# Patient Record
Sex: Male | Born: 1950 | Race: White | Hispanic: No | Marital: Married | Smoking: Former smoker
Health system: Southern US, Community
[De-identification: ages and names within clinical notes are randomized; demographics above are authoritative.]

## PROBLEM LIST (undated history)

## (undated) DIAGNOSIS — I10 Essential (primary) hypertension: Secondary | ICD-10-CM

## (undated) DIAGNOSIS — K635 Polyp of colon: Secondary | ICD-10-CM

## (undated) DIAGNOSIS — K760 Fatty (change of) liver, not elsewhere classified: Secondary | ICD-10-CM

## (undated) DIAGNOSIS — D689 Coagulation defect, unspecified: Secondary | ICD-10-CM

## (undated) DIAGNOSIS — E785 Hyperlipidemia, unspecified: Secondary | ICD-10-CM

## (undated) DIAGNOSIS — I2510 Atherosclerotic heart disease of native coronary artery without angina pectoris: Secondary | ICD-10-CM

## (undated) DIAGNOSIS — H269 Unspecified cataract: Secondary | ICD-10-CM

## (undated) DIAGNOSIS — K648 Other hemorrhoids: Secondary | ICD-10-CM

## (undated) DIAGNOSIS — R945 Abnormal results of liver function studies: Principal | ICD-10-CM

## (undated) DIAGNOSIS — R55 Syncope and collapse: Secondary | ICD-10-CM

## (undated) DIAGNOSIS — D649 Anemia, unspecified: Secondary | ICD-10-CM

## (undated) DIAGNOSIS — I214 Non-ST elevation (NSTEMI) myocardial infarction: Secondary | ICD-10-CM

## (undated) DIAGNOSIS — R7989 Other specified abnormal findings of blood chemistry: Secondary | ICD-10-CM

## (undated) DIAGNOSIS — K5790 Diverticulosis of intestine, part unspecified, without perforation or abscess without bleeding: Secondary | ICD-10-CM

## (undated) DIAGNOSIS — I4891 Unspecified atrial fibrillation: Secondary | ICD-10-CM

## (undated) DIAGNOSIS — M1990 Unspecified osteoarthritis, unspecified site: Secondary | ICD-10-CM

## (undated) DIAGNOSIS — K921 Melena: Secondary | ICD-10-CM

## (undated) HISTORY — DX: Fatty (change of) liver, not elsewhere classified: K76.0

## (undated) HISTORY — PX: COLONOSCOPY: SHX174

## (undated) HISTORY — DX: Syncope and collapse: R55

## (undated) HISTORY — DX: Melena: K92.1

## (undated) HISTORY — PX: WISDOM TOOTH EXTRACTION: SHX21

## (undated) HISTORY — PX: MOUTH SURGERY: SHX715

## (undated) HISTORY — DX: Non-ST elevation (NSTEMI) myocardial infarction: I21.4

## (undated) HISTORY — DX: Unspecified atrial fibrillation: I48.91

## (undated) HISTORY — DX: Polyp of colon: K63.5

## (undated) HISTORY — DX: Atherosclerotic heart disease of native coronary artery without angina pectoris: I25.10

## (undated) HISTORY — DX: Unspecified cataract: H26.9

## (undated) HISTORY — DX: Anemia, unspecified: D64.9

## (undated) HISTORY — DX: Essential (primary) hypertension: I10

## (undated) HISTORY — DX: Other hemorrhoids: K64.8

## (undated) HISTORY — DX: Other specified abnormal findings of blood chemistry: R79.89

## (undated) HISTORY — DX: Unspecified osteoarthritis, unspecified site: M19.90

## (undated) HISTORY — DX: Diverticulosis of intestine, part unspecified, without perforation or abscess without bleeding: K57.90

## (undated) HISTORY — PX: ESOPHAGOGASTRODUODENOSCOPY ENDOSCOPY: SHX5814

## (undated) HISTORY — DX: Abnormal results of liver function studies: R94.5

## (undated) HISTORY — DX: Coagulation defect, unspecified: D68.9

## (undated) HISTORY — DX: Hyperlipidemia, unspecified: E78.5

---

## 2011-03-17 ENCOUNTER — Inpatient Hospital Stay (HOSPITAL_COMMUNITY)
Admission: EM | Admit: 2011-03-17 | Discharge: 2011-03-19 | DRG: 121 | Disposition: A | Discharge status: Home / Self Care | Payer: BC Managed Care – PPO | Source: Ambulatory Visit | Attending: Internal Medicine | Admitting: Internal Medicine

## 2011-03-17 ENCOUNTER — Emergency Department (HOSPITAL_COMMUNITY): Payer: BC Managed Care – PPO

## 2011-03-17 ENCOUNTER — Inpatient Hospital Stay (HOSPITAL_COMMUNITY): Payer: BC Managed Care – PPO

## 2011-03-17 DIAGNOSIS — R7989 Other specified abnormal findings of blood chemistry: Secondary | ICD-10-CM | POA: Diagnosis present

## 2011-03-17 DIAGNOSIS — I10 Essential (primary) hypertension: Secondary | ICD-10-CM | POA: Diagnosis present

## 2011-03-17 DIAGNOSIS — E785 Hyperlipidemia, unspecified: Secondary | ICD-10-CM | POA: Diagnosis present

## 2011-03-17 DIAGNOSIS — R197 Diarrhea, unspecified: Secondary | ICD-10-CM | POA: Diagnosis present

## 2011-03-17 DIAGNOSIS — I4891 Unspecified atrial fibrillation: Secondary | ICD-10-CM | POA: Diagnosis present

## 2011-03-17 DIAGNOSIS — K649 Unspecified hemorrhoids: Secondary | ICD-10-CM | POA: Diagnosis present

## 2011-03-17 DIAGNOSIS — I2510 Atherosclerotic heart disease of native coronary artery without angina pectoris: Secondary | ICD-10-CM | POA: Diagnosis present

## 2011-03-17 DIAGNOSIS — R55 Syncope and collapse: Secondary | ICD-10-CM

## 2011-03-17 DIAGNOSIS — I214 Non-ST elevation (NSTEMI) myocardial infarction: Principal | ICD-10-CM | POA: Diagnosis present

## 2011-03-17 LAB — CBC
HCT: 43.3 % (ref 39.0–52.0)
Hemoglobin: 15.4 g/dL (ref 13.0–17.0)
MCH: 29.5 pg (ref 26.0–34.0)
MCH: 29.1 pg (ref 26.0–34.0)
MCHC: 34.6 g/dL (ref 30.0–36.0)
MCV: 84 fL (ref 78.0–100.0)
Platelets: 205 10*3/uL (ref 150–400)
Platelets: 214 10*3/uL (ref 150–400)
RBC: 4.7 MIL/uL (ref 4.22–5.81)
RDW: 13.3 % (ref 11.5–15.5)
RDW: 13.2 % (ref 11.5–15.5)
WBC: 12.9 10*3/uL — ABNORMAL HIGH (ref 4.0–10.5)
WBC: 15.2 10*3/uL — ABNORMAL HIGH (ref 4.0–10.5)

## 2011-03-17 LAB — POCT I-STAT TROPONIN I

## 2011-03-17 LAB — CK TOTAL AND CKMB (NOT AT ARMC)
CK, MB: 27.3 ng/mL (ref 0.3–4.0)
Relative Index: 8.4 — ABNORMAL HIGH (ref 0.0–2.5)
Total CK: 324 U/L — ABNORMAL HIGH (ref 7–232)

## 2011-03-17 LAB — HEPATIC FUNCTION PANEL
ALT: 126 U/L — ABNORMAL HIGH (ref 0–53)
AST: 141 U/L — ABNORMAL HIGH (ref 0–37)
Total Protein: 7.9 g/dL (ref 6.0–8.3)

## 2011-03-17 LAB — PROTIME-INR: INR: 1.1 (ref 0.00–1.49)

## 2011-03-17 LAB — DIFFERENTIAL
Basophils Absolute: 0 10*3/uL (ref 0.0–0.1)
Lymphocytes Relative: 16 % (ref 12–46)
Neutro Abs: 10 10*3/uL — ABNORMAL HIGH (ref 1.7–7.7)

## 2011-03-17 LAB — BASIC METABOLIC PANEL
Chloride: 101 mEq/L (ref 96–112)
GFR calc Af Amer: 72 mL/min — ABNORMAL LOW (ref >90)
Potassium: 3.7 mEq/L (ref 3.5–5.1)
Sodium: 138 mEq/L (ref 135–145)

## 2011-03-17 LAB — CARDIAC PANEL(CRET KIN+CKTOT+MB+TROPI)
CK, MB: 60.8 ng/mL (ref 0.3–4.0)
Total CK: 562 U/L — ABNORMAL HIGH (ref 7–232)
Troponin I: 16.79 ng/mL (ref <0.30)

## 2011-03-17 LAB — LIPID PANEL
HDL: 29 mg/dL — ABNORMAL LOW (ref >39)
LDL Cholesterol: 199 mg/dL — ABNORMAL HIGH (ref 0–99)

## 2011-03-17 LAB — ETHANOL
Alcohol, Ethyl (B): <11 mg/dL (ref 0–11)
Alcohol, Ethyl (B): <11 mg/dL (ref 0–11)

## 2011-03-18 DIAGNOSIS — I2510 Atherosclerotic heart disease of native coronary artery without angina pectoris: Secondary | ICD-10-CM

## 2011-03-18 HISTORY — PX: CARDIAC CATHETERIZATION: SHX172

## 2011-03-18 LAB — CBC
HCT: 39.5 % (ref 39.0–52.0)
Hemoglobin: 13.5 g/dL (ref 13.0–17.0)
MCH: 29.2 pg (ref 26.0–34.0)
MCHC: 34.2 g/dL (ref 30.0–36.0)
MCV: 85.3 fL (ref 78.0–100.0)

## 2011-03-18 NOTE — Cardiovascular Report (Signed)
NAMEMITESH, ROSENDAHL                 ACCOUNT NO.:  [id number]  MEDICAL RECORD NO.:  [id number]  LOCATION:  3704                         FACILITY:  [facility]  PHYSICIAN:  Verne Carrow, MDDATE OF BIRTH:  1950/09/24  DATE OF PROCEDURE:  03/18/2011 DATE OF DISCHARGE:                           CARDIAC CATHETERIZATION   PRIMARY CARDIOLOGIST:  Doylene Canning. Ladona Ridgel, MD  PROCEDURE PERFORMED: 1. Left heart catheterization. 2. Selective coronary angiography. 3. Left ventricular angiogram.  OPERATOR:  Verne Carrow, MD  INDICATION:  This is a 60 year old Caucasian male with no significant past medical history who presented to the hospital yesterday after a syncopal episode.  The patient was found to be in atrial fibrillation with rapid ventricular response.  The patient has also complained of diarrhea over the last several days.  The patient was converted to normal sinus rhythm with intravenous Cardizem.  He was not aware of any irregularity of his heart rhythm.  The patient has had no complaints of chest pain or shortness of breath.  He did rule in for a myocardial infarction with serial cardiac enzymes.  His troponin peak was 16. Diagnostic catheterization was arranged for today.  DETAILS OF PROCEDURE:  The patient was brought to the main cardiac catheterization laboratory after signing informed consent for the procedure.  An Freida Busman test was performed on the right wrist and was positive.  The right wrist was prepped and draped in sterile fashion. Lidocaine 1% was used for local anesthesia.  A 5-French sheath was inserted without difficulty into the right radial artery.  Verapamil 3 mg was given through the sheath after insertion.  Intravenous heparin 4000 units was given after sheath insertion.  Standard diagnostic catheters were used to perform selective coronary angiography.  Pigtail catheter was used to perform a left ventricular angiogram.  The patient tolerated the procedure  well.  The sheath was removed from the right radial artery and a Terumo hemostasis band was applied to the arteriotomy site.  The patient was taken to the recovery area in stable condition.  HEMODYNAMIC FINDINGS:  Central aortic pressure 130/82, left ventricular pressure 121/14/22.  ANGIOGRAPHIC FINDINGS: 1. The left main coronary artery had no evidence of disease. 2. The left anterior descending was a large vessel that coursed to the     apex.  There was a moderate-sized diagonal branch that had 30%     plaque.  The mid and distal LAD beyond the diagonal branch became     smaller in caliber and had no significant disease. 3. The circumflex artery had mild plaque in its proximal portion.     There was a large marginal branch that had 30% plaque.  The second     marginal branch was moderate sized and had 30% plaque.  The AV     groove circumflex was relatively small in caliber beyond the     takeoff of the second marginal branch and had no disease. 4. The right coronary artery is a large dominant vessel with 30%     proximal plaque. 5. Left ventricular angiogram was performed in the RAO projection and     showed mild left ventricular systolic dysfunction with hypokinesis  of the anterolateral wall and apex.  IMPRESSION: 1. Mild nonobstructive coronary artery disease. 2. Non-ST elevation myocardial infarction in the setting of atrial     fibrillation with rapid ventricular response.  I do not see any     focal coronary stenoses that would explain his elevated troponin.     He does have a wall motion abnormality in the anteroapical wall.     It could be secondary to myocarditis versus coronary thrombus that     has since resolved versus stress-induced cardiomyopathy.  At this     time, we will treat him as if he is in acute coronary syndrome with     aspirin, Plavix, beta-blocker, and a statin.  In regards to his     atrial fibrillation, I do not think that he will require  Coumadin     therapy at this time.  He is currently in sinus rhythm.  We will     watch him closely for 24 or more hours.  We will probably discharge     him tomorrow.  He can follow up with Dr. Ladona Ridgel in regards to his     atrial fibrillation.     Verne Carrow, MD     CM/MEDQ  D:  03/18/2011  T:  03/18/2011  Job:  [id number]  cc:   Doylene Canning. Ladona Ridgel, MD  Electronically Signed by Verne Carrow MD on 03/18/2011 11:57:24 AM

## 2011-03-19 DIAGNOSIS — I219 Acute myocardial infarction, unspecified: Secondary | ICD-10-CM

## 2011-03-19 HISTORY — PX: TRANSTHORACIC ECHOCARDIOGRAM: SHX275

## 2011-03-19 LAB — HEPATITIS PANEL, ACUTE: Hep A IgM: NEGATIVE

## 2011-03-19 LAB — BASIC METABOLIC PANEL
BUN: 13 mg/dL (ref 6–23)
Chloride: 103 mEq/L (ref 96–112)
Creatinine, Ser: 1.21 mg/dL (ref 0.50–1.35)
GFR calc Af Amer: 74 mL/min — ABNORMAL LOW (ref >90)
GFR calc non Af Amer: 64 mL/min — ABNORMAL LOW (ref >90)
Glucose, Bld: 102 mg/dL — ABNORMAL HIGH (ref 70–99)
Potassium: 3.9 mEq/L (ref 3.5–5.1)

## 2011-03-19 LAB — CBC
HCT: 44.2 % (ref 39.0–52.0)
Hemoglobin: 14.4 g/dL (ref 13.0–17.0)
MCHC: 32.6 g/dL (ref 30.0–36.0)
MCV: 87 fL (ref 78.0–100.0)
RDW: 13.2 % (ref 11.5–15.5)

## 2011-03-19 LAB — HEPATIC FUNCTION PANEL
ALT: 111 U/L — ABNORMAL HIGH (ref 0–53)
AST: 88 U/L — ABNORMAL HIGH (ref 0–37)
Bilirubin, Direct: <0.1 mg/dL (ref 0.0–0.3)
Total Bilirubin: 0.4 mg/dL (ref 0.3–1.2)

## 2011-03-21 NOTE — Discharge Summary (Signed)
Timothy Estes, Timothy Estes NO.:  [id number]  MEDICAL RECORD NO.:  [id number]  LOCATION:  2502                         FACILITY:  [facility]  PHYSICIAN:  Luis Abed, MD, FACCDATE OF BIRTH:  07/12/1950  DATE OF ADMISSION:  03/17/2011 DATE OF DISCHARGE:  03/19/2011                              DISCHARGE SUMMARY  PRIMARY CARDIOLOGIST: Dr. Lewayne Bunting   REASON FOR ADMISSION:  Syncope and elevated cardiac markers in the setting of atrial fibrillation with rapid ventricular rate.  DISCHARGE DIAGNOSES: 1. Non-ST-elevation myocardial infarction. 2. a.  Thought to be secondary to possible coronary embolus from     atrial fibrillation - possible myocarditis. 3. Nonobstructive coronary artery disease by cardiac catheterization     this admission. 4. Mild left ventricular dysfunction with an ejection fraction of 40%-     45% by cardiac catheterizations this admission. 5. Paroxysmal atrial fibrillation. 6. a.  Converted to normal sinus rhythm this admission. 7. Elevated LFTs. 8. a.  Abdominal ultrasound this admission:  Increased echogenicity of     the liver, likely representing steatosis, otherwise no acute     abnormalities. 9. Hypertension. 10.Hyperlipidemia. 11. Hematochezia likely secondary to hemorrhoidal bleeding  PROCEDURES PERFORMED THIS ADMISSION:  Cardiac catheterization by Dr. Verne Carrow, March 18, 2011:  EF 40%-45% with anterolateral hypokinesis, LAD with proximal plaque, OM-1 30%, proximal RCA 30%.  ADMISSION HISTORY:  Timothy Estes is a 60 year old male who presented to the emergency room after suffering a syncopal episode on his way to the bathroom during the middle of the night.  He had some diarrhea.  EMS was summoned, and he was noted to be in atrial fibrillation with rapid ventricular rate.  He had more diarrhea in the emergency room with some blood in it.  He does have a history of hemorrhoidal bleeding.  He was placed on IV Cardizem, and  he converted to normal sinus rhythm.  His cardiac markers were noted to be elevated with a troponin of 2.82.  He was admitted for further evaluation and treatment.  HOSPITAL COURSE:  The patient's cardiac enzymes continued to rise, ruling him in for a non-ST-elevation myocardial infarction.  Peak troponin was 16.79.    He was noted to have elevated LFTs with a bilirubin of 0.4, AST 141, and ALT 126.  He did undergo an abdominal ultrasound that demonstrated findings consistent with steatosis, but no other acute process or explanation for elevated LFTs.    He underwent cardiac catheterization by Dr. Clifton James on March 18, 2011.   As noted above, he had nonobstructive disease.  He had mild LV dysfunction.   He had anterolateral hypokinesis.  It was felt that his non-ST-elevation myocardial infarction is possibly secondary to coronary embolus from his atrial fibrillation.  It was also thought he may have possible myocarditis.  It was felt that troponin was too high for Tako-tsubo cardiomyopathy.  He was placed on aspirin and Plavix for his ACS.  He was also placed on beta-blocker and statin.    However, at discharge, with his elevated liver enzyme it was felt that his statin should be held.  He will have followup liver function tests as well as  acute hepatitis panel drawn before discharge today.  We will also recheck LFTs in the office next week.  If his LFTs normalize, we will try to initiate statin therapy as his LDL was significantly elevated.    Of note, Dr. Clifton James felt that the patient should follow up with Dr. Ladona Ridgel for  further management of his atrial fibrillation.  He remains in sinus rhythm.  At  this point, it was not felt that Coumadin should be initiated.  He was evaluated by Dr. Myrtis Ser on March 19, 2011 and felt to be stable enough for discharge to home.    LABORATORIES AND X-RAY DATA:  Hemoglobin 14.4.  Sodium 138, potassium 3.9, creatinine 1.21.  Total bilirubin  0.4, direct bilirubin less than 0.1, alkaline phosphatase 72, AST 141, ALT 126, total protein 10.9, albumin 3.6, calcium 9.5, peak CK-MB 60.8, peak troponin-I 16.79.  Total cholesterol 277, triglycerides 247, HDL 29, LDL 199.  TSH 1.763. Abdominal ultrasound as noted above demonstrated findings consistent with steatosis, but no other significant findings.  Chest x-ray: Borderline cardiomegaly.  No acute cardiopulmonary process.  Head CT upon presentation to the ER demonstrated no evidence of traumatic intracranial injury or fracture.  CT of the cervical spine demonstrated no evidence of fracture or subluxation.  There was chronic osseous fusion at C5-C6 upon anterior and posterior, disk osteophyte complex is noted along the cervical spine, narrowing of the spinal canal 6 mm in maximal AP dimension on sagittal images at C5-C6 due to osteophytes and mild calcification in the carotid bifurcations bilaterally.  DISCHARGE MEDICATIONS: 1. Acetaminophen 325 mg 2 tablets every 4 hours as needed. 2. Aspirin 81 mg daily. 3. Clopidogrel 75 mg daily. 4. Metoprolol 25 mg twice daily. 5. Nitroglycerin p.r.n. chest pain. 6. Tums as needed. He has been advised to stop taking Aleve for now.  ALLERGIES:  No known drug allergies.  ACTIVITY:  He is to increase activity slowly.  No lifting or sexual activity for 2 weeks.  No driving for 1 week.  He may shower.  DIET:  Low-fat, low-sodium diet.  WOUND CARE:  He should call our office for any swelling, bleeding, bruising or fever.  FOLLOWUP: 1. Followup will be with Dr. Ladona Ridgel or the physician assistant in 2     weeks and our office will contact with an appointment. 2. A 2-D echocardiogram will be arranged as an outpatient and the     office will contact him with an appointment to reassess his LV     function 3. Followup liver function test will be arranged in the office early     next week, and he will be contacted with an appointment.  Total  physician PA time greater than 30 minutes since discharge.     Tereso Newcomer, PA-C   ______________________________ Luis Abed, MD, [facility]    SW/MEDQ  D:  03/19/2011  T:  03/19/2011  Job:  [id number]  Electronically Signed by Tereso Newcomer PA-C on 03/20/2011 02:34:31 PM Electronically Signed by Willa Rough MD FACC on 03/21/2011 01:23:45 PM

## 2011-03-21 NOTE — Discharge Summary (Signed)
  NAMEBAYLOR, CORTEZ                 ACCOUNT NO.:  [id number]  MEDICAL RECORD NO.:  [id number]  LOCATION:  2502                         FACILITY:  [facility]  PHYSICIAN:  Luis Abed, MD, FACCDATE OF BIRTH:  November 03, 1950  DATE OF ADMISSION:  03/17/2011 DATE OF DISCHARGE:  03/19/2011                              DISCHARGE SUMMARY   ADDENDUM:  The patient is actually getting his echo cardiogram prior to discharge today.  Therefore it will be done in the hospital and we will follow up on the results when he returns to office in followup.     Tereso Newcomer, PA-C   ______________________________ Luis Abed, MD, [facility]    SW/MEDQ  D:  03/19/2011  T:  03/19/2011  Job:  [id number]  Electronically Signed by Tereso Newcomer PA-C on 03/20/2011 02:29:39 PM Electronically Signed by Willa Rough MD FACC on 03/21/2011 01:23:41 PM

## 2011-03-25 ENCOUNTER — Ambulatory Visit (INDEPENDENT_AMBULATORY_CARE_PROVIDER_SITE_OTHER): Payer: BC Managed Care – PPO | Admitting: *Deleted

## 2011-03-25 DIAGNOSIS — E7800 Pure hypercholesterolemia, unspecified: Secondary | ICD-10-CM

## 2011-03-25 LAB — HEPATIC FUNCTION PANEL
ALT: 92 U/L — ABNORMAL HIGH (ref 0–53)
Albumin: 3.9 g/dL (ref 3.5–5.2)
Alkaline Phosphatase: 60 U/L (ref 39–117)
Bilirubin, Direct: 0 mg/dL (ref 0.0–0.3)
Total Protein: 8.5 g/dL — ABNORMAL HIGH (ref 6.0–8.3)

## 2011-03-27 NOTE — H&P (Signed)
Timothy Estes, Timothy Estes                 ACCOUNT NO.:  [id number]  MEDICAL RECORD NO.:  [id number]  LOCATION:  3703                         FACILITY:  [facility]  PHYSICIAN:  Doylene Canning. Ladona Ridgel, MD    DATE OF BIRTH:  1951-04-10  DATE OF ADMISSION:  03/17/2011 DATE OF DISCHARGE:                             HISTORY & PHYSICAL   PRIMARY CARE PHYSICIAN:  None.  PRIMARY CARDIOLOGIST:  None.  CHIEF COMPLAINT:  Syncope with diarrhea.  HPI:  Timothy Estes is a 60 year old male with no previous cardiac issues. He was in his usual state of health yesterday.  Last night after going to bed early he got up to the bathroom.  When he tried to get up, he slipped off the bed on to the floor.  He remembers doing this and just remembers feeling very weak and like he was going to pass out.  He improved somewhat so he got up and walked to the bathroom.  There he had a syncopal episode and according to his wife was in and out of consciousness.  He also had an episode of diarrhea at that time.  His wife called 9-1-1.  He was transported by EMS and was in AFib with RVR. He was confused and according to his wife kept repeating the same questions over and over again such as what happened.  He was oriented to name and place.  He was in AFib with rapid ventricular response in the emergency room.  He had another episode of diarrhea there as well. There was a small amount of bright red blood in it.  At no time did Timothy Estes complained of chest pain, shortness of breath or have palpitations.  He was placed on IV Cardizem and his heart rate control improved.  He then converted spontaneously to sinus rhythm.  At no time was he aware of an irregular heart rate.  His cardiac enzymes were elevated and Cardiology was asked to evaluate him.  Currently in the emergency room, his heart rate is sinus rhythm, rate in the 70s and 80s, and he is resting comfortably.  PAST MEDICAL HISTORY:  His last physical was in 1997.  He does get  DOT physicals yearly.  They check his cholesterol every other year and 2 years ago he thinks it was okay.  He denies any history of diabetes, hypertension, hyperlipidemia or family history of premature coronary artery disease.  He has no ongoing medical problems of which he is aware.  SURGICAL HISTORY:  None.  ALLERGIES:  None.  CURRENT MEDICATIONS:  Aleve and Tums p.r.n.  SOCIAL HISTORY:  He lives in [place] with his wife.  He works as a [employer], counselling.  He smoked for few years, but quit back in 1976.  He denies any history of alcohol or drug abuse.  FAMILY HISTORY:  Both of his parents were in their mid 71s when they died and neither of his parents nor any siblings have/had any cardiac issues.  REVIEW OF SYSTEMS:  He was diaphoretic at the time of his syncope.  He gets occasional reflux symptoms, but they resolve quickly with Tums.  He has bright red blood per rectum about  once a week, but it is a very small amount.  He has a history of hemorrhoids.  He denies any history of chest pain, shortness of breath, or palpitations.  Until today he had not been having any nausea, vomiting, or diarrhea.  He has occasional arthralgias or joint pains.  Full 14-point review of systems is otherwise negative except as stated in the HPI.  PHYSICAL EXAMINATION:  VITAL SIGNS:  Temperature is 97.3, blood pressure 149/95, heart rate 106, respiratory rate 11, O2 saturation 97% on room air. GENERAL:  He is a well-developed, well-nourished, white male in no acute distress. HEENT:  Normal for age. NECK:  There is no lymphadenopathy, thyromegaly, bruit or JVD noted. CV:  His heart is regular in rate and rhythm with an S1, S2 and no significant murmur, rub, or gallop is noted.  Distal pulses are intact in all four extremities. LUNGS:  Clear to auscultation bilaterally. SKIN:  No rashes or lesions are noted. ABDOMEN:  Soft and nontender with active bowel sounds. EXTREMITIES:  There  is no cyanosis, clubbing, or edema noted. RECTAL:  Stool guaiac was not performed, but per the emergency room physician red blood was obvious in his bowel movement. MUSCULOSKELETAL:  There is no joint deformity or effusions and no spine or CVA tenderness. NEUROLOGIC:  He is alert and oriented.  Cranial nerves II through XII grossly intact.  DIAGNOSTIC STUDIES:  Chest x-ray shows borderline cardiomegaly, but no acute cardiopulmonary process.  EKG initially atrial fibrillation, heart rate 95 with diffuse upsloping ST depression in the inferolateral leads.  Repeat EKG shows sinus rhythm with again diffuse inferolateral ST-depression.  LABORATORY VALUES:  Hemoglobin 15.4, hematocrit 43.3, WBCs 12.9, platelets 209, INR 1.0.  Sodium 138, potassium 3.7, chloride 101, CO2 of 24, BUN 17, creatinine 1.24, glucose 117, calcium 10.1.  Initial CK-MB 324/27.3 with a troponin of 2.82.  Initial point of care troponin 1.21. ETOH less than 11.  IMPRESSION:  Timothy Estes was seen today by Dr. Ladona Ridgel, the patient evaluated and the data reviewed.  ASSESSMENT: 1. Syncope. 2. New paroxysmal atrial fibrillation. 3. Elevated cardiac enzymes without acute ischemic changes (no ST-     elevations seen). 4. Diarrhea with likely hemorrhoidal bleeding.  RECOMMENDATIONS: 1. Admit. 2. Keep on telemetry. 3. Continue to cycle cardiac enzymes. 4. Check C. diff with diarrhea history. 5. Likely left heart catheterization needed with significant enzyme     elevation and we will schedule this for a.m. 6. Add beta-blocker, aspirin and heparin to his medication regimen.     Theodore Demark, PA-C   ______________________________ Doylene Canning. Ladona Ridgel, MD    RB/MEDQ  D:  03/17/2011  T:  03/17/2011  Job:  [id number]  Electronically Signed by Theodore Demark PA-C on 03/26/2011 09:45:39 PM Electronically Signed by Lewayne Bunting MD on 03/26/2011 11:58:12 PM

## 2011-03-31 ENCOUNTER — Encounter: Payer: Self-pay | Admitting: *Deleted

## 2011-04-01 ENCOUNTER — Other Ambulatory Visit: Payer: BC Managed Care – PPO | Admitting: *Deleted

## 2011-04-01 ENCOUNTER — Encounter: Payer: Self-pay | Admitting: Physician Assistant

## 2011-04-01 ENCOUNTER — Ambulatory Visit (INDEPENDENT_AMBULATORY_CARE_PROVIDER_SITE_OTHER): Payer: BC Managed Care – PPO | Admitting: Physician Assistant

## 2011-04-01 ENCOUNTER — Encounter: Payer: Self-pay | Admitting: Gastroenterology

## 2011-04-01 DIAGNOSIS — I10 Essential (primary) hypertension: Secondary | ICD-10-CM | POA: Insufficient documentation

## 2011-04-01 DIAGNOSIS — I214 Non-ST elevation (NSTEMI) myocardial infarction: Secondary | ICD-10-CM | POA: Insufficient documentation

## 2011-04-01 DIAGNOSIS — I4891 Unspecified atrial fibrillation: Secondary | ICD-10-CM | POA: Insufficient documentation

## 2011-04-01 DIAGNOSIS — R7989 Other specified abnormal findings of blood chemistry: Secondary | ICD-10-CM

## 2011-04-01 DIAGNOSIS — E785 Hyperlipidemia, unspecified: Secondary | ICD-10-CM

## 2011-04-01 DIAGNOSIS — I2510 Atherosclerotic heart disease of native coronary artery without angina pectoris: Secondary | ICD-10-CM

## 2011-04-01 HISTORY — PX: OTHER SURGICAL HISTORY: SHX169

## 2011-04-01 LAB — HEPATIC FUNCTION PANEL
Albumin: 3.6 g/dL (ref 3.5–5.2)
Total Protein: 7.9 g/dL (ref 6.0–8.3)

## 2011-04-01 NOTE — Assessment & Plan Note (Signed)
As noted, may have been from embolus related to Afib vs myocarditis.  Non-obstructive CAD at [facility].  Continue ASA and Plavix.  Follow up with Dr. Ladona Ridgel in 4-6 weeks.

## 2011-04-01 NOTE — Patient Instructions (Addendum)
Your physician recommends that you schedule a follow-up appointment in: 4-6 WEEKS WITH DR. Ladona Ridgel AS PER SCOTT WEAVER, PA-C; ON THE SAME DAY PT IS TO HAVE FASTING LIPID PANEL 272.4 HYPERLIPIDEMIA  Your physician recommends that you return for lab work in: TODAY LFT 272.4 HYPERLIPIDEMIA  You have been referred to [facility] GASTROENTEROLOGY FOR ELEVATED LIVER ENZYMES  Your physician has recommended that you wear an event monitor DX 427.31 AFIB. Event monitors are medical devices that record the heart's electrical activity. Doctors most often [place] these monitors to diagnose arrhythmias. Arrhythmias are problems with the speed or rhythm of the heartbeat. The monitor is a small, portable device. You can wear one while you do your normal daily activities. This is usually used to diagnose what is causing palpitations/syncope (passing out).

## 2011-04-01 NOTE — Progress Notes (Signed)
History of Present Illness: PCP:  None - trying to see Dr. Katrinka Blazing at [facility]  Primary Cardiologist:  Dr. Lewayne Bunting   Timothy Estes is a 60 y.o. male who presents for post hospital follow up.  He was admitted 10/25-10/27.  Presented with syncope.  Noted to be in AFib with RVR by EMS and converted to NSR with IV diltiazem in the ED.  Ruled in for NSTEMI and LHC 03/17/11: Dx 30%, OM1 30%, OM2 30%, pRCA 30%, EF 40-45% with anterolat and apical HK.  He complained of diarrhea and was noted to have some BRBPR that was felt to be hemorrhoidal bleeding.  LFTs were noted to be elevated and abdominal u/s was notable for hepatic steatosis only.  His statin was d/c in the setting of elevated LFTs.  It was felt that his NSTEMI was likely the result of embolus from his AFib vs possible myocarditis.  He required Plavix and ASA for his NSTEMI and it was not felt that he required coumadin at this time.  Echo done at d/c 03/19/11: mild LVH, EF 60%, normal wall motion.    Labs (02/2011): AST 141 ==> 88 ==> 71 Labs (02/2011): ALT 126 ==> 111==> 92 Labs (02/2011): TC 277, TG 247, HDL 29, LDL 199, Hgb 14.4, K 3.9, creatinine 1.21, TSH 1.763.  The patient denies chest pain, shortness of breath, syncope, orthopnea, PND or significant pedal edema.  No palpitations.  States he feels better than he has in a long time.  Past Medical History  Diagnosis Date  . Syncope     in setting of AFib with RVR  . Atrial fibrillation with RVR     converted to NSR with IV dilt 10/12 - no coumadin due to need for Plavix and ASA  . Coronary artery disease     LHC 03/17/11: Dx 30%, OM1 30%, OM2 30%, pRCA 30%, EF 40-45% with anterolat and apical HK  . Hypertension   . Hyperlipidemia   . NSTEMI (non-ST elevated myocardial infarction)     FLET THAT NON-ST-ELEVATION MI IS POSSIBLY SECONDARY TO CORONARY EMBOLUS FROM HIS AFIB  . Hematochezia     LIKEY FROM HEMORRHOIDAL BLEEDING; PT HAS H/O  . Elevated LFTs   . Fatty liver     Current  Outpatient Prescriptions  Medication Sig Dispense Refill  . aspirin EC 81 MG tablet Take 81 mg by mouth daily.        . calcium carbonate (TUMS - DOSED IN MG ELEMENTAL CALCIUM) 500 MG chewable tablet Chew 2 tablets by mouth 3 (three) times daily between meals as needed.        . clopidogrel (PLAVIX) 75 MG tablet Take 75 mg by mouth daily.        . metoprolol tartrate (LOPRESSOR) 25 MG tablet Take 25 mg by mouth 2 (two) times daily.        . nitroGLYCERIN (NITROSTAT) 0.4 MG SL tablet Place 0.4 mg under the tongue every 5 (five) minutes as needed.        Marland Kitchen acetaminophen (TYLENOL) 325 MG tablet Take 650 mg by mouth every 4 (four) hours as needed.          Allergies: No Known Allergies  History  Substance Use Topics  . Smoking status: Former Smoker    Quit date: 05/23/1974  . Smokeless tobacco: Not on file  . Alcohol Use: No     ROS:  Please see the history of present illness.   General ROS: negative for -  chills or fever Gastrointestinal ROS: negative for - abdominal pain, blood in stools or nausea/vomiting Musculoskeletal ROS: negative for - joint pain or muscle pain Dermatological ROS: negative for rash  All other systems reviewed and negative.   Vital Signs: BP 140/82  Pulse 78  Resp 18  Ht 5\' 10"  (1.778 m)  Wt 223 lb (101.152 kg)  BMI 32.00 kg/m2  PHYSICAL EXAM: Well nourished, well developed, in no acute distress HEENT: normal Neck: no JVD Vascular: no carotid bruits Cardiac:  normal S1, S2; RRR; no murmur Lungs:  clear to auscultation bilaterally, no wheezing, rhonchi or rales Abd: soft, nontender, no hepatomegaly Ext: no edema; right radial site without hematoma or bruit Skin: warm and dry Neuro:  CNs 2-12 intact, no focal abnormalities noted Psych: normal affect  EKG:  NSR, HR 67, LAD, PRWP, NSSTTW changes  ASSESSMENT AND PLAN:

## 2011-04-01 NOTE — Assessment & Plan Note (Signed)
Continue ASA and Plavix.  Hold off on statin for now with elevated LFTs.

## 2011-04-01 NOTE — Assessment & Plan Note (Signed)
Borderline control.  Continue current meds for now.

## 2011-04-01 NOTE — Assessment & Plan Note (Signed)
Maintaining NSR.  Remain on ASA and Plavix.  Currently CHADS2 score is 1.  Will arrange event monitor to assess for recurrent AFib.  Follow up with Dr. Ladona Ridgel in 4-6 weeks to review.

## 2011-04-01 NOTE — Assessment & Plan Note (Signed)
Arrange follow up fasting lipids.  If GI says ok to use statins, may need to have him see Lipid clinic.

## 2011-04-01 NOTE — Assessment & Plan Note (Addendum)
Hepatitis serologies were negative and abdominal u/s notable for steatosis.  Suspect elevated LFTs related to fatty liver.  Will repeat LFTs today and refer to GI.  He really needs to be on a statin with very high LDL and nonobs CAD.  I recommend formal evaluation with GI before trying to start this.  Refer to GI.

## 2011-04-04 ENCOUNTER — Telehealth: Payer: Self-pay | Admitting: Internal Medicine

## 2011-04-04 NOTE — Telephone Encounter (Addendum)
New message: Pt was seen on Friday, and was checking to see if there was a holter available..  Was told to call this am and check.

## 2011-04-05 ENCOUNTER — Telehealth: Payer: Self-pay | Admitting: Physician Assistant

## 2011-04-05 NOTE — Telephone Encounter (Signed)
Please tell patient that I discussed with the doctor he is going to see for GI soon regarding his cholesterol. He felt it was ok to start him on a statin. I would like him to start Lipitor 40 mg QD. He needs LFTs 2 weeks and 4 weeks after starting. Also schedule FLP and LFTs 6 weeks after starting. Tereso Newcomer, PA-C

## 2011-04-06 NOTE — Telephone Encounter (Signed)
Okey Regal Can you please call to see what he needs? May need Windell Moulding to answer his ques. Thanks [employer]

## 2011-04-06 NOTE — Telephone Encounter (Signed)
Pt returning call to our office regarding heart monitor please return pt call.

## 2011-04-07 MED ORDER — ATORVASTATIN CALCIUM 40 MG PO TABS: 40.0000 mg | ORAL_TABLET | Freq: Every day | ORAL | Status: DC

## 2011-04-07 NOTE — Telephone Encounter (Signed)
pt aware of lab result and to start on lipitor 40 mg qhs rx sent in to [facility] today pt aware, GI appt 11/29 and will have LFT then. Danielle Rankin

## 2011-04-08 NOTE — Telephone Encounter (Signed)
Ok Khole Branch, PA-C  

## 2011-04-21 ENCOUNTER — Other Ambulatory Visit: Payer: BC Managed Care – PPO

## 2011-04-21 ENCOUNTER — Other Ambulatory Visit (INDEPENDENT_AMBULATORY_CARE_PROVIDER_SITE_OTHER): Payer: BC Managed Care – PPO

## 2011-04-21 ENCOUNTER — Telehealth: Payer: Self-pay

## 2011-04-21 ENCOUNTER — Encounter: Payer: Self-pay | Admitting: Gastroenterology

## 2011-04-21 ENCOUNTER — Ambulatory Visit (INDEPENDENT_AMBULATORY_CARE_PROVIDER_SITE_OTHER): Payer: BC Managed Care – PPO | Admitting: Gastroenterology

## 2011-04-21 VITALS — BP 120/70 | HR 64 | Ht 70.0 in | Wt 222.6 lb

## 2011-04-21 LAB — FERRITIN: Ferritin: 50.5 ng/mL (ref 22.0–322.0)

## 2011-04-21 LAB — HEPATIC FUNCTION PANEL
AST: 52 U/L — ABNORMAL HIGH (ref 0–37)
Alkaline Phosphatase: 67 U/L (ref 39–117)
Bilirubin, Direct: 0.1 mg/dL (ref 0.0–0.3)
Total Bilirubin: 0.6 mg/dL (ref 0.3–1.2)

## 2011-04-21 LAB — IBC PANEL: Saturation Ratios: 10.1 % — ABNORMAL LOW (ref 20.0–50.0)

## 2011-04-21 NOTE — Progress Notes (Signed)
History of Present Illness: This is a 60 year old male with a non-STEMI in late October 2012 who is referred for elevated transaminases.  His abdominal ultrasound revealed only fatty liver. Liver enzymes in EPIC and abdominal ultrasound report reviewed. The AST/ ALT trend has been steadily improving from about 4-5 X normal to 1.5 X normal over the past month. He relates no prior history of liver disease, jaundice or known elevated liver enzymes. No family history of liver disease. Denies weight loss, abdominal pain, constipation, diarrhea, change in stool caliber, melena, hematochezia, nausea, vomiting, dysphagia, reflux symptoms, chest pain.  No Known Allergies Outpatient Prescriptions Prior to Visit  Medication Sig Dispense Refill  . aspirin EC 81 MG tablet Take 81 mg by mouth daily.        Marland Kitchen atorvastatin (LIPITOR) 40 MG tablet Take 1 tablet (40 mg total) by mouth daily.  30 tablet  11  . calcium carbonate (TUMS - DOSED IN MG ELEMENTAL CALCIUM) 500 MG chewable tablet Chew 2 tablets by mouth 3 (three) times daily between meals as needed.        . clopidogrel (PLAVIX) 75 MG tablet Take 75 mg by mouth daily.        . metoprolol tartrate (LOPRESSOR) 25 MG tablet Take 25 mg by mouth 2 (two) times daily.        . nitroGLYCERIN (NITROSTAT) 0.4 MG SL tablet Place 0.4 mg under the tongue every 5 (five) minutes as needed.        Marland Kitchen acetaminophen (TYLENOL) 325 MG tablet Take 650 mg by mouth every 4 (four) hours as needed.         Past Medical History  Diagnosis Date  . Syncope     in setting of AFib with RVR  . Atrial fibrillation with RVR     converted to NSR with IV dilt 10/12 - no coumadin due to need for Plavix and ASA  . Coronary artery disease     LHC 03/17/11: Dx 30%, OM1 30%, OM2 30%, pRCA 30%, EF 40-45% with anterolat and apical HK  . Hypertension   . Hyperlipidemia   . NSTEMI (non-ST elevated myocardial infarction)     FLET THAT NON-ST-ELEVATION MI IS POSSIBLY SECONDARY TO CORONARY EMBOLUS  FROM HIS AFIB  . Hematochezia     LIKEY FROM HEMORRHOIDAL BLEEDING; PT HAS H/O  . Elevated LFTs   . Fatty liver    Past Surgical History  Procedure Date  . Cardiac catheterization 03/18/11   History   Social History  . Marital Status: Married    Spouse Name: N/A    Number of Children: N/A  . 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 Smoking status: Former Smoker    Quit date: 05/23/1974  . Smokeless tobacco: Never Used  . Alcohol Use: No  . Drug Use: No  . Sexually Active: None   Other Topics Concern  . None   Social History Narrative  . None   No family history on file.    Review of Systems: Pertinent positive and negative review of systems were noted in the above HPI section. All other review of systems were otherwise negative.  Physical Exam: General: Well developed , well nourished, no acute distress Head: Normocephalic and atraumatic Eyes:  sclerae anicteric, EOMI Ears: Normal auditory acuity Mouth: No deformity or lesions Neck: Supple, no masses or thyromegaly Lungs: Clear throughout to auscultation Heart: Regular rate and rhythm;  no murmurs, rubs or bruits Abdomen: Soft, non tender and non distended. No masses, hepatosplenomegaly or hernias noted. Normal Bowel sounds Musculoskeletal: Symmetrical with no gross deformities  Skin: No lesions on visible extremities Pulses:  Normal pulses noted Extremities: No clubbing, cyanosis, edema or deformities noted Neurological: Alert oriented x 4, grossly nonfocal Cervical Nodes:  No significant cervical adenopathy Inguinal Nodes: No significant inguinal adenopathy Psychological:  Alert and cooperative. Normal mood and affect  Assessment and Recommendations:  1. Mildly elevated transaminases and hepatic steatosis. LFTs improving over the past month. Rule out other causes of elevated transaminases. Standard blood work ordered. Long term low fat diet with  weight loss and optimal management of lipids. His elevated LFTs are not likely to be medication side effects. It is appropriate to continue Lipitor and monitor his LFTs at regular intervals.  1. Colorectal cancer screening. Consider colonoscopy at 6 months from his MI, in April 2013, if it is appropriate to hold Plavix for 7 days at that time and is cleared by Dr. Ladona Ridgel.

## 2011-04-21 NOTE — Patient Instructions (Signed)
Go directly to the basement to have your labs drawn today. We have put your recall Colonoscopy in for April 2013. We will contact you then to schedule at that time.  cc: Lewayne Bunting, MD

## 2011-04-22 ENCOUNTER — Other Ambulatory Visit: Payer: Self-pay

## 2011-04-22 DIAGNOSIS — E611 Iron deficiency: Secondary | ICD-10-CM

## 2011-04-22 LAB — MITOCHONDRIAL ANTIBODIES: Mitochondrial M2 Ab, IgG: 0.26 (ref <0.91)

## 2011-04-22 LAB — CERULOPLASMIN: Ceruloplasmin: 34 mg/dL (ref 20–60)

## 2011-04-22 LAB — ANA: Anti Nuclear Antibody(ANA): NEGATIVE

## 2011-04-22 LAB — ALPHA-1-ANTITRYPSIN: A-1 Antitrypsin, Ser: 155 mg/dL (ref 90–200)

## 2011-04-22 NOTE — Telephone Encounter (Signed)
Patient called back and would like monitor mail out to her ,[employer] will send this monitor to patient.

## 2011-04-25 LAB — ANTI-SMOOTH MUSCLE ANTIBODY, IGG: Smooth Muscle Ab: 5 U (ref <20)

## 2011-04-29 ENCOUNTER — Ambulatory Visit (INDEPENDENT_AMBULATORY_CARE_PROVIDER_SITE_OTHER): Payer: BC Managed Care – PPO | Admitting: Internal Medicine

## 2011-04-29 ENCOUNTER — Encounter: Payer: Self-pay | Admitting: Internal Medicine

## 2011-04-29 DIAGNOSIS — I10 Essential (primary) hypertension: Secondary | ICD-10-CM

## 2011-04-29 DIAGNOSIS — I4891 Unspecified atrial fibrillation: Secondary | ICD-10-CM

## 2011-04-29 NOTE — Patient Instructions (Signed)
Follow up only if needed

## 2011-05-02 ENCOUNTER — Encounter: Payer: Self-pay | Admitting: Internal Medicine

## 2011-05-02 NOTE — Progress Notes (Signed)
HPI Mr. Brindle returns today for followup. He is a 60 year old man with a history of paroxysmal atrial fibrillation, hypertension, and dyslipidemia. The patient sustained a non-ST elevation MI several months ago. The etiology is unclear. He currently denies chest pain, shortness of breath, or peripheral edema. He denies palpitations No Known Allergies   Current Outpatient Prescriptions  Medication Sig Dispense Refill  . aspirin EC 81 MG tablet Take 81 mg by mouth daily.        Marland Kitchen atorvastatin (LIPITOR) 40 MG tablet Take 1 tablet (40 mg total) by mouth daily.  30 tablet  11  . calcium carbonate (TUMS - DOSED IN MG ELEMENTAL CALCIUM) 500 MG chewable tablet Chew 2 tablets by mouth 3 (three) times daily between meals as needed.        . clopidogrel (PLAVIX) 75 MG tablet Take 75 mg by mouth daily.        . metoprolol tartrate (LOPRESSOR) 25 MG tablet Take 25 mg by mouth 2 (two) times daily.        . nitroGLYCERIN (NITROSTAT) 0.4 MG SL tablet Place 0.4 mg under the tongue every 5 (five) minutes as needed.           Past Medical History  Diagnosis Date  . Syncope     in setting of AFib with RVR  . Atrial fibrillation with RVR     converted to NSR with IV dilt 10/12 - no coumadin due to need for Plavix and ASA  . Coronary artery disease     LHC 03/17/11: Dx 30%, OM1 30%, OM2 30%, pRCA 30%, EF 40-45% with anterolat and apical HK  . Hypertension   . Hyperlipidemia   . NSTEMI (non-ST elevated myocardial infarction)     FLET THAT NON-ST-ELEVATION MI IS POSSIBLY SECONDARY TO CORONARY EMBOLUS FROM HIS AFIB  . Hematochezia     LIKEY FROM HEMORRHOIDAL BLEEDING; PT HAS H/O  . Elevated LFTs   . Fatty liver     ROS:   All systems reviewed and negative except as noted in the HPI.   Past Surgical History  Procedure Date  . Cardiac catheterization 03/18/11  . Cardiac event monitor 04/01/11  . Transthoracic echocardiogram 03/19/11     Family History  Problem Relation Age of Onset  . Heart  disease Neg Hx      History   Social History  . Marital Status: Married    Spouse Name: N/A    Number of Children: N/A  . 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 Smoking status: Former Smoker    Quit date: 05/23/1974  . Smokeless tobacco: Never Used  . Alcohol Use: No  . Drug Use: No  . Sexually Active: Not on file   Other Topics Concern  . Not on file   Social History Narrative  . No narrative on file     BP 142/88  Pulse 62  Ht 5\' 10"  (1.778 m)  Wt 102.114 kg (225 lb 1.9 oz)  BMI 32.30 kg/m2  Physical Exam:  Well appearing middle-aged man, NAD HEENT: Unremarkable Neck:  No JVD, no thyromegally Lungs:  Clear with no wheezes, rales, or rhonchi. HEART:  Regular rate rhythm, no murmurs, no rubs, no clicks Abd:  soft, positive bowel sounds, no organomegally, no rebound, no guarding Ext:  2 plus pulses, no edema, no cyanosis, no clubbing Skin:  No rashes no nodules Neuro:  CN II through  XII intact, motor grossly intact   Assess/Plan:

## 2011-05-02 NOTE — Assessment & Plan Note (Signed)
The blood pressure is elevated somewhat today. I recommended he reduce his sodium intake. If it remains elevated, additional blood pressure lowering medications may be required or up titration of his beta blocker

## 2011-05-02 NOTE — Assessment & Plan Note (Signed)
He appears to be maintaining sinus rhythm. Because he is on Plavix and aspirin, incremental benefit of warfarin is likely very minimal and the bleeding risk is likely very high. The patient's CHADS score is one. He will continue his current medical therapy

## 2011-08-22 ENCOUNTER — Ambulatory Visit (INDEPENDENT_AMBULATORY_CARE_PROVIDER_SITE_OTHER): Payer: BC Managed Care – PPO | Admitting: Family Medicine

## 2011-08-22 ENCOUNTER — Encounter: Payer: Self-pay | Admitting: Family Medicine

## 2011-08-22 VITALS — BP 142/92 | HR 80 | Ht 70.0 in | Wt 223.0 lb

## 2011-08-22 DIAGNOSIS — R7989 Other specified abnormal findings of blood chemistry: Secondary | ICD-10-CM

## 2011-08-22 DIAGNOSIS — I10 Essential (primary) hypertension: Secondary | ICD-10-CM

## 2011-08-22 DIAGNOSIS — E782 Mixed hyperlipidemia: Secondary | ICD-10-CM

## 2011-08-22 MED ORDER — METOPROLOL TARTRATE 25 MG PO TABS: 25.0000 mg | ORAL_TABLET | Freq: Two times a day (BID) | ORAL | Status: DC

## 2011-08-22 NOTE — Patient Instructions (Signed)
For muscle aches, possibly contributed by your cholesterol medication--trial of Coenzyme Q10 (follow bottle's directions)  Knee pain--likely arthritis--trial of Tylenol Arthritis, and glucosamine/chondroitin combination (ie Osteo-biflex).  Continue to get regular exercise--swimming and exercise bicycle (with low tension, and seat high).  Losing weight will help with knee pain.  Continue to periodically check blood pressures at home, and follow up if they are running >140/90.

## 2011-08-22 NOTE — Progress Notes (Signed)
Chief complaint: New patient to get established. Has b/l knee pain x 2 years  HPI: Patient presents to establish care.  He has been under the care of the cardiologist since having atrial fibrillation in October.  He was recently released from Dr. Lubertha Basque care, and to follow up there "as needed".  Review of chart shows that his BP was a little elevated at his last visit with Dr. Ladona Ridgel, but patient has been monitoring BP at home, and has been fine. 124/82 this morning.  BP's always <130/82. At home.  His last lipids were elevated (in October).  Patient reports being compliant with his medications, and with his diet.  He was found at that time to also have elevated liver tests.  He has followed up with Dr. Russella Dar for further evaluation of his liver.  Was due to have CBC repeated in February, which wasn't done. His last doctor was in [place], hadn't seen him in about 5 years.  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  Has developed pain in his knees for the last 1.5 -2 years.  Describes his knees as being "sore" and slowing him down, not really painful.  +stiffness.  Stiff in morning, feels better once he gets moving.  Knees periodically will swell.  Also has some muscle pain in his thighs, maybe from his statin medication (not sure, but thinks the timing was right). He used to take Aleve, which helped just a little. Denies any fall/injury.  Past Medical History  Diagnosis Date  . Syncope     in setting of AFib with RVR  . Atrial fibrillation with RVR     converted to NSR with IV dilt 10/12 - no coumadin due to need for Plavix and ASA  . Coronary artery disease     LHC 03/17/11: Dx 30%, OM1 30%, OM2 30%, pRCA 30%, EF 40-45% with anterolat and apical HK  . Hypertension   . Hyperlipidemia   . NSTEMI (non-ST elevated myocardial infarction)     FLET THAT NON-ST-ELEVATION MI IS POSSIBLY SECONDARY TO CORONARY EMBOLUS FROM HIS AFIB  . Hematochezia     LIKEY FROM HEMORRHOIDAL BLEEDING; PT  HAS H/O  . Elevated LFTs   . Fatty liver     Past Surgical History  Procedure Date  . Cardiac catheterization 03/18/11  . Cardiac event monitor 04/01/11  . Transthoracic echocardiogram 03/19/11    History   Social History  . Marital Status: Married    Spouse Name: N/A    Number of Children: 0  . 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 Smoking status: Former Smoker    Quit date: 05/26/1974  . Smokeless tobacco: Never Used  . Alcohol Use: Yes     2 beers per week.  . Drug Use: No  . Sexually Active: Not on file   Other Topics Concern  . Not on file   Social History Narrative   Lives with wife, 1 dog.    Family History  Problem Relation Age of Onset  . Heart disease Neg Hx   . Cancer Neg Hx   . Hyperlipidemia Brother   . Hypertension Brother     Current outpatient prescriptions:aspirin EC 81 MG tablet, Take 81 mg by mouth daily.  , Disp: , Rfl: ;  atorvastatin (LIPITOR) 40 MG tablet, Take 1 tablet (40 mg total) by mouth daily., Disp: 30 tablet, Rfl: 11;  clopidogrel (PLAVIX)  75 MG tablet, Take 75 mg by mouth daily.  , Disp: , Rfl: ;  metoprolol tartrate (LOPRESSOR) 25 MG tablet, Take 1 tablet (25 mg total) by mouth 2 (two) times daily., Disp: 60 tablet, Rfl: 5 nitroGLYCERIN (NITROSTAT) 0.4 MG SL tablet, Place 0.4 mg under the tongue every 5 (five) minutes as needed.  , Disp: , Rfl: ;  DISCONTD: metoprolol tartrate (LOPRESSOR) 25 MG tablet, Take 25 mg by mouth 2 (two) times daily.  , Disp: , Rfl: ;  calcium carbonate (TUMS - DOSED IN MG ELEMENTAL CALCIUM) 500 MG chewable tablet, Chew 2 tablets by mouth 3 (three) times daily between meals as needed.  , Disp: , Rfl:   No Known Allergies  ROS:  Denies headaches, dizziness, allergies/URI symptoms, fevers, cough, shortness of breath.  Denies chest pain, palpitations, nausea, vomiting, reflux, changes in bowel habits, blood in stools.  Denies other joint pains, skin  concerns, or other problems.  PHYSICAL EXAM: BP 142/92  Pulse 80  Ht 5\' 10"  (1.778 m)  Wt 223 lb (101.152 kg)  BMI 32.00 kg/m2 Well developed, pleasant male, appearing older than stated age, in no distress HEENT:  PERRL, EOMI, conjunctiva clear.  Poor dentition, OP clear.   Neck: no lymphadenopathy, thyromegaly or carotid bruit Heart: regular rate and rhythm without murmur Lungs: clear bilaterally Back: no spinal or CVA tenderness Abdomen: soft, mildly obese, nontender, no organomegaly or mass. Extremities: no clubbing, cyanosis or edema, 2+ pulses. Knees--crepitus and some stiffness bilaterally (I think pt was resisting, not completely relaxing).  No effusion, warmth, or tenderness Psych: normal mood, affect, hygiene and grooming  ASSESSMENT/PLAN: 1. Mixed hyperlipidemia  Comprehensive metabolic panel, Lipid panel  2. Elevated LFTs  CBC with Differential, Comprehensive metabolic panel  3. HTN (hypertension)  Comprehensive metabolic panel   Hyperlipidemia, mixed--abnormal on last check.  Due for recheck and med adjustment if needed.  May need to be very cautious due to elevated LFT's.  Continue lowfat diet.  Elevated LFT's--negative workup per Dr. Russella Dar.  Past due for repeat CBC. We can do with our labs and forward labs to Dr. Russella Dar. Due for repeat LFT's--will check with chem panel when he returns fasting.  Atrial fibrillation--resolved  Hypertension--some white coat component.  Continue monitoring BP's at home.  Pt reports his monitor has been verified as accurate in the past.  Myalgias--trial of Coenzyme Q10, possibly related to statins.  Knee pain--likely arthritis--trial of Tylenol arthritis, and glucosamine/chondroitin combination (ie Osteo-biflex) If ongoing knee pain, will need x-rays/further evaluation.

## 2011-08-25 ENCOUNTER — Other Ambulatory Visit: Payer: BC Managed Care – PPO

## 2011-08-25 ENCOUNTER — Other Ambulatory Visit: Payer: Self-pay | Admitting: Family Medicine

## 2011-08-25 DIAGNOSIS — E611 Iron deficiency: Secondary | ICD-10-CM

## 2011-08-25 DIAGNOSIS — E782 Mixed hyperlipidemia: Secondary | ICD-10-CM

## 2011-08-25 DIAGNOSIS — E785 Hyperlipidemia, unspecified: Secondary | ICD-10-CM

## 2011-08-25 DIAGNOSIS — I10 Essential (primary) hypertension: Secondary | ICD-10-CM

## 2011-08-25 LAB — CBC WITH DIFFERENTIAL/PLATELET
Basophils Absolute: 0.1 10*3/uL (ref 0.0–0.1)
Basophils Relative: 1 % (ref 0–1)
MCHC: 31.8 g/dL (ref 30.0–36.0)
Monocytes Absolute: 0.9 10*3/uL (ref 0.1–1.0)
Neutro Abs: 5.2 10*3/uL (ref 1.7–7.7)
Neutrophils Relative %: 58 % (ref 43–77)
Platelets: 336 10*3/uL (ref 150–400)
RDW: 14.4 % (ref 11.5–15.5)

## 2011-08-25 LAB — LIPID PANEL
HDL: 29 mg/dL — ABNORMAL LOW (ref >39)
LDL Cholesterol: 94 mg/dL (ref 0–99)
Total CHOL/HDL Ratio: 5.3 Ratio
Triglycerides: 153 mg/dL — ABNORMAL HIGH (ref <150)
VLDL: 31 mg/dL (ref 0–40)

## 2011-08-25 LAB — COMPREHENSIVE METABOLIC PANEL
Alkaline Phosphatase: 77 U/L (ref 39–117)
BUN: 18 mg/dL (ref 6–23)
Glucose, Bld: 88 mg/dL (ref 70–99)
Total Bilirubin: 0.6 mg/dL (ref 0.3–1.2)

## 2012-03-12 ENCOUNTER — Other Ambulatory Visit: Payer: Self-pay | Admitting: Family Medicine

## 2012-03-15 ENCOUNTER — Ambulatory Visit (INDEPENDENT_AMBULATORY_CARE_PROVIDER_SITE_OTHER): Payer: BC Managed Care – PPO | Admitting: Family Medicine

## 2012-03-15 ENCOUNTER — Encounter: Payer: Self-pay | Admitting: Family Medicine

## 2012-03-15 VITALS — BP 156/84 | HR 64 | Ht 70.0 in | Wt 225.0 lb

## 2012-03-15 DIAGNOSIS — E785 Hyperlipidemia, unspecified: Secondary | ICD-10-CM

## 2012-03-15 DIAGNOSIS — I2510 Atherosclerotic heart disease of native coronary artery without angina pectoris: Secondary | ICD-10-CM

## 2012-03-15 DIAGNOSIS — Z125 Encounter for screening for malignant neoplasm of prostate: Secondary | ICD-10-CM

## 2012-03-15 DIAGNOSIS — I10 Essential (primary) hypertension: Secondary | ICD-10-CM

## 2012-03-15 DIAGNOSIS — I4891 Unspecified atrial fibrillation: Secondary | ICD-10-CM

## 2012-03-15 DIAGNOSIS — Z23 Encounter for immunization: Secondary | ICD-10-CM

## 2012-03-15 DIAGNOSIS — Z79899 Other long term (current) drug therapy: Secondary | ICD-10-CM

## 2012-03-15 LAB — CBC WITH DIFFERENTIAL/PLATELET
Basophils Relative: 1 % (ref 0–1)
Eosinophils Absolute: 0.4 10*3/uL (ref 0.0–0.7)
Eosinophils Relative: 4 % (ref 0–5)
Hemoglobin: 13.9 g/dL (ref 13.0–17.0)
Lymphs Abs: 2.6 10*3/uL (ref 0.7–4.0)
MCH: 27.7 pg (ref 26.0–34.0)
MCHC: 34 g/dL (ref 30.0–36.0)
MCV: 81.5 fL (ref 78.0–100.0)
Monocytes Relative: 8 % (ref 3–12)
Neutrophils Relative %: 59 % (ref 43–77)
Platelets: 276 10*3/uL (ref 150–400)
RBC: 5.02 MIL/uL (ref 4.22–5.81)

## 2012-03-15 LAB — LIPID PANEL
HDL: 33 mg/dL — ABNORMAL LOW (ref >39)
LDL Cholesterol: 108 mg/dL — ABNORMAL HIGH (ref 0–99)
Triglycerides: 277 mg/dL — ABNORMAL HIGH (ref <150)
VLDL: 55 mg/dL — ABNORMAL HIGH (ref 0–40)

## 2012-03-15 LAB — COMPREHENSIVE METABOLIC PANEL
AST: 32 U/L (ref 0–37)
Albumin: 5.2 g/dL (ref 3.5–5.2)
Alkaline Phosphatase: 73 U/L (ref 39–117)
Glucose, Bld: 82 mg/dL (ref 70–99)
Potassium: 4.5 mEq/L (ref 3.5–5.3)
Sodium: 137 mEq/L (ref 135–145)
Total Bilirubin: 0.6 mg/dL (ref 0.3–1.2)
Total Protein: 8.3 g/dL (ref 6.0–8.3)

## 2012-03-15 MED ORDER — NITROGLYCERIN 0.4 MG SL SUBL: 0.4000 mg | SUBLINGUAL_TABLET | SUBLINGUAL | Status: DC | PRN

## 2012-03-15 MED ORDER — METOPROLOL TARTRATE 25 MG PO TABS: 25.0000 mg | ORAL_TABLET | Freq: Two times a day (BID) | ORAL | Status: DC

## 2012-03-15 MED ORDER — ATORVASTATIN CALCIUM 40 MG PO TABS: 40.0000 mg | ORAL_TABLET | Freq: Every day | ORAL | Status: DC

## 2012-03-15 MED ORDER — CLOPIDOGREL BISULFATE 75 MG PO TABS: 75.0000 mg | ORAL_TABLET | Freq: Every day | ORAL | Status: DC

## 2012-03-15 NOTE — Progress Notes (Signed)
Chief Complaint  Patient presents with  . Hyperlipidemia    6 month follow up, patient in fasting.   HPI:  Hypertension follow-up:  Blood pressures elsewhere are 128/88 at [facility] last week.  At home, running 128/85.  Denies dizziness, headaches, chest pain, palpitations.  Denies side effects of medications.  Hasn't taken med yet this morning.  He has been working out of town, so diet isn't as good, and he didn't sleep well last night.  Hyperlipidemia follow-up:  Patient is reportedly following a low-fat, low cholesterol diet.  Compliant with medications and denies medication side effects.  The coenzyme Q10 has helped a lot with his myalgias.  Elevated LFT's--seeing Dr. Russella Dar. Last LFT's through our office 6 months ago were normal.  He hasn't seen or heard anything from his office since last visit (we sent him our results).  Knee pain--at last visit recommended Tylenol arthritis and glucosamine/chondroitin, regular exercise, weight loss. He has gained 2 pounds.  Exercising regularly, walked 2 miles yesterday.  Overall, knees have been doing much better since he started the osteobiflex.  Past Medical History  Diagnosis Date  . Syncope     in setting of AFib with RVR  . Atrial fibrillation with RVR     converted to NSR with IV dilt 10/12 - no coumadin due to need for Plavix and ASA  . Coronary artery disease     LHC 03/17/11: Dx 30%, OM1 30%, OM2 30%, pRCA 30%, EF 40-45% with anterolat and apical HK  . Hypertension   . Hyperlipidemia   . NSTEMI (non-ST elevated myocardial infarction)     FLET THAT NON-ST-ELEVATION MI IS POSSIBLY SECONDARY TO CORONARY EMBOLUS FROM HIS AFIB  . Hematochezia     LIKEY FROM HEMORRHOIDAL BLEEDING; PT HAS H/O  . Elevated LFTs   . Fatty liver    Past Surgical History  Procedure Date  . Cardiac catheterization 03/18/11  . Cardiac event monitor 04/01/11  . Transthoracic echocardiogram 03/19/11   History   Social History  . Marital Status: Married   Spouse Name: N/A    Number of Children: 0  . 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 Smoking status: Former Smoker    Quit date: 05/26/1974  . Smokeless tobacco: Never Used  . Alcohol Use: Yes     2 beers per week.  . Drug Use: No  . Sexually Active: Not on file   Other Topics Concern  . Not on file   Social History Narrative   Lives with wife, 1 dog.   Current outpatient prescriptions:aspirin EC 81 MG tablet, Take 81 mg by mouth daily.  , Disp: , Rfl: ;  atorvastatin (LIPITOR) 40 MG tablet, Take 1 tablet (40 mg total) by mouth daily., Disp: 30 tablet, Rfl: 11;  clopidogrel (PLAVIX) 75 MG tablet, Take 75 mg by mouth daily.  , Disp: , Rfl: ;  metoprolol tartrate (LOPRESSOR) 25 MG tablet, TAKE 1 TABLET (25 MG TOTAL) BY MOUTH 2 (TWO) TIMES DAILY., Disp: 60 tablet, Rfl: 0 calcium carbonate (TUMS - DOSED IN MG ELEMENTAL CALCIUM) 500 MG chewable tablet, Chew 2 tablets by mouth 3 (three) times daily between meals as needed.  , Disp: , Rfl: ;  nitroGLYCERIN (NITROSTAT) 0.4 MG SL tablet, Place 0.4 mg under the tongue every 5 (five) minutes as needed.  , Disp: , Rfl:   No Known Allergies  ROS:  Denies fevers, headaches, dizziness,  URI symptoms, cough, shortness of breath, edema, chest pain, palpitations or tachycardia.  Denies nausea, vomiting, reflux, abdominal pain, bowel changes, skin rashes, significant myalgias or joint pains. Moods good.  PHYSICAL EXAM: BP 162/100  Pulse 64  Ht 5\' 10"  (1.778 m)  Wt 225 lb (102.059 kg)  BMI 32.28 kg/m2 156/84 on repeat by MD Well developed, pleasant male, appearing older than stated age, in no distress  HEENT: PERRL, EOMI, conjunctiva clear.  Neck: no lymphadenopathy, thyromegaly or carotid bruit  Heart: regular rate and rhythm without murmur  Lungs: clear bilaterally  Back: no spinal or CVA tenderness  Abdomen: soft, mildly obese, nontender, no organomegaly or mass.  Extremities: no  clubbing, cyanosis or edema, 2+ pulses.  Psych: normal mood, affect, hygiene and grooming Neuro: alert and oriented.  Cranial nerves grossly intact.  Normal gait, strength.  ASSESSMENT/PLAN: 1. HTN (hypertension)  metoprolol tartrate (LOPRESSOR) 25 MG tablet, Comprehensive metabolic panel  2. Need for prophylactic vaccination and inoculation against influenza  Flu vaccine greater than or equal to 3yo preservative free IM  3. Nonobstructive CAD by Cath 02/2011    4. HLD (hyperlipidemia)  atorvastatin (LIPITOR) 40 MG tablet, Lipid panel  5. Atrial fibrillation  clopidogrel (PLAVIX) 75 MG tablet, TSH  6. Encounter for long-term (current) use of other medications  Comprehensive metabolic panel, CBC with Differential, TSH, Lipid panel  7. Special screening for malignant neoplasm of prostate  PSA   Atrial fibrillation--previously has been under the care of Dr. Ladona Ridgel, but was "released" per pt.  Looks like he is supposed to be maintained on beta blocker and Plavix. Rate is controlled.  BP's elsewhere are normal, elevated in office.  Continue to monitor.  Reviewed low sodium diet, encouraged weight loss.   No immunizations documented--discussed zostavax briefly, and asked him to check insurance re: coverage.  Schedule CPE--will give immunizations at CPE.  Likely needs Tdap (thinks he had Td in 2006), and to check insurance coverage of zostavax.  Likely should give pneumovax as well, given CAD. Labs ordered for CPE.  Reviewed risks/benefits of PSA, and understands, would like it checked.  Understands he will have prostate exam at CPE.  Send copies of labs to Dr. Russella Dar

## 2012-03-15 NOTE — Patient Instructions (Addendum)
Check with your insurance regarding coverage of shingles vaccine (zostavax).  If covered, we can give this at your physical (along with TdaP and pneumonia vaccine).  Try and lose weight, continue to exercise daily, and continue to monitor your blood pressures at home, since they seem to be high at our office.  2 Gram Low Sodium Diet A 2 gram sodium diet restricts the amount of sodium in the diet to no more than 2 g or 2000 mg daily. Limiting the amount of sodium is often used to help lower blood pressure. It is important if you have heart, liver, or kidney problems. Many foods contain sodium for flavor and sometimes as a preservative. When the amount of sodium in a diet needs to be low, it is important to know what to look for when choosing foods and drinks. The following includes some information and guidelines to help make it easier for you to adapt to a low sodium diet. QUICK TIPS  Do not add salt to food.  Avoid convenience items and fast food.  Choose unsalted snack foods.  Buy lower sodium products, often labeled as "lower sodium" or "no salt added."  Check food labels to learn how much sodium is in 1 serving.  When eating at a restaurant, ask that your food be prepared with less salt or none, if possible. READING FOOD LABELS FOR SODIUM INFORMATION The nutrition facts label is a good place to find how much sodium is in foods. Look for products with no more than 500 to 600 mg of sodium per meal and no more than 150 mg per serving. Remember that 2 g = 2000 mg. The food label may also list foods as:  Sodium-free: Less than 5 mg in a serving.  Very low sodium: 35 mg or less in a serving.  Low-sodium: 140 mg or less in a serving.  Light in sodium: 50% less sodium in a serving. For example, if a food that usually has 300 mg of sodium is changed to become light in sodium, it will have 150 mg of sodium.  Reduced sodium: 25% less sodium in a serving. For example, if a food that usually  has 400 mg of sodium is changed to reduced sodium, it will have 300 mg of sodium. CHOOSING FOODS Grains  Avoid: Salted crackers and snack items. Some cereals, including instant hot cereals. Bread stuffing and biscuit mixes. Seasoned rice or pasta mixes.  Choose: Unsalted snack items. Low-sodium cereals, oats, puffed wheat and rice, shredded wheat. English muffins and bread. Pasta. Meats  Avoid: Salted, canned, smoked, spiced, pickled meats, including fish and poultry. Bacon, ham, sausage, cold cuts, hot dogs, anchovies.  Choose: Low-sodium canned tuna and salmon. Fresh or frozen meat, poultry, and fish. Dairy  Avoid: Processed cheese and spreads. Cottage cheese. Buttermilk and condensed milk. Regular cheese.  Choose: Milk. Low-sodium cottage cheese. Yogurt. Sour cream. Low-sodium cheese. Fruits and Vegetables  Avoid: Regular canned vegetables. Regular canned tomato sauce and paste. Frozen vegetables in sauces. Olives. Rosita Fire. Relishes. Sauerkraut.  Choose: Low-sodium canned vegetables. Low-sodium tomato sauce and paste. Frozen or fresh vegetables. Fresh and frozen fruit. Condiments  Avoid: Canned and packaged gravies. Worcestershire sauce. Tartar sauce. Barbecue sauce. Soy sauce. Steak sauce. Ketchup. Onion, garlic, and table salt. Meat flavorings and tenderizers.  Choose: Fresh and dried herbs and spices. Low-sodium varieties of mustard and ketchup. Lemon juice. Tabasco sauce. Horseradish. SAMPLE 2 GRAM SODIUM MEAL PLAN Breakfast / Sodium (mg)  1 cup low-fat milk / 143 mg  2 slices whole-wheat toast / 270 mg  1 tbs heart-healthy margarine / 153 mg  1 hard-boiled egg / 139 mg  1 small orange / 0 mg Lunch / Sodium (mg)  1 cup raw carrots / 76 mg   cup hummus / 298 mg  1 cup low-fat milk / 143 mg   cup red grapes / 2 mg  1 whole-wheat pita bread / 356 mg Dinner / Sodium (mg)  1 cup whole-wheat pasta / 2 mg  1 cup low-sodium tomato sauce / 73 mg  3 oz lean  ground beef / 57 mg  1 small side salad (1 cup raw spinach leaves,  cup cucumber,  cup yellow bell pepper) with 1 tsp olive oil and 1 tsp red wine vinegar / 25 mg Snack / Sodium (mg)  1 container low-fat vanilla yogurt / 107 mg  3 graham cracker squares / 127 mg Nutrient Analysis  Calories: 2033  Protein: 77 g  Carbohydrate: 282 g  Fat: 72 g  Sodium: 1971 mg Document Released: 05/09/2005 Document Revised: 08/01/2011 Document Reviewed: 08/10/2009 [facility] Patient Information 2013 [place], [place].

## 2012-04-26 ENCOUNTER — Other Ambulatory Visit: Payer: Self-pay | Admitting: Family Medicine

## 2012-06-22 ENCOUNTER — Encounter: Payer: Self-pay | Admitting: Internal Medicine

## 2012-07-05 ENCOUNTER — Encounter: Payer: BC Managed Care – PPO | Admitting: Family Medicine

## 2012-07-10 ENCOUNTER — Encounter: Payer: Self-pay | Admitting: Family Medicine

## 2012-07-10 ENCOUNTER — Ambulatory Visit (INDEPENDENT_AMBULATORY_CARE_PROVIDER_SITE_OTHER): Payer: BC Managed Care – PPO | Admitting: Family Medicine

## 2012-07-10 VITALS — BP 160/96 | HR 72 | Ht 70.0 in | Wt 225.0 lb

## 2012-07-10 DIAGNOSIS — Z2911 Encounter for prophylactic immunotherapy for respiratory syncytial virus (RSV): Secondary | ICD-10-CM

## 2012-07-10 DIAGNOSIS — Z0000 Encounter for general adult medical examination without abnormal findings: Secondary | ICD-10-CM

## 2012-07-10 DIAGNOSIS — E782 Mixed hyperlipidemia: Secondary | ICD-10-CM

## 2012-07-10 DIAGNOSIS — I10 Essential (primary) hypertension: Secondary | ICD-10-CM

## 2012-07-10 DIAGNOSIS — Z23 Encounter for immunization: Secondary | ICD-10-CM

## 2012-07-10 LAB — POCT URINALYSIS DIPSTICK
Glucose, UA: NEGATIVE
Ketones, UA: NEGATIVE
Leukocytes, UA: NEGATIVE
Spec Grav, UA: <=1.005

## 2012-07-10 NOTE — Progress Notes (Signed)
Chief Complaint  Patient presents with  . Annual Exam    fasting annual exam. Mentions that he slipped on ice at his home shop and his left leg and buttock have been sore. UA showed trace blood-pt states he is asymptomatic.    Timothy Estes is a 62 y.o. male who presents for a complete physical.  He has the following concerns:  He is to have cholesterol rechecked today--HDL was low, LDL okay, but TG high and ratio high.  He has been taking fish oil 2400mg  daily and cut back on red meat, eating more salads since the last lipid check.  Cut back on junk foods, sweets, fried foods.  Having some chocolate.  No alcohol, regular soda or sweet tea (just unsweetened).  HTN--Checks BP's at work and at home.  Last week was 124/84.  131/88 was the highest he can recall getting.  Didn't sleep well last night.  Denies headache or chest pain currently. Denies palpitations (h/o atrial fibrillation), tachycardia, shortness of breath  Health Maintenance: Immunization History  Administered Date(s) Administered  . Influenza Split 03/15/2012   Last colonoscopy: Never.  Supposed to have one set up through Dr. Ardell Isaacs office  Last PSA: October, normal. Dentist: 3 years ago.  Plans to schedule Ophtho: never Exercise:  None lately.  Walks up and down driveway twice (1.25 miles) 2-4x/week.  Past Medical History  Diagnosis Date  . Syncope     in setting of AFib with RVR  . Atrial fibrillation with RVR     converted to NSR with IV dilt 10/12 - no coumadin due to need for Plavix and ASA  . Coronary artery disease     LHC 03/17/11: Dx 30%, OM1 30%, OM2 30%, pRCA 30%, EF 40-45% with anterolat and apical HK  . Hypertension   . Hyperlipidemia   . NSTEMI (non-ST elevated myocardial infarction)     FLET THAT NON-ST-ELEVATION MI IS POSSIBLY SECONDARY TO CORONARY EMBOLUS FROM HIS AFIB  . Hematochezia     LIKEY FROM HEMORRHOIDAL BLEEDING; PT HAS H/O  . Elevated LFTs   . Fatty liver     Past Surgical History   Procedure Laterality Date  . Cardiac catheterization  03/18/11  . Cardiac event monitor  04/01/11  . Transthoracic echocardiogram  03/19/11    History   Social History  . Marital Status: Married    Spouse Name: N/A    Number of Children: 0  . 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 Smoking status: Former Smoker    Quit date: 11/24/1974  . Smokeless tobacco: Never Used  . Alcohol Use: Yes     Comment: 2 beers per week.  . Drug Use: No  . Sexually Active: Yes -- Male partner(s)   Other Topics Concern  . Not on file   Social History Narrative   Lives with wife, 1 dog (who is very ill)    Family History  Problem Relation Age of Onset  . Heart disease Neg Hx   . Cancer Neg Hx   . Diabetes Neg Hx   . Hyperlipidemia Brother   . Hypertension Brother   . Arthritis Mother     Current outpatient prescriptions:aspirin EC 81 MG tablet, Take 81 mg by mouth daily.  , Disp: , Rfl: ;  atorvastatin (LIPITOR) 40 MG tablet, Take 1 tablet (40 mg total) by mouth daily., Disp: 30 tablet, Rfl: 5;  clopidogrel (PLAVIX) 75  MG tablet, Take 1 tablet (75 mg total) by mouth daily., Disp: 30 tablet, Rfl: 11;  COENZYME Q-10 PO, Take 1 tablet by mouth daily., Disp: , Rfl:  metoprolol tartrate (LOPRESSOR) 25 MG tablet, Take 1 tablet (25 mg total) by mouth 2 (two) times daily., Disp: 60 tablet, Rfl: 5;  Misc Natural Products (OSTEO BI-FLEX TRIPLE STRENGTH PO), Take 1 tablet by mouth daily., Disp: , Rfl: ;  nitroGLYCERIN (NITROSTAT) 0.4 MG SL tablet, Place 1 tablet (0.4 mg total) under the tongue every 5 (five) minutes as needed for chest pain., Disp: 28 tablet, Rfl: 1  No Known Allergies  ROS:  The patient denies anorexia, fever, weight changes, headaches,  vision loss, decreased hearing, ear pain, hoarseness, chest pain, palpitations, dizziness, syncope, dyspnea on exertion, cough, swelling, nausea, vomiting, diarrhea, constipation, abdominal  pain, melena, hematochezia, indigestion/heartburn, hematuria, incontinence, erectile dysfunction, nocturia, weakened urine stream, dysuria, genital lesions, numbness, tingling, weakness, tremor, suspicious skin lesions, depression, anxiety, abnormal bleeding/bruising, or enlarged lymph nodes  Slight sniffle.  L knee pain since he slipped on ice and fell onto L knee and hip.  Getting better.  Denies memory issues.  Dog is very ill, didn't sleep much last night at all.  In general, no insomnia or mood issues.  PHYSICAL EXAM: BP 180/108  Pulse 72  Ht 5\' 10"  (1.778 m)  Wt 225 lb (102.059 kg)  BMI 32.28 kg/m2 160/94 on repeat by MD General Appearance:    Alert, cooperative, no distress, appears older than stated age  Head:    Normocephalic, without obvious abnormality, atraumatic  Eyes:    PERRL, conjunctiva/corneas clear, EOM's intact, fundi    benign  Ears:    Normal TM's and external ear canals  Nose:   Nares normal, mucosa normal, no drainage or sinus   tenderness  Throat:   Lips, mucosa, and tongue normal; significant tooth decay noted  Neck:   Supple, no lymphadenopathy;  thyroid:  no   enlargement/tenderness/nodules; no carotid   bruit or JVD  Back:    Spine nontender, no curvature, ROM normal, no CVA     tenderness  Lungs:     Clear to auscultation bilaterally without wheezes, rales or     ronchi; respirations unlabored. Trace crackle at right base initially, resolved after a few deep breaths  Chest Wall:    No tenderness or deformity   Heart:    Regular rate and rhythm, S1 and S2 normal, no murmur, rub   or gallop  Breast Exam:    No chest wall tenderness, masses or gynecomastia  Abdomen:     Soft, non-tender, nondistended, normoactive bowel sounds,    no masses, no hepatosplenomegaly  Genitalia:    Normal male external genitalia without lesions.  Testicles without masses.  No inguinal hernias.  Rectal:    Normal sphincter tone, no masses or tenderness; external hemorrhoids  noted--not bleeding or significantly inflamed. guaiac positive light brown stool.  Prostate smooth, no nodules, not enlarged.  Extremities:   No clubbing, cyanosis or edema.  L knee--+ effusion, no warmth.  Some decreased ROM due to swelling.  nontender to palpation.  Guarding with attempt at exam.  Pulses:   2+ and symmetric all extremities  Skin:   Skin color, texture, turgor normal, no rashes or lesions  Lymph nodes:   Cervical, supraclavicular, and axillary nodes normal  Neurologic:   CNII-XII intact, normal strength, sensation and gait; reflexes 2+ and symmetric throughout  Psych:   Normal mood, affect, hygiene and grooming.    Lab Results  Component Value Date   WBC 9.1 03/15/2012   HGB 13.9 03/15/2012   HCT 40.9 03/15/2012   MCV 81.5 03/15/2012   PLT 276 03/15/2012     Chemistry      Component Value Date/Time   NA 137 03/15/2012 0855   K 4.5 03/15/2012 0855   CL 101 03/15/2012 0855   CO2 25 03/15/2012 0855   BUN 20 03/15/2012 0855   CREATININE 1.07 03/15/2012 0855   CREATININE 1.21 03/19/2011 0630      Component Value Date/Time   CALCIUM 9.8 03/15/2012 0855   ALKPHOS 73 03/15/2012 0855   AST 32 03/15/2012 0855   ALT 35 03/15/2012 0855   BILITOT 0.6 03/15/2012 0855     Lab Results  Component Value Date   CHOL 196 03/15/2012   HDL 33* 03/15/2012   LDLCALC 108* 03/15/2012   TRIG 277* 03/15/2012   CHOLHDL 5.9 03/15/2012   Lab Results  Component Value Date   PSA 0.41 03/15/2012   Lab Results  Component Value Date   TSH 1.801 03/15/2012    ASSESSMENT/PLAN:  Routine general medical examination at a health care facility - Plan: Visual acuity screening, POCT Urinalysis Dipstick  Need for Tdap vaccination - Plan: Tdap vaccine greater than or equal to 7yo IM  Need for shingles vaccine - Plan: Varicella-zoster vaccine subcutaneous  Mixed hyperlipidemia - Plan: Lipid panel  HTN (hypertension)  HTN--elevated today, normal elsewhere.  Continue  monitoring elsewhere, and return sooner than scheduled if remains high. Hyperlipidemia, mixed--due for recheck today.  If LDL okay, but HDL and TG still abnormal, consider adding niaspan.  Briefly reviewed side effects and how to take, so that med can be called in if we decide to start (vs just increasing lipitor dose).  Discussed PSA screening (risks/benefits), recommended at least 30 minutes of aerobic activity at least 5 days/week; proper sunscreen use reviewed; healthy diet and alcohol recommendations (less than or equal to 2 drinks/day) reviewed; regular seatbelt use; changing batteries in smoke detectors. Self-testicular exams. Immunization recommendations discussed--Tdap and zostavax today. Risks and side effects of meds reviewed. Continue yearly flu shots. Pneumovax at age 2.  Colonoscopy recommendations reviewed--past due.  Schedule routine eye exam. Schedule dental cleaning Schedule colonoscopy.   F/u will depend on lab results (f/u in 6 months regardless, sooner prn based on lipids and if BP's are high)

## 2012-07-10 NOTE — Patient Instructions (Signed)
HEALTH MAINTENANCE RECOMMENDATIONS:  It is recommended that you get at least 30 minutes of aerobic exercise at least 5 days/week (for weight loss, you may need as much as 60-90 minutes). This can be any activity that gets your heart rate up. This can be divided in 10-15 minute intervals if needed, but try and build up your endurance at least once a week.  Weight bearing exercise is also recommended twice weekly.  Eat a healthy diet with lots of vegetables, fruits and fiber.  "Colorful" foods have a lot of vitamins (ie green vegetables, tomatoes, red peppers, etc).  Limit sweet tea, regular sodas and alcoholic beverages, all of which has a lot of calories and sugar.  Up to 2 alcoholic drinks daily may be beneficial for men (unless trying to lose weight, watch sugars).  Drink a lot of water.  Sunscreen of at least SPF 30 should be used on all sun-exposed parts of the skin when outside between the hours of 10 am and 4 pm (not just when at beach or pool, but even with exercise, golf, tennis, and yard work!)  Use a sunscreen that says "broad spectrum" so it covers both UVA and UVB rays, and make sure to reapply every 1-2 hours.  Remember to change the batteries in your smoke detectors when changing your clock times in the spring and fall.  Use your seat belt every time you are in a car, and please drive safely and not be distracted with cell phones and texting while driving.  Schedule routine eye exam. Schedule dental cleaning Schedule colonoscopy.

## 2012-07-11 LAB — LIPID PANEL
Cholesterol: 161 mg/dL (ref 0–200)
Total CHOL/HDL Ratio: 4.7 Ratio
Triglycerides: 249 mg/dL — ABNORMAL HIGH (ref <150)
VLDL: 50 mg/dL — ABNORMAL HIGH (ref 0–40)

## 2012-07-16 ENCOUNTER — Other Ambulatory Visit: Payer: Self-pay | Admitting: *Deleted

## 2012-07-16 DIAGNOSIS — E785 Hyperlipidemia, unspecified: Secondary | ICD-10-CM

## 2012-07-16 DIAGNOSIS — Z79899 Other long term (current) drug therapy: Secondary | ICD-10-CM

## 2012-07-16 MED ORDER — NIACIN ER (ANTIHYPERLIPIDEMIC) 500 MG PO TBCR: 500.0000 mg | EXTENDED_RELEASE_TABLET | Freq: Every day | ORAL | Status: DC

## 2012-08-06 ENCOUNTER — Telehealth: Payer: Self-pay | Admitting: Family Medicine

## 2012-08-06 NOTE — Telephone Encounter (Signed)
Patient informed to keep lab appt for 09/05/12.

## 2012-08-06 NOTE — Telephone Encounter (Signed)
Pt called he thought he was supposed to come in today for labs and he changed to tomorrow.  There are future labs in the system, but they are showing for mid April.  Please advise pt if he should not come until then [phone].  If it is ok you don't need to call pt as he will show up tomorrow.

## 2012-08-07 ENCOUNTER — Other Ambulatory Visit: Payer: Self-pay

## 2012-08-14 ENCOUNTER — Telehealth: Payer: Self-pay | Admitting: Family Medicine

## 2012-08-14 DIAGNOSIS — E785 Hyperlipidemia, unspecified: Secondary | ICD-10-CM

## 2012-08-14 MED ORDER — NIACIN ER (ANTIHYPERLIPIDEMIC) 500 MG PO TBCR: 500.0000 mg | EXTENDED_RELEASE_TABLET | Freq: Every day | ORAL | Status: DC

## 2012-08-14 NOTE — Telephone Encounter (Signed)
rx sent

## 2012-09-05 ENCOUNTER — Other Ambulatory Visit: Payer: BC Managed Care – PPO

## 2012-09-05 DIAGNOSIS — E785 Hyperlipidemia, unspecified: Secondary | ICD-10-CM

## 2012-09-05 DIAGNOSIS — Z79899 Other long term (current) drug therapy: Secondary | ICD-10-CM

## 2012-09-05 LAB — HEPATIC FUNCTION PANEL
ALT: 21 U/L (ref 0–53)
AST: 19 U/L (ref 0–37)
Alkaline Phosphatase: 77 U/L (ref 39–117)
Bilirubin, Direct: 0.1 mg/dL (ref 0.0–0.3)
Indirect Bilirubin: 0.3 mg/dL (ref 0.0–0.9)
Total Bilirubin: 0.4 mg/dL (ref 0.3–1.2)

## 2012-09-05 LAB — LIPID PANEL
Cholesterol: 157 mg/dL (ref 0–200)
Total CHOL/HDL Ratio: 4.9 Ratio

## 2012-10-01 ENCOUNTER — Telehealth: Payer: Self-pay | Admitting: Family Medicine

## 2012-10-01 DIAGNOSIS — E785 Hyperlipidemia, unspecified: Secondary | ICD-10-CM

## 2012-10-01 MED ORDER — ATORVASTATIN CALCIUM 40 MG PO TABS: 40.0000 mg | ORAL_TABLET | Freq: Every day | ORAL | Status: DC

## 2012-10-01 NOTE — Telephone Encounter (Signed)
Done

## 2012-10-01 NOTE — Telephone Encounter (Signed)
PT called and states [facility] At[facility].  I do not see a request.  Please review and refill Rx [facility] .

## 2012-10-16 ENCOUNTER — Other Ambulatory Visit: Payer: Self-pay | Admitting: Family Medicine

## 2012-10-25 ENCOUNTER — Other Ambulatory Visit: Payer: Self-pay | Admitting: Family Medicine

## 2012-12-31 ENCOUNTER — Other Ambulatory Visit: Payer: Self-pay | Admitting: Family Medicine

## 2013-01-23 ENCOUNTER — Encounter: Payer: Self-pay | Admitting: Family Medicine

## 2013-01-23 ENCOUNTER — Ambulatory Visit (INDEPENDENT_AMBULATORY_CARE_PROVIDER_SITE_OTHER): Payer: BC Managed Care – PPO | Admitting: Family Medicine

## 2013-01-23 VITALS — BP 140/80 | HR 64 | Ht 70.0 in | Wt 224.0 lb

## 2013-01-23 DIAGNOSIS — E782 Mixed hyperlipidemia: Secondary | ICD-10-CM

## 2013-01-23 DIAGNOSIS — Z23 Encounter for immunization: Secondary | ICD-10-CM

## 2013-01-23 DIAGNOSIS — I10 Essential (primary) hypertension: Secondary | ICD-10-CM

## 2013-01-23 NOTE — Patient Instructions (Signed)
Continue your current medications.  We will contact you with your results and let you know if Niaspan dose is being increased, or remaining the same. Continue to try and exercise every day. Try and lose additional weight. Continue to monitor your blood pressures (don't worry about them being a little higher in the office, as long as they are okay at home and/or work).

## 2013-01-23 NOTE — Progress Notes (Signed)
Chief Complaint  Patient presents with  . Hypertension    fasting med check. No major concerns. Did want to know if mega red krill oil is the same as fish oil.   Hyperlipidemia follow-up:  Patient is reportedly following a low-fat, low cholesterol diet.  Compliant with medications and denies medication side effects. He is on atorvastatin, and Niaspan 500mg  was added after his last visit in February.  Recheck in April was still borderline.  Only once he woke up with his face flushed.  He takes aspirin prior to taking the Niaspan, and takes it along with unsweetened applesauce.  No alcohol, regular soda or sweet tea (just unsweetened).   Hypertension follow-up:  Blood pressures elsewhere are 130-135/85 at home, 122/82 recently at work (3 days ago at [employer]).  Denies dizziness, headaches, chest pain.  Denies side effects of medications.  Denies palpitations (h/o atrial fibrillation), tachycardia, shortness of breath.  He has been working out of town for the last 4 months.  He has been walking a lot on the job.  He has been cutting back on his portions some.   Past Medical History  Diagnosis Date  . Syncope     in setting of AFib with RVR  . Atrial fibrillation with RVR     converted to NSR with IV dilt 10/12 - no coumadin due to need for Plavix and ASA  . Coronary artery disease     LHC 03/17/11: Dx 30%, OM1 30%, OM2 30%, pRCA 30%, EF 40-45% with anterolat and apical HK  . Hypertension   . Hyperlipidemia   . NSTEMI (non-ST elevated myocardial infarction)     FLET THAT NON-ST-ELEVATION MI IS POSSIBLY SECONDARY TO CORONARY EMBOLUS FROM HIS AFIB  . Hematochezia     LIKEY FROM HEMORRHOIDAL BLEEDING; PT HAS H/O  . Elevated LFTs   . Fatty liver    Past Surgical History  Procedure Laterality Date  . Cardiac catheterization  03/18/11  . Cardiac event monitor  04/01/11  . Transthoracic echocardiogram  03/19/11   History   Social History  . Marital Status: Married    Spouse Name: N/A    Number of Children: 0  . 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 Smoking status: Former Smoker    Quit date: 11/24/1974  . Smokeless tobacco: Never Used  . Alcohol Use: Yes     Comment: 2 beers per week.  . Drug Use: No  . Sexual Activity: Yes    Partners: Female   Other Topics Concern  . Not on file   Social History Narrative   Lives with wife, 1 dog (who is very ill)   Current outpatient prescriptions:aspirin EC 81 MG tablet, Take 81 mg by mouth daily.  , Disp: , Rfl: ;  atorvastatin (LIPITOR) 40 MG tablet, Take 1 tablet (40 mg total) by mouth daily., Disp: 30 tablet, Rfl: 4;  clopidogrel (PLAVIX) 75 MG tablet, Take 1 tablet (75 mg total) by mouth daily., Disp: 30 tablet, Rfl: 11;  COENZYME Q-10 PO, Take 1 tablet by mouth daily., Disp: , Rfl:  fish oil-omega-3 fatty acids 1000 MG capsule, Take 4 g by mouth daily. , Disp: , Rfl: ;  metoprolol tartrate (LOPRESSOR) 25 MG tablet, TAKE 1 TABLET (25 MG TOTAL) BY MOUTH 2 (TWO) TIMES DAILY., Disp: 60 tablet, Rfl: 5;  Misc Natural Products (OSTEO BI-FLEX TRIPLE STRENGTH PO), Take 1 tablet by mouth daily.,  Disp: , Rfl: ;  niacin (NIASPAN) 500 MG CR tablet, TAKE 1 TABLET (500 MG TOTAL) BY MOUTH AT BEDTIME., Disp: 30 tablet, Rfl: 1 nitroGLYCERIN (NITROSTAT) 0.4 MG SL tablet, Place 1 tablet (0.4 mg total) under the tongue every 5 (five) minutes as needed for chest pain., Disp: 28 tablet, Rfl: 1  No Known Allergies  ROS:  Denies fevers, chills, headaches, dizziness, nausea, vomiting, bowel changes, urinary complaints.  Some joint stiffness, but no pain. Denies easy bleeding/bruising, skin rashes.  Moods are good. Some congestion and postnasal drainage, sniffles related to being outside--pollen and dust exposure  PHYSICAL EXAM: BP 140/90  Pulse 64  Ht 5\' 10"  (1.778 m)  Wt 224 lb (101.606 kg)  BMI 32.14 kg/m2 140/80 on repeat by MD, RA Well developed, pleasant male in good spirits, in  no distress Neck: no lymphadenopathy, thyromegaly or mass Heart: regular rate and rhythm, no murmur Lungs: clear bilaterally Abdomen: soft, nontender, no organomegaly or mass Extremities: no edema Skin: no rash Psych: normal mood, affect, hygiene and grooming Neuro: alert and oriented.  Cranial nerves grossly intact. Normal strength, gait.  Lab Results  Component Value Date   CHOL 157 09/05/2012   HDL 32* 09/05/2012   LDLCALC 85 09/05/2012   TRIG 199* 09/05/2012   CHOLHDL 4.9 09/05/2012    ASSESSMENT/PLAN:  Mixed hyperlipidemia - Plan: Comprehensive metabolic panel, Lipid panel  HTN (hypertension) - controlled per home/work numbers; some white coat component in the office, improved on recheck - Plan: Comprehensive metabolic panel  Need for prophylactic vaccination and inoculation against influenza - Plan: Flu Vaccine QUAD 36+ mos IM  Borderline lipids in past--repeat today.  Consider increasing niaspan dose based on results.  F/u in 6 months for CPE/med check, sooner prn based on lab results or other concerns.

## 2013-01-24 ENCOUNTER — Other Ambulatory Visit: Payer: Self-pay | Admitting: *Deleted

## 2013-01-24 ENCOUNTER — Telehealth: Payer: Self-pay | Admitting: *Deleted

## 2013-01-24 DIAGNOSIS — Z79899 Other long term (current) drug therapy: Secondary | ICD-10-CM

## 2013-01-24 DIAGNOSIS — E782 Mixed hyperlipidemia: Secondary | ICD-10-CM

## 2013-01-24 LAB — LIPID PANEL
Cholesterol: 161 mg/dL (ref 0–200)
HDL: 35 mg/dL — ABNORMAL LOW (ref >39)
LDL Cholesterol: 69 mg/dL (ref 0–99)
Total CHOL/HDL Ratio: 4.6 Ratio
Triglycerides: 284 mg/dL — ABNORMAL HIGH (ref <150)
VLDL: 57 mg/dL — ABNORMAL HIGH (ref 0–40)

## 2013-01-24 LAB — COMPREHENSIVE METABOLIC PANEL
Alkaline Phosphatase: 57 U/L (ref 39–117)
BUN: 16 mg/dL (ref 6–23)
CO2: 22 mEq/L (ref 19–32)
Creat: 1.09 mg/dL (ref 0.50–1.35)
Glucose, Bld: 96 mg/dL (ref 70–99)
Sodium: 137 mEq/L (ref 135–145)
Total Bilirubin: 0.6 mg/dL (ref 0.3–1.2)

## 2013-01-24 MED ORDER — NIACIN ER (ANTIHYPERLIPIDEMIC) 1000 MG PO TBCR: 1000.0000 mg | EXTENDED_RELEASE_TABLET | Freq: Every day | ORAL | Status: DC

## 2013-01-24 NOTE — Telephone Encounter (Signed)
Went over lab results with patient and called in new rx for niaspan 1000mg  to [facility]. Scheduled patient for fasting labs 03/27/13 @8 :30am. Entered future orders.

## 2013-03-04 ENCOUNTER — Other Ambulatory Visit: Payer: Self-pay | Admitting: Family Medicine

## 2013-03-19 ENCOUNTER — Other Ambulatory Visit: Payer: Self-pay | Admitting: Family Medicine

## 2013-03-20 ENCOUNTER — Other Ambulatory Visit: Payer: Self-pay | Admitting: Family Medicine

## 2013-03-27 ENCOUNTER — Other Ambulatory Visit: Payer: BC Managed Care – PPO

## 2013-03-27 DIAGNOSIS — E782 Mixed hyperlipidemia: Secondary | ICD-10-CM

## 2013-03-27 DIAGNOSIS — Z79899 Other long term (current) drug therapy: Secondary | ICD-10-CM

## 2013-03-27 LAB — LIPID PANEL: Cholesterol: 162 mg/dL (ref 0–200)

## 2013-03-27 LAB — HEPATIC FUNCTION PANEL
ALT: 37 U/L (ref 0–53)
AST: 33 U/L (ref 0–37)
Bilirubin, Direct: 0.1 mg/dL (ref 0.0–0.3)
Indirect Bilirubin: 0.4 mg/dL (ref 0.0–0.9)

## 2013-04-01 NOTE — Progress Notes (Signed)
Pt called and I informed him of his labs.  He was very excited!

## 2013-04-08 ENCOUNTER — Other Ambulatory Visit: Payer: Self-pay | Admitting: Family Medicine

## 2013-05-20 ENCOUNTER — Other Ambulatory Visit: Payer: Self-pay | Admitting: Family Medicine

## 2013-07-22 ENCOUNTER — Ambulatory Visit (INDEPENDENT_AMBULATORY_CARE_PROVIDER_SITE_OTHER): Payer: BC Managed Care – PPO | Admitting: Family Medicine

## 2013-07-22 ENCOUNTER — Other Ambulatory Visit: Payer: Self-pay | Admitting: Family Medicine

## 2013-07-22 ENCOUNTER — Encounter: Payer: Self-pay | Admitting: Family Medicine

## 2013-07-22 VITALS — BP 140/86 | HR 68 | Ht 71.0 in | Wt 225.0 lb

## 2013-07-22 DIAGNOSIS — Z0000 Encounter for general adult medical examination without abnormal findings: Secondary | ICD-10-CM

## 2013-07-22 DIAGNOSIS — E782 Mixed hyperlipidemia: Secondary | ICD-10-CM

## 2013-07-22 DIAGNOSIS — I2510 Atherosclerotic heart disease of native coronary artery without angina pectoris: Secondary | ICD-10-CM

## 2013-07-22 DIAGNOSIS — Z125 Encounter for screening for malignant neoplasm of prostate: Secondary | ICD-10-CM

## 2013-07-22 DIAGNOSIS — I10 Essential (primary) hypertension: Secondary | ICD-10-CM

## 2013-07-22 DIAGNOSIS — Z79899 Other long term (current) drug therapy: Secondary | ICD-10-CM

## 2013-07-22 LAB — CBC WITH DIFFERENTIAL/PLATELET
BASOS PCT: 1 % (ref 0–1)
Basophils Absolute: 0.1 10*3/uL (ref 0.0–0.1)
Eosinophils Absolute: 0.4 10*3/uL (ref 0.0–0.7)
Eosinophils Relative: 5 % (ref 0–5)
HCT: 42.4 % (ref 39.0–52.0)
HEMOGLOBIN: 14.6 g/dL (ref 13.0–17.0)
LYMPHS ABS: 2.5 10*3/uL (ref 0.7–4.0)
LYMPHS PCT: 30 % (ref 12–46)
MCH: 29.2 pg (ref 26.0–34.0)
MCHC: 34.4 g/dL (ref 30.0–36.0)
MCV: 84.8 fL (ref 78.0–100.0)
MONOS PCT: 10 % (ref 3–12)
Monocytes Absolute: 0.8 10*3/uL (ref 0.1–1.0)
NEUTROS ABS: 4.5 10*3/uL (ref 1.7–7.7)
NEUTROS PCT: 54 % (ref 43–77)
Platelets: 252 10*3/uL (ref 150–400)
RBC: 5 MIL/uL (ref 4.22–5.81)
RDW: 13.9 % (ref 11.5–15.5)
WBC: 8.3 10*3/uL (ref 4.0–10.5)

## 2013-07-22 LAB — LIPID PANEL
CHOLESTEROL: 180 mg/dL (ref 0–200)
HDL: 43 mg/dL (ref >39)
LDL Cholesterol: 110 mg/dL — ABNORMAL HIGH (ref 0–99)
TRIGLYCERIDES: 133 mg/dL (ref <150)
Total CHOL/HDL Ratio: 4.2 Ratio
VLDL: 27 mg/dL (ref 0–40)

## 2013-07-22 LAB — POCT URINALYSIS DIPSTICK
Bilirubin, UA: NEGATIVE
Blood, UA: NEGATIVE
GLUCOSE UA: NEGATIVE
Ketones, UA: NEGATIVE
Leukocytes, UA: NEGATIVE
Nitrite, UA: NEGATIVE
PROTEIN UA: NEGATIVE
Spec Grav, UA: 1.025
UROBILINOGEN UA: NEGATIVE
pH, UA: 5

## 2013-07-22 LAB — COMPREHENSIVE METABOLIC PANEL
ALT: 40 U/L (ref 0–53)
AST: 40 U/L — ABNORMAL HIGH (ref 0–37)
Albumin: 4.6 g/dL (ref 3.5–5.2)
Alkaline Phosphatase: 62 U/L (ref 39–117)
BUN: 14 mg/dL (ref 6–23)
CALCIUM: 9.7 mg/dL (ref 8.4–10.5)
CHLORIDE: 103 meq/L (ref 96–112)
CO2: 23 mEq/L (ref 19–32)
Creat: 1.08 mg/dL (ref 0.50–1.35)
GLUCOSE: 106 mg/dL — AB (ref 70–99)
Potassium: 4.7 mEq/L (ref 3.5–5.3)
Sodium: 139 mEq/L (ref 135–145)
Total Bilirubin: 0.6 mg/dL (ref 0.2–1.2)
Total Protein: 8.2 g/dL (ref 6.0–8.3)

## 2013-07-22 MED ORDER — NIACIN ER (ANTIHYPERLIPIDEMIC) 1000 MG PO TBCR: EXTENDED_RELEASE_TABLET | ORAL | Status: DC

## 2013-07-22 MED ORDER — ATORVASTATIN CALCIUM 40 MG PO TABS: ORAL_TABLET | ORAL | Status: DC

## 2013-07-22 NOTE — Patient Instructions (Signed)
  HEALTH MAINTENANCE RECOMMENDATIONS:  It is recommended that you get at least 30 minutes of aerobic exercise at least 5 days/week (for weight loss, you may need as much as 60-90 minutes). This can be any activity that gets your heart rate up. This can be divided in 10-15 minute intervals if needed, but try and build up your endurance at least once a week.  Weight bearing exercise is also recommended twice weekly.  Eat a healthy diet with lots of vegetables, fruits and fiber.  "Colorful" foods have a lot of vitamins (ie green vegetables, tomatoes, red peppers, etc).  Limit sweet tea, regular sodas and alcoholic beverages, all of which has a lot of calories and sugar.  Up to 2 alcoholic drinks daily may be beneficial for men (unless trying to lose weight, watch sugars).  Drink a lot of water.  Sunscreen of at least SPF 30 should be used on all sun-exposed parts of the skin when outside between the hours of 10 am and 4 pm (not just when at beach or pool, but even with exercise, golf, tennis, and yard work!)  Use a sunscreen that says "broad spectrum" so it covers both UVA and UVB rays, and make sure to reapply every 1-2 hours.  Remember to change the batteries in your smoke detectors when changing your clock times in the spring and fall.  Use your seat belt every time you are in a car, and please drive safely and not be distracted with cell phones and texting while driving.  Schedule nurse visit to verify the accuracy of your blood pressure monitor (at your convenience)

## 2013-07-22 NOTE — Progress Notes (Signed)
Chief Complaint  Patient presents with  . cpe    fasting physical. no other concerns to address   Timothy Estes is a 63 y.o. male who presents for a complete physical. He is also here for 6 month follow up on chronic medical problems (med check).  He lost his job in October, so has had some financial constraints.  He should start receiving social security in April, which will ease some of his financial concerns.  Hyperlipidemia follow-up: Patient is reportedly following a low-fat, low cholesterol diet. Compliant with medications and denies medication side effects. He is on atorvastatin, fish oil and Niaspan. He takes aspirin prior to taking the Niaspan, and takes it along with unsweetened applesauce. Had very mild flushing 2 months ago.  He started drinking beer a little more since not working (only once a week, but sometimes up to 2-4 beers).  Hypertension follow-up: Blood pressure was 126/82 yesterday afternoon.  128/88 this morning before leaving the house.  He has never had his BP monitor verified. Denies dizziness, headaches, chest pain. Denies side effects of medications. Denies palpitations (h/o atrial fibrillation), tachycardia, shortness of breath.   CAD--he reports he has been released from cardiologist care. No records available--other than from Dr. Lovena Le in 2012 (he states there was another cardiologist also).  He hasn't had any recurrent chest pain, no exertional dyspnea, and no recurrent tachycardia or palpitations.  +snores (per wife).  Feels well-rested in the mornings. Denies daytime somnolence  Immunization History  Administered Date(s) Administered  . Influenza Split 03/15/2012  . Influenza,inj,Quad PF,36+ Mos 01/23/2013  . Tdap 07/10/2012  . Zoster 07/10/2012   Last colonoscopy: Never. Supposed to have one set up through Dr. Lynne Leader office. Had to hold off due to financial stressors, but plans to schedule Last PSA: 02/2012 Dentist: 4 years ago. Plans to schedule ($  issues) Ophtho: never--had appointment scheduled last week but canceled due to snow. Exercise: Walks up and down driveway twice ([id number] miles) daily (not in the last couple of weeks due to snowy weather).  No weight-bearing exercise other than cutting wood.  Past Medical History  Diagnosis Date  . Syncope     in setting of AFib with RVR  . Atrial fibrillation with RVR     converted to NSR with IV dilt 10/12 - no coumadin due to need for Plavix and ASA  . Coronary artery disease     LHC 03/17/11: Dx 30%, OM1 30%, OM2 30%, pRCA 30%, EF 40-45% with anterolat and apical HK  . Hypertension   . Hyperlipidemia   . NSTEMI (non-ST elevated myocardial infarction)     FLET THAT NON-ST-ELEVATION MI IS POSSIBLY SECONDARY TO CORONARY EMBOLUS FROM HIS AFIB  . Hematochezia     LIKEY FROM HEMORRHOIDAL BLEEDING; PT HAS H/O  . Elevated LFTs   . Fatty liver     Past Surgical History  Procedure Laterality Date  . Cardiac catheterization  03/18/11  . Cardiac event monitor  04/01/11  . Transthoracic echocardiogram  03/19/11    History   Social History  . Marital Status: Married    Spouse Name: N/A    Number of Children: 0  . Years of Education: N/A   Occupational History  . 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 Smoking status: Former Smoker    Quit date: 11/24/1974  . Smokeless tobacco: Never Used  . Alcohol Use: Yes     Comment: 2-4 beers per week (once a week)  .  Drug Use: No  . Sexual Activity: Yes    Partners: Female   Other Topics Concern  . Not on file   Social History Narrative   Lives with wife, 1 dog.  He lost his job 02/2013; will be going on Social Security 08/2013    Family History  Problem Relation Age of Onset  . Heart disease Neg Hx   . Cancer Neg Hx   . Diabetes Neg Hx   . Hyperlipidemia Brother   . Hypertension Brother   . Arthritis Mother     Current outpatient prescriptions:aspirin EC 81 MG tablet, Take 81 mg by mouth daily.  ,  Disp: , Rfl: ;  atorvastatin (LIPITOR) 40 MG tablet, TAKE 1 TABLET (40 MG TOTAL) BY MOUTH DAILY., Disp: 30 tablet, Rfl: 5;  clopidogrel (PLAVIX) 75 MG tablet, TAKE 1 TABLET (75 MG TOTAL) BY MOUTH DAILY., Disp: 30 tablet, Rfl: 11;  COENZYME Q-10 PO, Take 1 tablet by mouth daily., Disp: , Rfl:  fish oil-omega-3 fatty acids 1000 MG capsule, Take 4 g by mouth daily. , Disp: , Rfl: ;  metoprolol tartrate (LOPRESSOR) 25 MG tablet, TAKE 1 TABLET (25 MG TOTAL) BY MOUTH 2 (TWO) TIMES DAILY., Disp: 60 tablet, Rfl: 5;  Misc Natural Products (OSTEO BI-FLEX TRIPLE STRENGTH PO), Take 1 tablet by mouth daily., Disp: , Rfl: ;  niacin (NIASPAN) 1000 MG CR tablet, TAKE 1 TABLET (1,000 MG TOTAL) BY MOUTH AT BEDTIME., Disp: 30 tablet, Rfl: 5 nitroGLYCERIN (NITROSTAT) 0.4 MG SL tablet, Place 1 tablet (0.4 mg total) under the tongue every 5 (five) minutes as needed for chest pain., Disp: 28 tablet, Rfl: 1  No Known Allergies  ROS: The patient denies anorexia, fever, weight changes, headaches, vision loss, decreased hearing, ear pain, hoarseness, chest pain, palpitations, dizziness, syncope, dyspnea on exertion, cough, swelling, nausea, vomiting, diarrhea, constipation, abdominal pain, melena, hematochezia, indigestion/heartburn, hematuria, incontinence, erectile dysfunction, nocturia, weakened urine stream, dysuria, genital lesions, numbness, tingling, weakness, tremor, suspicious skin lesions, depression, anxiety, abnormal bleeding/bruising, or enlarged lymph nodes. Denies memory issues. Denies joint pains.  PHYSICAL EXAM: BP 140/98  Pulse 68  Ht 5\' 11"  (1.803 m)  Wt 225 lb (102.059 kg)  BMI 31.39 kg/m2  (height with shoes on) 140/86 on repeat by MD General Appearance:  Alert, cooperative, no distress, appears older than stated age   Head:  Normocephalic, without obvious abnormality, atraumatic   Eyes:  PERRL, conjunctiva/corneas clear, EOM's intact, fundi  benign   Ears:  Normal TM's and external ear canals   Nose:   Nares normal, mucosa normal, no drainage or sinus tenderness   Throat:  Lips, mucosa, and tongue normal; significant tooth decay noted   Neck:  Supple, no lymphadenopathy; thyroid: no enlargement/tenderness/nodules; no carotid  bruit or JVD   Back:  Spine nontender, no curvature, ROM normal, no CVA tenderness   Lungs:  Clear to auscultation bilaterally without wheezes, rales or ronchi; respirations unlabored.   Chest Wall:  No tenderness or deformity   Heart:  Regular rate and rhythm, S1 and S2 normal, no murmur, rub  or gallop   Breast Exam:  No chest wall tenderness, masses or gynecomastia   Abdomen:  Soft, non-tender, nondistended, normoactive bowel sounds,  no masses, no hepatosplenomegaly   Genitalia:  Normal male external genitalia without lesions. Testicles without masses. No inguinal hernias.   Rectal:  Normal sphincter tone, no masses or tenderness; heme negative stool. Prostate smooth, no nodules, not enlarged.   Extremities:  No clubbing, cyanosis or  edema.  Pulses:  2+ and symmetric all extremities   Skin:  Skin color, texture, turgor normal, no rashes or lesions. Scrape down right shin, healing.  Lymph nodes:  Cervical, supraclavicular, and axillary nodes normal   Neurologic:  CNII-XII intact, normal strength, sensation and gait; reflexes 2+ and symmetric throughout          Psych: Normal mood, affect, hygiene and grooming.    ASSESSMENT/PLAN:  Routine general medical examination at a health care facility - Plan: POCT urinalysis dipstick, Lipid panel, Comprehensive metabolic panel, CBC with Differential, TSH, PSA  Mixed hyperlipidemia - Plan: Lipid panel, niacin (NIASPAN) 1000 MG CR tablet, atorvastatin (LIPITOR) 40 MG tablet  HTN (hypertension) - elevated in office, normal per home values.  need to verify accuracy of BP monitor--Nurse visit - Plan: Comprehensive metabolic panel  Special screening for malignant neoplasm of prostate - Plan: PSA  Encounter for long-term  (current) use of other medications - Plan: Lipid panel, Comprehensive metabolic panel, CBC with Differential, TSH, PSA  Nonobstructive CAD by Cath 02/2011 - likely needs yearly f/u with cardiologist.  Due for EKG.  Need to make sure BP is adequately treated and lipids at goal  Discussed PSA screening (risks/benefits), recommended at least 30 minutes of aerobic activity at least 5 days/week; proper sunscreen use reviewed; healthy diet and alcohol recommendations (less than or equal to 2 drinks/day) reviewed; regular seatbelt use; changing batteries in smoke detectors. Self-testicular exams. Immunization recommendations discussed--UTD. Continue yearly flu shots. Pneumovax at age 33. Colonoscopy recommendations reviewed--past due.   Schedule routine eye exam.  Schedule dental cleaning  Schedule colonoscopy.   Check with cardiologist to see if yearly appts are a good idea (I think it is a good idea).  Check with your wife to see if you stop breathing at night (while snoring).  Please try and lose weight--continue daily exercise, cut back on alcohol intake.  Eat a healthy diet, and watch your portion sizes.  Schedule nurse visit to verify the accuracy of your blood pressure monitor (at your convenience)

## 2013-07-23 LAB — TSH: TSH: 2.701 u[IU]/mL (ref 0.350–4.500)

## 2013-07-23 LAB — PSA: PSA: 0.38 ng/mL (ref <=4.00)

## 2013-07-31 ENCOUNTER — Encounter: Payer: Self-pay | Admitting: Family Medicine

## 2013-07-31 ENCOUNTER — Ambulatory Visit (INDEPENDENT_AMBULATORY_CARE_PROVIDER_SITE_OTHER): Payer: BC Managed Care – PPO | Admitting: Family Medicine

## 2013-07-31 VITALS — BP 144/80 | HR 68 | Ht 71.0 in | Wt 226.0 lb

## 2013-07-31 DIAGNOSIS — I2510 Atherosclerotic heart disease of native coronary artery without angina pectoris: Secondary | ICD-10-CM

## 2013-07-31 DIAGNOSIS — I10 Essential (primary) hypertension: Secondary | ICD-10-CM

## 2013-07-31 DIAGNOSIS — E785 Hyperlipidemia, unspecified: Secondary | ICD-10-CM

## 2013-07-31 DIAGNOSIS — R7301 Impaired fasting glucose: Secondary | ICD-10-CM

## 2013-07-31 LAB — POCT GLYCOSYLATED HEMOGLOBIN (HGB A1C): Hemoglobin A1C: 5.1

## 2013-07-31 NOTE — Patient Instructions (Signed)
Routine follow up with cardiologist is recommended.  Your EKG was unchanged from that in 2012.  Cut back on sweets, candy, alcohol, soda, juices.  Eat more fruits and vegetables.  Daily exercise and weight loss is encouraged. Cut back on the cholesterol in your diet  Continue to monitor your blood pressure periodically at home--your machine seems accurate.  Fat and Cholesterol Control Diet Fat and cholesterol levels in your blood and organs are influenced by your diet. High levels of fat and cholesterol may lead to diseases of the heart, small and large blood vessels, gallbladder, liver, and pancreas. CONTROLLING FAT AND CHOLESTEROL WITH DIET Although exercise and lifestyle factors are important, your diet is key. That is because certain foods are known to raise cholesterol and others to lower it. The goal is to balance foods for their effect on cholesterol and more importantly, to replace saturated and trans fat with other types of fat, such as monounsaturated fat, polyunsaturated fat, and omega-3 fatty acids. On average, a person should consume no more than 15 to 17 g of saturated fat daily. Saturated and trans fats are considered "bad" fats, and they will raise LDL cholesterol. Saturated fats are primarily found in animal products such as meats, butter, and cream. However, that does not mean you need to give up all your favorite foods. Today, there are good tasting, low-fat, low-cholesterol substitutes for most of the things you like to eat. Choose low-fat or nonfat alternatives. Choose round or loin cuts of red meat. These types of cuts are lowest in fat and cholesterol. Chicken (without the skin), fish, veal, and ground [place] breast are great choices. Eliminate fatty meats, such as hot dogs and salami. Even shellfish have little or no saturated fat. Have a 3 oz (85 g) portion when you eat lean meat, poultry, or fish. Trans fats are also called "partially hydrogenated oils." They are oils that have  been scientifically manipulated so that they are solid at room temperature resulting in a longer shelf life and improved taste and texture of foods in which they are added. Trans fats are found in stick margarine, some tub margarines, cookies, crackers, and baked goods.  When baking and cooking, oils are a great substitute for butter. The monounsaturated oils are especially beneficial since it is believed they lower LDL and raise HDL. The oils you should avoid entirely are saturated tropical oils, such as coconut and palm.  Remember to eat a lot from food groups that are naturally free of saturated and trans fat, including fish, fruit, vegetables, beans, grains (barley, rice, couscous, bulgur wheat), and pasta (without cream sauces).  IDENTIFYING FOODS THAT LOWER FAT AND CHOLESTEROL  Soluble fiber may lower your cholesterol. This type of fiber is found in fruits such as apples, vegetables such as broccoli, potatoes, and carrots, legumes such as beans, peas, and lentils, and grains such as barley. Foods fortified with plant sterols (phytosterol) may also lower cholesterol. You should eat at least 2 g per day of these foods for a cholesterol lowering effect.  Read package labels to identify low-saturated fats, trans fat free, and low-fat foods at the supermarket. Select cheeses that have only 2 to 3 g saturated fat per ounce. Use a heart-healthy tub margarine that is free of trans fats or partially hydrogenated oil. When buying baked goods (cookies, crackers), avoid partially hydrogenated oils. Breads and muffins should be made from whole grains (whole-wheat or whole oat flour, instead of "flour" or "enriched flour"). Buy non-creamy canned soups with  reduced salt and no added fats.  FOOD PREPARATION TECHNIQUES  Never deep-fry. If you must fry, either stir-fry, which uses very little fat, or use non-stick cooking sprays. When possible, broil, bake, or roast meats, and steam vegetables. Instead of putting butter  or margarine on vegetables, use lemon and herbs, applesauce, and cinnamon (for squash and sweet potatoes). Use nonfat yogurt, salsa, and low-fat dressings for salads.  LOW-SATURATED FAT / LOW-FAT FOOD SUBSTITUTES Meats / Saturated Fat (g)  Avoid: Steak, marbled (3 oz/85 g) / 11 g  Choose: Steak, lean (3 oz/85 g) / 4 g  Avoid: Hamburger (3 oz/85 g) / 7 g  Choose: Hamburger, lean (3 oz/85 g) / 5 g  Avoid: Ham (3 oz/85 g) / 6 g  Choose: Ham, lean cut (3 oz/85 g) / 2.4 g  Avoid: Chicken, with skin, dark meat (3 oz/85 g) / 4 g  Choose: Chicken, skin removed, dark meat (3 oz/85 g) / 2 g  Avoid: Chicken, with skin, light meat (3 oz/85 g) / 2.5 g  Choose: Chicken, skin removed, light meat (3 oz/85 g) / 1 g Dairy / Saturated Fat (g)  Avoid: Whole milk (1 cup) / 5 g  Choose: Low-fat milk, 2% (1 cup) / 3 g  Choose: Low-fat milk, 1% (1 cup) / 1.5 g  Choose: Skim milk (1 cup) / 0.3 g  Avoid: Hard cheese (1 oz/28 g) / 6 g  Choose: Skim milk cheese (1 oz/28 g) / 2 to 3 g  Avoid: Cottage cheese, 4% fat (1 cup) / 6.5 g  Choose: Low-fat cottage cheese, 1% fat (1 cup) / 1.5 g  Avoid: Ice cream (1 cup) / 9 g  Choose: Sherbet (1 cup) / 2.5 g  Choose: Nonfat frozen yogurt (1 cup) / 0.3 g  Choose: Frozen fruit bar / trace  Avoid: Whipped cream (1 tbs) / 3.5 g  Choose: Nondairy whipped topping (1 tbs) / 1 g Condiments / Saturated Fat (g)  Avoid: Mayonnaise (1 tbs) / 2 g  Choose: Low-fat mayonnaise (1 tbs) / 1 g  Avoid: Butter (1 tbs) / 7 g  Choose: Extra light margarine (1 tbs) / 1 g  Avoid: Coconut oil (1 tbs) / 11.8 g  Choose: Olive oil (1 tbs) / 1.8 g  Choose: Corn oil (1 tbs) / 1.7 g  Choose: Safflower oil (1 tbs) / 1.2 g  Choose: Sunflower oil (1 tbs) / 1.4 g  Choose: Soybean oil (1 tbs) / 2.4 g  Choose: Canola oil (1 tbs) / 1 g Document Released: 05/09/2005 Document Revised: 09/03/2012 Document Reviewed: 10/28/2010 Exit[employer] Patient Information 2014  [place], [place].

## 2013-07-31 NOTE — Progress Notes (Signed)
Chief Complaint  Patient presents with  . Follow-up    lab follow up, check bp monitor and EKG. Checked blood sugar this morning fasting and it was 109 and 107 yesterday.  Needs new rx for nitro send to pharmacy.    Patient presents for follow up on his labs, as well as for routine EKG (that wasn't done at his recent CPE).  He brings in his BP monitor to be checked for accuracy.  BP's at home are running 130/85; this morning is was 123/80.  In the office today, his machine read 155/102.  He admits that he has been on a "candy kick" since before Christmas. He stopped eating sweets 2 weeks ago.  He has been eating more cheese than previously, and ate a lot of steak and roastbeef through the holidays.  He thinks these dietary changes account for why his cholesterol was higher, and his sugars were high.  He checked his blood sugar at 6:30 this morning with his wife's machine and it was 109; it was 107 yesterday.  Denies polydipsia, polyuria.  He reports being compliant with his lipid-lowering medications.  Past Medical History  Diagnosis Date  . Syncope     in setting of AFib with RVR  . Atrial fibrillation with RVR     converted to NSR with IV dilt 10/12 - no coumadin due to need for Plavix and ASA  . Coronary artery disease     LHC 03/17/11: Dx 30%, OM1 30%, OM2 30%, pRCA 30%, EF 40-45% with anterolat and apical HK  . Hypertension   . Hyperlipidemia   . NSTEMI (non-ST elevated myocardial infarction)     FLET THAT NON-ST-ELEVATION MI IS POSSIBLY SECONDARY TO CORONARY EMBOLUS FROM HIS AFIB  . Hematochezia     LIKEY FROM HEMORRHOIDAL BLEEDING; PT HAS H/O  . Elevated LFTs   . Fatty liver    Past Surgical History  Procedure Laterality Date  . Cardiac catheterization  03/18/11  . Cardiac event monitor  04/01/11  . Transthoracic echocardiogram  03/19/11   History   Social History  . Marital Status: Married    Spouse Name: N/A    Number of Children: 0  . Years of Education: N/A    Occupational History  . 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 Smoking status: Former Smoker    Quit date: 11/24/1974  . Smokeless tobacco: Never Used  . Alcohol Use: Yes     Comment: 2-4 beers per week (once a week)  . Drug Use: No  . Sexual Activity: Yes    Partners: Female   Other Topics Concern  . Not on file   Social History Narrative   Lives with wife, 1 dog.  He lost his job 02/2013; will be going on Social Security 08/2013   Outpatient Encounter Prescriptions as of 07/31/2013  Medication Sig Note  . aspirin EC 81 MG tablet Take 81 mg by mouth daily.     Marland Kitchen atorvastatin (LIPITOR) 40 MG tablet TAKE 1 TABLET (40 MG TOTAL) BY MOUTH DAILY.   Marland Kitchen clopidogrel (PLAVIX) 75 MG tablet TAKE 1 TABLET (75 MG TOTAL) BY MOUTH DAILY.   Marland Kitchen COENZYME Q-10 PO Take 1 tablet by mouth daily.   . fish oil-omega-3 fatty acids 1000 MG capsule Take 4 g by mouth daily.  01/23/2013: 4800mg  daily  . metoprolol tartrate (LOPRESSOR) 25 MG tablet TAKE 1 TABLET (25 MG TOTAL) BY MOUTH 2 (TWO) TIMES DAILY.   Marland Kitchen  Misc Natural Products (OSTEO BI-FLEX TRIPLE STRENGTH PO) Take 1 tablet by mouth daily.   . niacin (NIASPAN) 1000 MG CR tablet TAKE 1 TABLET (1,000 MG TOTAL) BY MOUTH AT BEDTIME.   . nitroGLYCERIN (NITROSTAT) 0.4 MG SL tablet Place 1 tablet (0.4 mg total) under the tongue every 5 (five) minutes as needed for chest pain.    No Known Allergies  ROS:  No headaches, dizziness, URI symptoms, chest pain, palpitations, bleeding, bruising, cough, shortness of breath, edema or other concerns.  PHYSICAL EXAM: BP 144/80  Pulse 68  Ht 5\' 11"  (1.803 m)  Wt 226 lb (102.513 kg)  BMI 31.53 kg/m2 144/80 on repeat by MD, RA at end of visit Well developed, pleasant male in no distress. Remainder of visit limited to counseling.  Lab Results  Component Value Date   CHOL 180 07/22/2013   HDL 43 07/22/2013   LDLCALC 110* 07/22/2013   TRIG 133 07/22/2013   CHOLHDL 4.2 07/22/2013     Chemistry       Component Value Date/Time   NA 139 07/22/2013 0925   K 4.7 07/22/2013 0925   CL 103 07/22/2013 0925   CO2 23 07/22/2013 0925   BUN 14 07/22/2013 0925   CREATININE 1.08 07/22/2013 0925   CREATININE 1.21 03/19/2011 0630      Component Value Date/Time   CALCIUM 9.7 07/22/2013 0925   ALKPHOS 62 07/22/2013 0925   AST 40* 07/22/2013 0925   ALT 40 07/22/2013 0925   BILITOT 0.6 07/22/2013 0925     Glucose 106 (fasting)  Lab Results  Component Value Date   WBC 8.3 07/22/2013   HGB 14.6 07/22/2013   HCT 42.4 07/22/2013   MCV 84.8 07/22/2013   PLT 252 07/22/2013   Lab Results  Component Value Date   TSH 2.701 07/22/2013   Lab Results  Component Value Date   PSA 0.38 07/22/2013   PSA 0.41 03/15/2012   A1c today is 5.1%   ASSESSMENT/PLAN:   HTN (hypertension) - has white coat component.  BP improved on recheck; machine is fairly accurate. Continue monitoring at home  IFG (impaired fasting glucose) - reviewed lowfat, low sugar diet, cut back on carbs.  continue regular exercise; weight loss encouraged - Plan: HgB A1c, Glucose, random  HLD (hyperlipidemia) - previously at goal with LDL<100 on same meds. Has had dietary changes.  reviewed low cholesterol diet in detail.  recheck 3 mos - Plan: Lipid panel  Nonobstructive CAD by Cath 02/2011 - EKG unchanged.  f/u with cardiologist for routine followup - Plan: EKG 12-Lead  Counseled extensively re: diet, exercise, weight loss, IFG vs DM, and lipid goals.  30 min visit, all counseling.  Lab visit 3 months for lipids and glucose OV/med check in 6 months as scheduled

## 2013-08-07 ENCOUNTER — Telehealth: Payer: Self-pay | Admitting: Family Medicine

## 2013-08-07 DIAGNOSIS — I2510 Atherosclerotic heart disease of native coronary artery without angina pectoris: Secondary | ICD-10-CM

## 2013-08-07 MED ORDER — NITROGLYCERIN 0.4 MG SL SUBL: 0.4000 mg | SUBLINGUAL_TABLET | SUBLINGUAL | Status: DC | PRN

## 2013-08-07 NOTE — Telephone Encounter (Signed)
Pt needs refill on nitroglycerin  Sent to [facility]  In [place]

## 2013-08-07 NOTE — Telephone Encounter (Signed)
done

## 2013-10-25 ENCOUNTER — Telehealth: Payer: Self-pay | Admitting: Family Medicine

## 2013-10-25 ENCOUNTER — Other Ambulatory Visit: Payer: Self-pay

## 2013-10-25 NOTE — Telephone Encounter (Signed)
Refilled Lopressor x 1 month

## 2013-10-25 NOTE — Telephone Encounter (Signed)
Refilled Lopressor 25 mg #60, I tablet BID, no refill/WL

## 2013-11-11 ENCOUNTER — Other Ambulatory Visit: Payer: BC Managed Care – PPO

## 2013-11-11 DIAGNOSIS — E785 Hyperlipidemia, unspecified: Secondary | ICD-10-CM

## 2013-11-11 DIAGNOSIS — R7301 Impaired fasting glucose: Secondary | ICD-10-CM

## 2013-11-11 LAB — LIPID PANEL
CHOLESTEROL: 152 mg/dL (ref 0–200)
HDL: 36 mg/dL — ABNORMAL LOW (ref >39)
LDL Cholesterol: 74 mg/dL (ref 0–99)
TRIGLYCERIDES: 208 mg/dL — AB (ref <150)
Total CHOL/HDL Ratio: 4.2 Ratio
VLDL: 42 mg/dL — ABNORMAL HIGH (ref 0–40)

## 2013-11-11 LAB — GLUCOSE, RANDOM: Glucose, Bld: 101 mg/dL — ABNORMAL HIGH (ref 70–99)

## 2013-11-14 MED ORDER — METOPROLOL TARTRATE 25 MG PO TABS: ORAL_TABLET | ORAL | Status: DC

## 2014-01-07 ENCOUNTER — Other Ambulatory Visit: Payer: Self-pay | Admitting: Family Medicine

## 2014-01-13 ENCOUNTER — Other Ambulatory Visit: Payer: Self-pay | Admitting: Family Medicine

## 2014-01-22 ENCOUNTER — Ambulatory Visit (INDEPENDENT_AMBULATORY_CARE_PROVIDER_SITE_OTHER): Payer: BC Managed Care – PPO | Admitting: Family Medicine

## 2014-01-22 ENCOUNTER — Encounter: Payer: Self-pay | Admitting: Family Medicine

## 2014-01-22 VITALS — BP 150/86 | HR 60 | Ht 71.0 in | Wt 220.0 lb

## 2014-01-22 DIAGNOSIS — R7301 Impaired fasting glucose: Secondary | ICD-10-CM

## 2014-01-22 DIAGNOSIS — Z23 Encounter for immunization: Secondary | ICD-10-CM

## 2014-01-22 DIAGNOSIS — E782 Mixed hyperlipidemia: Secondary | ICD-10-CM

## 2014-01-22 DIAGNOSIS — I10 Essential (primary) hypertension: Secondary | ICD-10-CM

## 2014-01-22 LAB — COMPREHENSIVE METABOLIC PANEL
ALBUMIN: 4.4 g/dL (ref 3.5–5.2)
ALT: 37 U/L (ref 0–53)
AST: 36 U/L (ref 0–37)
Alkaline Phosphatase: 61 U/L (ref 39–117)
BUN: 14 mg/dL (ref 6–23)
CHLORIDE: 104 meq/L (ref 96–112)
CO2: 26 mEq/L (ref 19–32)
Calcium: 9.5 mg/dL (ref 8.4–10.5)
Creat: 1.05 mg/dL (ref 0.50–1.35)
GLUCOSE: 99 mg/dL (ref 70–99)
POTASSIUM: 4.4 meq/L (ref 3.5–5.3)
Sodium: 138 mEq/L (ref 135–145)
Total Bilirubin: 0.7 mg/dL (ref 0.2–1.2)
Total Protein: 8 g/dL (ref 6.0–8.3)

## 2014-01-22 LAB — LIPID PANEL
Cholesterol: 156 mg/dL (ref 0–200)
HDL: 37 mg/dL — AB (ref >39)
LDL CALC: 72 mg/dL (ref 0–99)
Total CHOL/HDL Ratio: 4.2 Ratio
Triglycerides: 235 mg/dL — ABNORMAL HIGH (ref <150)
VLDL: 47 mg/dL — AB (ref 0–40)

## 2014-01-22 MED ORDER — ATORVASTATIN CALCIUM 40 MG PO TABS: ORAL_TABLET | ORAL | Status: DC

## 2014-01-22 NOTE — Progress Notes (Signed)
Subjective:     Patient ID: Timothy Estes, male   DOB: 22-Aug-1950, 63 y.o.   MRN: [id number]  Timothy Estes presents for Hypertension  Chief Complaint  Patient presents with  . Hypertension    fasting med check.    Patient presents for f/u on HTN and hyperlipidemia.  Hypertension follow-up:  Blood pressures elsewhere are 125/85 (yesterday), usually 125-130/mid-80's.  He was stuck in traffic for over an hour to get here for his visit today. He also hasn't taken his medication yet today (due to fasting). Denies dizziness, headaches, chest pain.  Denies side effects of medications.  Hyperlipidemia follow-up:  Patient is reportedly following a low-fat, low cholesterol diet.  Compliant with medications and denies medication side effects.  He was not at goal at his physical; recheck 3 months later (June) showed LDL at goal (74), but TG was 208 and HDL had dropped to 36.  Chol/HDL ratio remained at 4.2.  He had mildly elevated glucose (106)  with normal A1c (5.1) 6 months ago.  Repeat 3 months ago was 101. He has lost 6 pounds and improved his diet since then--he cut out candy, and cut back on potatoes/carbs (wife has DM and is now making him eat similar to her). He walks 30 minutes in the afternoons, 4-5 days/week, plus yardwork/gardening, mows yards for others (mostly riding).  Past Medical History  Diagnosis Date  . Syncope     in setting of AFib with RVR  . Atrial fibrillation with RVR     converted to NSR with IV dilt 10/12 - no coumadin due to need for Plavix and ASA  . Coronary artery disease     LHC 03/17/11: Dx 30%, OM1 30%, OM2 30%, pRCA 30%, EF 40-45% with anterolat and apical HK  . Hypertension   . Hyperlipidemia   . NSTEMI (non-ST elevated myocardial infarction)     FLET THAT NON-ST-ELEVATION MI IS POSSIBLY SECONDARY TO CORONARY EMBOLUS FROM HIS AFIB  . Hematochezia     LIKEY FROM HEMORRHOIDAL BLEEDING; PT HAS H/O  . Elevated LFTs   . Fatty liver    Past Surgical History   Procedure Laterality Date  . Cardiac catheterization  03/18/11  . Cardiac event monitor  04/01/11  . Transthoracic echocardiogram  03/19/11   History   Social History  . Marital Status: Married    Spouse Name: N/A    Number of Children: 0  . Years of Education: N/A   Occupational History  . 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 Smoking status: Former Smoker    Quit date: 11/24/1974  . Smokeless tobacco: Never Used  . Alcohol Use: Yes     Comment: 2-4 beers per week (once a week)  . Drug Use: No  . Sexual Activity: Yes    Partners: Female   Other Topics Concern  . Not on file   Social History Narrative   Lives with wife, 1 dog.  He lost his job 02/2013; will be going on Social Security 08/2013   Outpatient Encounter Prescriptions as of 01/22/2014  Medication Sig Note  . aspirin EC 81 MG tablet Take 81 mg by mouth daily.     Marland Kitchen atorvastatin (LIPITOR) 40 MG tablet TAKE 1 TABLET (40 MG TOTAL) BY MOUTH DAILY.   Marland Kitchen clopidogrel (PLAVIX) 75 MG tablet TAKE 1 TABLET (75 MG TOTAL) BY MOUTH DAILY.   Marland Kitchen COENZYME Q-10 PO Take 1 tablet by mouth daily.   . fish oil-omega-3  fatty acids 1000 MG capsule Take 4 g by mouth daily.  01/23/2013: 4800mg  daily  . metoprolol tartrate (LOPRESSOR) 25 MG tablet TAKE 1 TABLET (25 MG TOTAL) BY MOUTH 2 (TWO) TIMES DAILY.   Marland Kitchen Misc Natural Products (OSTEO BI-FLEX TRIPLE STRENGTH PO) Take 1 tablet by mouth daily.   . niacin (NIASPAN) 1000 MG CR tablet TAKE 1 TABLET (1,000 MG TOTAL) BY MOUTH AT BEDTIME.   . nitroGLYCERIN (NITROSTAT) 0.4 MG SL tablet Place 1 tablet (0.4 mg total) under the tongue every 5 (five) minutes as needed for chest pain. 01/22/2014: Does not need; has non-expired bottle     No Known Allergies  Review of Systems  Constitutional: Negative.   HENT: Negative.   Respiratory: Negative.   Cardiovascular: Negative.   Gastrointestinal: Negative.   Endocrine: Negative.   Genitourinary: Negative.    Allergic/Immunologic: Negative.   Neurological: Negative.   Hematological: Negative for adenopathy. Does not bruise/bleed easily.  Psychiatric/Behavioral: Negative.        Objective:     BP 160/96  Pulse 60  Ht 5\' 11"  (1.803 m)  Wt 220 lb (99.791 kg)  BMI 30.70 kg/m2 150/86 on repeat by MD  Physical Exam  Nursing note and vitals reviewed. Constitutional: He is oriented to person, place, and time. He appears well-developed and well-nourished.  HENT:  Head: Normocephalic and atraumatic.  Significant tooth decay   Eyes: Conjunctivae and EOM are normal. Pupils are equal, round, and reactive to light.  Neck: Normal range of motion. Neck supple. No thyromegaly present.  No carotid bruit  Cardiovascular: Normal rate, regular rhythm, normal heart sounds and intact distal pulses.   Pulmonary/Chest: Effort normal and breath sounds normal. No respiratory distress.  Abdominal: Soft. Bowel sounds are normal. He exhibits no distension and no mass. There is no tenderness.  Musculoskeletal: He exhibits no edema and no tenderness.  Lymphadenopathy:    He has no cervical adenopathy.  Neurological: He is alert and oriented to person, place, and time. He has normal reflexes.  Skin: Skin is warm and dry. No rash noted.  Psychiatric: He has a normal mood and affect. His behavior is normal. Thought content normal.       Assessment and Plan        Essential hypertension - elevated today, but hasn't had meds, and was stressed. improved some on recheck.  Continue monitoring at home; f/u if remains elevated - Plan: Comprehensive metabolic panel  Need for prophylactic vaccination and inoculation against influenza - Plan: Flu Vaccine QUAD 36+ mos PF IM (Fluarix Quad PF)  Mixed hyperlipidemia - Due for repeat labs today. continue lowfat, low cholesterol diet. - Plan: Comprehensive metabolic panel, Lipid panel, atorvastatin (LIPITOR) 40 MG tablet  Impaired fasting glucose - continue low carb,  lowfat diet; exercise, weight loss - Plan: Comprehensive metabolic panel   Follow up with dentist  Return for CPE, med check (fasting). in 6 months

## 2014-01-22 NOTE — Patient Instructions (Signed)
Continue your current medications. Continue to lose a little more weight. Continue to get daily aerobic exercise, goal of 150-180 minutes per week minimum.  Continue lowfat, low carb, low cholesterol, low sodium diet.  Please follow up with the dentist.

## 2014-01-23 ENCOUNTER — Other Ambulatory Visit: Payer: Self-pay

## 2014-03-11 ENCOUNTER — Other Ambulatory Visit: Payer: Self-pay | Admitting: Family Medicine

## 2014-04-14 ENCOUNTER — Other Ambulatory Visit: Payer: Self-pay | Admitting: Family Medicine

## 2014-04-14 NOTE — Telephone Encounter (Signed)
Done

## 2014-05-12 ENCOUNTER — Other Ambulatory Visit: Payer: Self-pay | Admitting: Family Medicine

## 2014-06-02 ENCOUNTER — Other Ambulatory Visit: Payer: Self-pay | Admitting: Family Medicine

## 2014-07-24 ENCOUNTER — Encounter: Payer: Self-pay | Admitting: Family Medicine

## 2014-07-24 ENCOUNTER — Ambulatory Visit (INDEPENDENT_AMBULATORY_CARE_PROVIDER_SITE_OTHER): Payer: BC Managed Care – PPO | Admitting: Family Medicine

## 2014-07-24 VITALS — BP 132/72 | HR 76 | Ht 70.0 in | Wt 216.0 lb

## 2014-07-24 DIAGNOSIS — Z125 Encounter for screening for malignant neoplasm of prostate: Secondary | ICD-10-CM | POA: Diagnosis not present

## 2014-07-24 DIAGNOSIS — E782 Mixed hyperlipidemia: Secondary | ICD-10-CM | POA: Diagnosis not present

## 2014-07-24 DIAGNOSIS — Z0000 Encounter for general adult medical examination without abnormal findings: Secondary | ICD-10-CM

## 2014-07-24 DIAGNOSIS — I2510 Atherosclerotic heart disease of native coronary artery without angina pectoris: Secondary | ICD-10-CM

## 2014-07-24 DIAGNOSIS — Z5181 Encounter for therapeutic drug level monitoring: Secondary | ICD-10-CM | POA: Diagnosis not present

## 2014-07-24 DIAGNOSIS — R7301 Impaired fasting glucose: Secondary | ICD-10-CM | POA: Diagnosis not present

## 2014-07-24 DIAGNOSIS — I10 Essential (primary) hypertension: Secondary | ICD-10-CM

## 2014-07-24 LAB — COMPREHENSIVE METABOLIC PANEL
ALBUMIN: 4.5 g/dL (ref 3.5–5.2)
ALT: 36 U/L (ref 0–53)
AST: 38 U/L — ABNORMAL HIGH (ref 0–37)
Alkaline Phosphatase: 73 U/L (ref 39–117)
BUN: 17 mg/dL (ref 6–23)
CALCIUM: 10 mg/dL (ref 8.4–10.5)
CHLORIDE: 100 meq/L (ref 96–112)
CO2: 26 mEq/L (ref 19–32)
Creat: 1.14 mg/dL (ref 0.50–1.35)
GLUCOSE: 97 mg/dL (ref 70–99)
Potassium: 4.8 mEq/L (ref 3.5–5.3)
SODIUM: 137 meq/L (ref 135–145)
TOTAL PROTEIN: 8.7 g/dL — AB (ref 6.0–8.3)
Total Bilirubin: 0.9 mg/dL (ref 0.2–1.2)

## 2014-07-24 LAB — CBC WITH DIFFERENTIAL/PLATELET
Basophils Absolute: 0.1 10*3/uL (ref 0.0–0.1)
Basophils Relative: 1 % (ref 0–1)
Eosinophils Absolute: 0.5 10*3/uL (ref 0.0–0.7)
Eosinophils Relative: 5 % (ref 0–5)
HCT: 43.4 % (ref 39.0–52.0)
HEMOGLOBIN: 14.5 g/dL (ref 13.0–17.0)
Lymphocytes Relative: 27 % (ref 12–46)
Lymphs Abs: 2.5 10*3/uL (ref 0.7–4.0)
MCH: 28 pg (ref 26.0–34.0)
MCHC: 33.4 g/dL (ref 30.0–36.0)
MCV: 83.9 fL (ref 78.0–100.0)
MPV: 9.1 fL (ref 8.6–12.4)
Monocytes Absolute: 0.7 10*3/uL (ref 0.1–1.0)
Monocytes Relative: 8 % (ref 3–12)
NEUTROS PCT: 59 % (ref 43–77)
Neutro Abs: 5.4 10*3/uL (ref 1.7–7.7)
Platelets: 240 10*3/uL (ref 150–400)
RBC: 5.17 MIL/uL (ref 4.22–5.81)
RDW: 14.2 % (ref 11.5–15.5)
WBC: 9.2 10*3/uL (ref 4.0–10.5)

## 2014-07-24 LAB — POCT URINALYSIS DIPSTICK
Bilirubin, UA: NEGATIVE
Blood, UA: NEGATIVE
Glucose, UA: NEGATIVE
KETONES UA: NEGATIVE
Leukocytes, UA: NEGATIVE
NITRITE UA: NEGATIVE
Protein, UA: NEGATIVE
Spec Grav, UA: >=1.03
UROBILINOGEN UA: NEGATIVE
pH, UA: 6

## 2014-07-24 LAB — HEMOGLOBIN A1C
Hgb A1c MFr Bld: 5.5 % (ref <5.7)
Mean Plasma Glucose: 111 mg/dL (ref <117)

## 2014-07-24 LAB — LIPID PANEL
Cholesterol: 174 mg/dL (ref 0–200)
HDL: 39 mg/dL — ABNORMAL LOW (ref >=40)
LDL CALC: 104 mg/dL — AB (ref 0–99)
Total CHOL/HDL Ratio: 4.5 Ratio
Triglycerides: 154 mg/dL — ABNORMAL HIGH (ref <150)
VLDL: 31 mg/dL (ref 0–40)

## 2014-07-24 LAB — TSH: TSH: 2.731 u[IU]/mL (ref 0.350–4.500)

## 2014-07-24 MED ORDER — ATORVASTATIN CALCIUM 40 MG PO TABS: ORAL_TABLET | ORAL | Status: DC

## 2014-07-24 MED ORDER — CLOPIDOGREL BISULFATE 75 MG PO TABS: ORAL_TABLET | ORAL | Status: DC

## 2014-07-24 MED ORDER — NIACIN ER (ANTIHYPERLIPIDEMIC) 1000 MG PO TBCR: EXTENDED_RELEASE_TABLET | ORAL | Status: DC

## 2014-07-24 MED ORDER — METOPROLOL TARTRATE 25 MG PO TABS: ORAL_TABLET | ORAL | Status: DC

## 2014-07-24 NOTE — Progress Notes (Signed)
Chief Complaint  Patient presents with  . Annual Exam    fasting annual exam/med check. Has a bump on his right and left foot-can you please take a look at.    Timothy Estes is a 64 y.o. male who presents for a complete physical.  He has the following concerns:  Hypertension follow-up: Blood pressures elsewhere are 125-130's/mid-80's. 138/85 this morning.. Denies dizziness, headaches, chest pain, edema. Denies side effects of medications.  Hyperlipidemia follow-up: Patient is reportedly following a low-fat, low cholesterol diet. Compliant with medications and denies medication side effects.  He is eating more fruit. He uses 1% milk, has cereal every morning.  He has 4-5 eggs/week, and some cheese.  Red meat 2x/week.  Impaired fasting glucose--noted at physical 1 year ago. He had mildly elevated glucose (106) with normal A1c (5.1).  He has lost some weight and improved his diet since then--he cut out candy, and cut back on potatoes/carbs (wife has DM and is now making him eat similar to her). Alcohol intake increased some--having 4-6 beers per week now (on weekends), up from 2-4.  CAD--he reported being released from cardiologist care a while ago. No records available--other than from Dr. Lovena Le in 2012 (he states there was another cardiologist also). He hasn't had any recurrent chest pain, no exertional dyspnea, and no recurrent tachycardia or palpitations.  He was advised to call cardiologist last year to schedule routine check (?when needs additional stress testing, ongoing plavix?) but admits he hasn't done that.  Immunization History  Administered Date(s) Administered  . Influenza Split 03/15/2012  . Influenza,inj,Quad PF,36+ Mos 01/23/2013, 01/22/2014  . Tdap 07/10/2012  . Zoster 07/10/2012   Last colonoscopy: Never. Supposed to have one set up through Dr. Lynne Leader office. Had to hold off due to financial stressors, but plans to schedule.  He thinks he has his finances straightened out  and plans to schedule. Last PSA: 07/2013 Dentist: 5 years ago. Plans to schedule ($ issues).  He had a cleaning done at a clinic in [place] over the summer. Ophtho: never--had appointment scheduled last year, but canceled due to snow. Now has appointment later this month. Exercise: Walks up and down driveway 2-3 times [facility]--47-09 mins. No weight-bearing exercise other than cutting wood.  Past Medical History  Diagnosis Date  . Syncope     in setting of AFib with RVR  . Atrial fibrillation with RVR     converted to NSR with IV dilt 10/12 - no coumadin due to need for Plavix and ASA  . Coronary artery disease     LHC 03/17/11: Dx 30%, OM1 30%, OM2 30%, pRCA 30%, EF 40-45% with anterolat and apical HK  . Hypertension   . Hyperlipidemia   . NSTEMI (non-ST elevated myocardial infarction)     FLET THAT NON-ST-ELEVATION MI IS POSSIBLY SECONDARY TO CORONARY EMBOLUS FROM HIS AFIB  . Hematochezia     LIKEY FROM HEMORRHOIDAL BLEEDING; PT HAS H/O  . Elevated LFTs   . Fatty liver     Past Surgical History  Procedure Laterality Date  . Cardiac catheterization  03/18/11  . Cardiac event monitor  04/01/11  . Transthoracic echocardiogram  03/19/11    History   Social History  . Marital Status: Married    Spouse Name: N/A  . Number of Children: 0  . Years of Education: N/A   Occupational History  . 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 Smoking status: Former Audiological scientist  date: 11/24/1974  . Smokeless tobacco: Never Used  . Alcohol Use: 0.0 oz/week    0 Standard drinks or equivalent per week     Comment: 6 beers per week   . Drug Use: No  . Sexual Activity:    Partners: Female   Other Topics Concern  . Not on file   Social History Narrative   Lives with wife, 1 dog.  He lost his job 02/2013;  Does some survey work on the side.     Family History  Problem Relation Age of Onset  . Heart disease Neg Hx   . Cancer Neg Hx   . Diabetes Neg Hx    . Hyperlipidemia Brother   . Hypertension Brother   . Arthritis Mother     Outpatient Encounter Prescriptions as of 07/24/2014  Medication Sig Note  . aspirin EC 81 MG tablet Take 81 mg by mouth daily.     Marland Kitchen atorvastatin (LIPITOR) 40 MG tablet TAKE 1 TABLET (40 MG TOTAL) BY MOUTH DAILY.   Marland Kitchen clopidogrel (PLAVIX) 75 MG tablet TAKE 1 TABLET (75 MG TOTAL) BY MOUTH DAILY.   Marland Kitchen COENZYME Q-10 PO Take 1 tablet by mouth daily.   . fish oil-omega-3 fatty acids 1000 MG capsule Take 4 g by mouth daily.  01/23/2013: 4852m daily  . metoprolol tartrate (LOPRESSOR) 25 MG tablet TAKE 1 TABLET (25 MG TOTAL) BY MOUTH 2 (TWO) TIMES DAILY.   .Marland KitchenMisc Natural Products (OSTEO BI-FLEX TRIPLE STRENGTH PO) Take 1 tablet by mouth daily.   . niacin (NIASPAN) 1000 MG CR tablet TAKE 1 TABLET (1,000 MG TOTAL) BY MOUTH AT BEDTIME.   . nitroGLYCERIN (NITROSTAT) 0.4 MG SL tablet Place 1 tablet (0.4 mg total) under the tongue every 5 (five) minutes as needed for chest pain. (Patient not taking: Reported on 07/24/2014) 01/22/2014: Does not need; has non-expired bottle    No Known Allergies  ROS: The patient denies anorexia, fever, headaches, decreased hearing, ear pain, hoarseness, chest pain, palpitations, dizziness, syncope, dyspnea on exertion, cough, swelling, nausea, vomiting, diarrhea, constipation, abdominal pain, melena, hematochezia, indigestion/heartburn, hematuria, incontinence, erectile dysfunction, nocturia, weakened urine stream, dysuria, genital lesions, numbness, tingling, weakness, tremor, suspicious skin lesions, depression, anxiety, abnormal bleeding/bruising, or enlarged lymph nodes. Denies memory issues. Denies joint pains. +snores (per wife). Feels well-rested in the mornings. Denies daytime somnolence.  Wife denies apnea. Lost 10# since his last physical. Intermittently has a slight "fog" in his left eye.  Has eye doctor appointment scheduled soon.   PHYSICAL EXAM:  BP 150/90 mmHg  Pulse 76  Ht _0   (1.778 m)  Wt 216 lb (97.977 kg)  BMI 30.99 kg/m2 132/72 on repeat by MD  General Appearance:  Alert, cooperative, no distress, appears older than stated age   Head:  Normocephalic, without obvious abnormality, atraumatic   Eyes:  PERRL, conjunctiva/corneas clear, EOM's intact, fundi  benign   Ears:  Normal TM's and external ear canals   Nose:  Nares normal, mucosa normal, no drainage or sinus tenderness   Throat:  Lips, mucosa, and tongue normal; significant tooth decay noted (diffuse, but especially upper left front teeth)   Neck:  Supple, no lymphadenopathy; thyroid: no enlargement/tenderness/nodules; no carotid  bruit or JVD   Back:  Spine nontender, no curvature, ROM normal, no CVA tenderness   Lungs:  Clear to auscultation bilaterally without wheezes, rales or ronchi; respirations unlabored.   Chest Wall:  No tenderness or deformity   Heart:  Regular rate and rhythm, S1  and S2 normal, no murmur, rub  or gallop   Breast Exam:  No chest wall tenderness, masses or gynecomastia   Abdomen:  Soft, non-tender, nondistended, normoactive bowel sounds,  no masses, no hepatosplenomegaly   Genitalia:  Normal male external genitalia without lesions. Testicles without masses. No inguinal hernias.   Rectal:  Normal sphincter tone, no masses or tenderness; heme negative stool. Prostate smooth, no nodules, not enlarged.   Extremities:  No clubbing, cyanosis or edema.  Pulses:  2+ and symmetric all extremities   Skin:  Skin color, texture, turgor normal, no rashes or lesions. Dry patch of skin along right shin. No erythema  Lymph nodes:  Cervical, supraclavicular, and axillary nodes normal   Neurologic:  CNII-XII intact, normal strength, sensation and gait; reflexes 2+ and symmetric throughout    Psych:  Normal mood, affect, hygiene and grooming.          ASSESSMENT/PLAN:  Annual physical exam - Plan: Visual acuity  screening, POCT Urinalysis Dipstick, Lipid panel, Comprehensive metabolic panel, CBC with Differential/Platelet, TSH, Hemoglobin A1c, PSA  Coronary artery disease involving native coronary artery of native heart without angina pectoris - asymptomatic.  encouraged f/u with cardiologist.  No longer in afib. ?need for ongoing plavix, and ? if/when add'l stress testing needed - Plan: clopidogrel (PLAVIX) 75 MG tablet, metoprolol tartrate (LOPRESSOR) 25 MG tablet  Mixed hyperlipidemia - Plan: Lipid panel, Comprehensive metabolic panel  Essential hypertension - controlled - Plan: Comprehensive metabolic panel, metoprolol tartrate (LOPRESSOR) 25 MG tablet  Impaired fasting glucose - reviewed proper diet, daily exercise, proper diet, further weight loss - Plan: Hemoglobin A1c  Medication monitoring encounter - Plan: Lipid panel, Comprehensive metabolic panel, CBC with Differential/Platelet  Prostate cancer screening - Plan: PSA   c-met, lipid, PSA, A1c, CBC, TSH  Discussed PSA screening (risks/benefits), recommended at least 30 minutes of aerobic activity at least 5 days/week; proper sunscreen use reviewed; healthy diet and alcohol recommendations (less than or equal to 2 drinks/day) reviewed; regular seatbelt use; changing batteries in smoke detectors. Self-testicular exams. Immunization recommendations discussed--UTD. Continue yearly flu shots. Prevnar at age 60. Colonoscopy recommendations reviewed--past due.    Please schedule colonoscopy. Call cardiologist--routine follow-up is recommended. Unsure if long-term Plavix is still recommended for you, I'd like their opinion.  Keep your eye appointment. Schedule routine dental appointment.  Cut back on the beer intake--no more than 2 at a time.  Alcohol contributes to raising sugar and triglycerides, and affects the liver, so the less the better.  Moisturize your skin well.  If rash on leg not improving, use 1% hydrocortisone cream (ie  Cortaid)

## 2014-07-24 NOTE — Patient Instructions (Addendum)
  HEALTH MAINTENANCE RECOMMENDATIONS:  It is recommended that you get at least 30 minutes of aerobic exercise at least 5 days/week (for weight loss, you may need as much as 60-90 minutes). This can be any activity that gets your heart rate up. This can be divided in 10-15 minute intervals if needed, but try and build up your endurance at least once a week.  Weight bearing exercise is also recommended twice weekly.  Eat a healthy diet with lots of vegetables, fruits and fiber.  "Colorful" foods have a lot of vitamins (ie green vegetables, tomatoes, red peppers, etc).  Limit sweet tea, regular sodas and alcoholic beverages, all of which has a lot of calories and sugar.  Up to 2 alcoholic drinks daily may be beneficial for men (unless trying to lose weight, watch sugars).  Drink a lot of water.  Sunscreen of at least SPF 30 should be used on all sun-exposed parts of the skin when outside between the hours of 10 am and 4 pm (not just when at beach or pool, but even with exercise, golf, tennis, and yard work!)  Use a sunscreen that says "broad spectrum" so it covers both UVA and UVB rays, and make sure to reapply every 1-2 hours.  Remember to change the batteries in your smoke detectors when changing your clock times in the spring and fall.  Use your seat belt every time you are in a car, and please drive safely and not be distracted with cell phones and texting while driving.    Please schedule colonoscopy. Call cardiologist--routine follow-up is recommended. Unsure if long-term Plavix is still recommended for you, I'd like their opinion.  Keep your eye appointment. Schedule routine dental appointment.  Cut back on the beer intake--no more than 2 at a time.  Alcohol contributes to raising sugar and triglycerides, and affects the liver, so the less the better.   Moisturize your skin well.  If rash on leg not improving, use 1% hydrocortisone cream (ie Cortaid)

## 2014-07-25 LAB — PSA: PSA: 0.44 ng/mL (ref <=4.00)

## 2014-09-10 ENCOUNTER — Other Ambulatory Visit: Payer: Self-pay | Admitting: Family Medicine

## 2014-09-15 NOTE — Patient Instructions (Signed)
Timothy Estes  09/15/2014   Your procedure is scheduled on:  09/18/2014  Report to [facility] at 9:00 AM.  Call this number if you have problems the morning of surgery: [phone]   Remember:   Do not eat food or drink liquids after midnight.   Take these medicines the morning of surgery with A SIP OF WATER: Metoprolol, Niacin   Do not wear jewelry, make-up or nail polish.  Do not wear lotions, powders, or perfumes. You may wear deodorant.  Do not shave 48 hours prior to surgery. Men may shave face and neck.  Do not bring valuables to the hospital.  [facility] is not responsible for any belongings or valuables.               Contacts, dentures or bridgework may not be worn into surgery.  Leave suitcase in the car. After surgery it may be brought to your room.  For patients admitted to the hospital, discharge time is determined by your  treatment team.               Patients discharged the day of surgery will not be allowed to drive home.  Name and phone number of your driver:   Special Instructions: N/A   Please read over the following fact sheets that you were given: Anesthesia Post-op Instructions   PATIENT INSTRUCTIONS POST-ANESTHESIA  IMMEDIATELY FOLLOWING SURGERY:  Do not drive or operate machinery for the first twenty four hours after surgery.  Do not make any important decisions for twenty four hours after surgery or while taking narcotic pain medications or sedatives.  If you develop intractable nausea and vomiting or a severe headache please notify your doctor immediately.  FOLLOW-UP:  Please make an appointment with your surgeon as instructed. You do not need to follow up with anesthesia unless specifically instructed to do so.  WOUND CARE INSTRUCTIONS (if applicable):  Keep a dry clean dressing on the anesthesia/puncture wound site if there is drainage.  Once the wound has quit draining you may leave it open to air.  Generally you should leave the bandage intact for  twenty four hours unless there is drainage.  If the epidural site drains for more than 36-48 hours please call the anesthesia department.  QUESTIONS?:  Please feel free to call your physician or the hospital operator if you have any questions, and they will be happy to assist you.      Cataract Surgery  A cataract is a clouding of the lens of the eye. When a lens becomes cloudy, vision is reduced based on the degree and nature of the clouding. Surgery may be needed to improve vision. Surgery removes the cloudy lens and usually replaces it with a substitute lens (intraocular lens, IOL). LET YOUR EYE DOCTOR KNOW ABOUT:  Allergies to food or medicine.  Medicines taken including herbs, eye drops, over-the-counter medicines, and creams.  Use of steroids (by mouth or creams).  Previous problems with anesthetics or numbing medicine.  History of bleeding problems or blood clots.  Previous surgery.  Other health problems, including diabetes and kidney problems.  Possibility of pregnancy, if this applies. RISKS AND COMPLICATIONS  Infection.  Inflammation of the eyeball (endophthalmitis) that can spread to both eyes (sympathetic ophthalmia).  Poor wound healing.  If an IOL is inserted, it can later fall out of proper position. This is very uncommon.  Clouding of the part of your eye that holds an IOL in place. This is called an "after-cataract."  These are uncommon but easily treated. BEFORE THE PROCEDURE  Do not eat or drink anything except small amounts of water for 8 to 12 before your surgery, or as directed by your caregiver.  Unless you are told otherwise, continue any eye drops you have been prescribed.  Talk to your primary caregiver about all other medicines that you take (both prescription and nonprescription). In some cases, you may need to stop or change medicines near the time of your surgery. This is most important if you are taking blood-thinning medicine.Do not stop  medicines unless you are told to do so.  Arrange for someone to drive you to and from the procedure.  Do not put contact lenses in either eye on the day of your surgery. PROCEDURE There is more than one method for safely removing a cataract. Your doctor can explain the differences and help determine which is best for you. Phacoemulsification surgery is the most common form of cataract surgery.  An injection is given behind the eye or eye drops are given to make this a painless procedure.  A small cut (incision) is made on the edge of the clear, dome-shaped surface that covers the front of the eye (cornea).  A tiny probe is painlessly inserted into the eye. This device gives off ultrasound waves that soften and break up the cloudy center of the lens. This makes it easier for the cloudy lens to be removed by suction.  An IOL may be implanted.  The normal lens of the eye is covered by a clear capsule. Part of that capsule is intentionally left in the eye to support the IOL.  Your surgeon may or may not use stitches to close the incision. There are other forms of cataract surgery that require a larger incision and stitches to close the eye. This approach is taken in cases where the doctor feels that the cataract cannot be easily removed using phacoemulsification. AFTER THE PROCEDURE  When an IOL is implanted, it does not need care. It becomes a permanent part of your eye and cannot be seen or felt.  Your doctor will schedule follow-up exams to check on your progress.  Review your other medicines with your doctor to see which can be resumed after surgery.  Use eye drops or take medicine as prescribed by your doctor. Document Released: 04/28/2011 Document Revised: 09/23/2013 Document Reviewed: 04/28/2011 [facility] Patient Information 2015 [place], [place]. This information is not intended to replace advice given to you by your health care provider. Make sure you discuss any questions you have  with your health care provider.

## 2014-09-16 ENCOUNTER — Encounter (HOSPITAL_COMMUNITY)
Admission: RE | Admit: 2014-09-16 | Discharge: 2014-09-16 | Disposition: A | Discharge status: Home / Self Care | Payer: BC Managed Care – PPO | Source: Ambulatory Visit | Attending: Ophthalmology | Admitting: Ophthalmology

## 2014-09-16 ENCOUNTER — Other Ambulatory Visit: Payer: Self-pay

## 2014-09-16 ENCOUNTER — Encounter (HOSPITAL_COMMUNITY): Payer: Self-pay

## 2014-09-16 DIAGNOSIS — I2510 Atherosclerotic heart disease of native coronary artery without angina pectoris: Secondary | ICD-10-CM | POA: Diagnosis not present

## 2014-09-16 DIAGNOSIS — I10 Essential (primary) hypertension: Secondary | ICD-10-CM | POA: Diagnosis not present

## 2014-09-16 DIAGNOSIS — I252 Old myocardial infarction: Secondary | ICD-10-CM | POA: Diagnosis not present

## 2014-09-16 DIAGNOSIS — H269 Unspecified cataract: Secondary | ICD-10-CM | POA: Diagnosis present

## 2014-09-16 DIAGNOSIS — Z87891 Personal history of nicotine dependence: Secondary | ICD-10-CM | POA: Diagnosis not present

## 2014-09-16 DIAGNOSIS — H25042 Posterior subcapsular polar age-related cataract, left eye: Secondary | ICD-10-CM | POA: Diagnosis not present

## 2014-09-16 DIAGNOSIS — Z79899 Other long term (current) drug therapy: Secondary | ICD-10-CM | POA: Diagnosis not present

## 2014-09-16 NOTE — Pre-Procedure Instructions (Signed)
Patient given information to sign up for my chart at home. 

## 2014-09-17 MED ORDER — LIDOCAINE HCL (PF) 1 % IJ SOLN
INTRAMUSCULAR | Status: AC
  Filled 2014-09-17: qty 2

## 2014-09-17 MED ORDER — LIDOCAINE HCL 3.5 % OP GEL
OPHTHALMIC | Status: AC
  Filled 2014-09-17: qty 1

## 2014-09-17 MED ORDER — TETRACAINE HCL 0.5 % OP SOLN
OPHTHALMIC | Status: AC
  Filled 2014-09-17: qty 2

## 2014-09-17 MED ORDER — CYCLOPENTOLATE-PHENYLEPHRINE OP SOLN OPTIME - NO CHARGE
OPHTHALMIC | Status: AC
  Filled 2014-09-17: qty 2

## 2014-09-17 MED ORDER — PHENYLEPHRINE HCL 2.5 % OP SOLN
OPHTHALMIC | Status: AC
  Filled 2014-09-17: qty 15

## 2014-09-17 MED ORDER — NEOMYCIN-POLYMYXIN-DEXAMETH 3.5-10000-0.1 OP SUSP
OPHTHALMIC | Status: AC
  Filled 2014-09-17: qty 5

## 2014-09-18 ENCOUNTER — Ambulatory Visit (HOSPITAL_COMMUNITY): Payer: BC Managed Care – PPO | Admitting: Anesthesiology

## 2014-09-18 ENCOUNTER — Encounter (HOSPITAL_COMMUNITY): Payer: Self-pay | Admitting: *Deleted

## 2014-09-18 ENCOUNTER — Ambulatory Visit (HOSPITAL_COMMUNITY)
Admission: RE | Admit: 2014-09-18 | Discharge: 2014-09-18 | Disposition: A | Discharge status: Home / Self Care | Payer: BC Managed Care – PPO | Source: Ambulatory Visit | Attending: Ophthalmology | Admitting: Ophthalmology

## 2014-09-18 ENCOUNTER — Encounter (HOSPITAL_COMMUNITY)
Admission: RE | Disposition: A | Discharge status: Home / Self Care | Payer: Self-pay | Source: Ambulatory Visit | Attending: Ophthalmology

## 2014-09-18 DIAGNOSIS — I252 Old myocardial infarction: Secondary | ICD-10-CM | POA: Insufficient documentation

## 2014-09-18 DIAGNOSIS — Z79899 Other long term (current) drug therapy: Secondary | ICD-10-CM | POA: Insufficient documentation

## 2014-09-18 DIAGNOSIS — H25042 Posterior subcapsular polar age-related cataract, left eye: Secondary | ICD-10-CM | POA: Insufficient documentation

## 2014-09-18 DIAGNOSIS — Z87891 Personal history of nicotine dependence: Secondary | ICD-10-CM | POA: Insufficient documentation

## 2014-09-18 DIAGNOSIS — I2510 Atherosclerotic heart disease of native coronary artery without angina pectoris: Secondary | ICD-10-CM | POA: Insufficient documentation

## 2014-09-18 DIAGNOSIS — I10 Essential (primary) hypertension: Secondary | ICD-10-CM | POA: Insufficient documentation

## 2014-09-18 HISTORY — PX: CATARACT EXTRACTION W/PHACO: SHX586

## 2014-09-18 SURGERY — PHACOEMULSIFICATION, CATARACT, WITH IOL INSERTION
Anesthesia: Monitor Anesthesia Care | Site: Eye | Laterality: Left

## 2014-09-18 MED ORDER — POVIDONE-IODINE 5 % OP SOLN
OPHTHALMIC | Status: DC | PRN
  Administered 2014-09-18: 1 via OPHTHALMIC

## 2014-09-18 MED ORDER — BSS IO SOLN
INTRAOCULAR | Status: DC | PRN
  Administered 2014-09-18: 15 mL

## 2014-09-18 MED ORDER — FENTANYL CITRATE (PF) 100 MCG/2ML IJ SOLN
INTRAMUSCULAR | Status: AC
  Filled 2014-09-18: qty 2

## 2014-09-18 MED ORDER — MIDAZOLAM HCL 5 MG/5ML IJ SOLN
INTRAMUSCULAR | Status: DC | PRN
  Administered 2014-09-18: 2 mg via INTRAVENOUS

## 2014-09-18 MED ORDER — EPINEPHRINE HCL 1 MG/ML IJ SOLN
INTRAOCULAR | Status: DC | PRN
  Administered 2014-09-18: 500 mL

## 2014-09-18 MED ORDER — PHENYLEPHRINE HCL 2.5 % OP SOLN
1.0000 [drp] | OPHTHALMIC | Status: AC
  Administered 2014-09-18 (×3): 1 [drp] via OPHTHALMIC

## 2014-09-18 MED ORDER — MIDAZOLAM HCL 2 MG/2ML IJ SOLN
1.0000 mg | INTRAMUSCULAR | Status: DC | PRN
  Administered 2014-09-18: 2 mg via INTRAVENOUS

## 2014-09-18 MED ORDER — PROVISC 10 MG/ML IO SOLN
INTRAOCULAR | Status: DC | PRN
  Administered 2014-09-18: 0.85 mL via INTRAOCULAR

## 2014-09-18 MED ORDER — FENTANYL CITRATE (PF) 100 MCG/2ML IJ SOLN
25.0000 ug | INTRAMUSCULAR | Status: AC
  Administered 2014-09-18: 25 ug via INTRAVENOUS

## 2014-09-18 MED ORDER — CYCLOPENTOLATE-PHENYLEPHRINE 0.2-1 % OP SOLN
1.0000 [drp] | OPHTHALMIC | Status: AC
  Administered 2014-09-18 (×3): 1 [drp] via OPHTHALMIC

## 2014-09-18 MED ORDER — LACTATED RINGERS IV SOLN
INTRAVENOUS | Status: DC
  Administered 2014-09-18: 1000 mL via INTRAVENOUS

## 2014-09-18 MED ORDER — MIDAZOLAM HCL 2 MG/2ML IJ SOLN
INTRAMUSCULAR | Status: AC
  Filled 2014-09-18: qty 2

## 2014-09-18 MED ORDER — NEOMYCIN-POLYMYXIN-DEXAMETH 3.5-10000-0.1 OP SUSP
OPHTHALMIC | Status: DC | PRN
  Administered 2014-09-18: 2 [drp] via OPHTHALMIC

## 2014-09-18 MED ORDER — LIDOCAINE HCL (PF) 1 % IJ SOLN
INTRAMUSCULAR | Status: DC | PRN
  Administered 2014-09-18: .8 mL

## 2014-09-18 MED ORDER — EPINEPHRINE HCL 1 MG/ML IJ SOLN
INTRAMUSCULAR | Status: AC
  Filled 2014-09-18: qty 1

## 2014-09-18 MED ORDER — LIDOCAINE HCL 3.5 % OP GEL
1.0000 "application " | Freq: Once | OPHTHALMIC | Status: AC
  Administered 2014-09-18: 1 via OPHTHALMIC

## 2014-09-18 MED ORDER — TETRACAINE HCL 0.5 % OP SOLN
1.0000 [drp] | OPHTHALMIC | Status: AC
  Administered 2014-09-18 (×3): 1 [drp] via OPHTHALMIC

## 2014-09-18 SURGICAL SUPPLY — 34 items
CAPSULAR TENSION RING-AMO (OPHTHALMIC RELATED) IMPLANT
CLOTH BEACON ORANGE TIMEOUT ST (SAFETY) ×3 IMPLANT
EYE SHIELD UNIVERSAL CLEAR (GAUZE/BANDAGES/DRESSINGS) ×3 IMPLANT
GLOVE BIO SURGEON STRL SZ 6.5 (GLOVE) IMPLANT
GLOVE BIO SURGEONS STRL SZ 6.5 (GLOVE)
GLOVE BIOGEL PI IND STRL 6.5 (GLOVE) ×1 IMPLANT
GLOVE BIOGEL PI IND STRL 7.0 (GLOVE) IMPLANT
GLOVE BIOGEL PI IND STRL 7.5 (GLOVE) IMPLANT
GLOVE BIOGEL PI INDICATOR 6.5 (GLOVE) ×2
GLOVE BIOGEL PI INDICATOR 7.0 (GLOVE)
GLOVE BIOGEL PI INDICATOR 7.5 (GLOVE)
GLOVE ECLIPSE 6.5 STRL STRAW (GLOVE) IMPLANT
GLOVE ECLIPSE 7.0 STRL STRAW (GLOVE) IMPLANT
GLOVE ECLIPSE 7.5 STRL STRAW (GLOVE) IMPLANT
GLOVE EXAM NITRILE LRG STRL (GLOVE) ×3 IMPLANT
GLOVE EXAM NITRILE MD LF STRL (GLOVE) IMPLANT
GLOVE SKINSENSE NS SZ6.5 (GLOVE)
GLOVE SKINSENSE NS SZ7.0 (GLOVE)
GLOVE SKINSENSE STRL SZ6.5 (GLOVE) IMPLANT
GLOVE SKINSENSE STRL SZ7.0 (GLOVE) IMPLANT
KIT VITRECTOMY (OPHTHALMIC RELATED) IMPLANT
PAD ARMBOARD 7.5X6 YLW CONV (MISCELLANEOUS) ×3 IMPLANT
PROC W NO LENS (INTRAOCULAR LENS)
PROC W SPEC LENS (INTRAOCULAR LENS)
PROCESS W NO LENS (INTRAOCULAR LENS) IMPLANT
PROCESS W SPEC LENS (INTRAOCULAR LENS) IMPLANT
RETRACTOR IRIS SIGHTPATH (OPHTHALMIC RELATED) IMPLANT
RING MALYGIN (MISCELLANEOUS) IMPLANT
SIGHTPATH CAT PROC W REG LENS (Ophthalmic Related) ×3 IMPLANT
SYRINGE LUER LOK 1CC (MISCELLANEOUS) ×3 IMPLANT
TAPE SURG TRANSPORE 1 IN (GAUZE/BANDAGES/DRESSINGS) ×1 IMPLANT
TAPE SURGICAL TRANSPORE 1 IN (GAUZE/BANDAGES/DRESSINGS) ×2
VISCOELASTIC ADDITIONAL (OPHTHALMIC RELATED) IMPLANT
WATER STERILE IRR 250ML POUR (IV SOLUTION) ×3 IMPLANT

## 2014-09-18 NOTE — H&P (Signed)
I have reviewed the H&P, the patient was re-examined, and I have identified no interval changes in medical condition and plan of care since the history and physical of record  

## 2014-09-18 NOTE — Anesthesia Preprocedure Evaluation (Signed)
Anesthesia Evaluation  Patient identified by MRN, date of birth, ID band Patient awake    Reviewed: Allergy & Precautions, NPO status , Patient's Chart, lab work & pertinent test results  Airway Mallampati: II  TM Distance: >3 FB     Dental  (+) Teeth Intact   Pulmonary former smoker,  breath sounds clear to auscultation        Cardiovascular hypertension, Pt. on medications + CAD and + Past MI Rhythm:Regular Rate:Normal     Neuro/Psych    GI/Hepatic   Endo/Other    Renal/GU      Musculoskeletal   Abdominal   Peds  Hematology   Anesthesia Other Findings   Reproductive/Obstetrics                             Anesthesia Physical Anesthesia Plan  ASA: III  Anesthesia Plan: MAC   Post-op Pain Management:    Induction: Intravenous  Airway Management Planned: Nasal Cannula  Additional Equipment:   Intra-op Plan:   Post-operative Plan:   Informed Consent: I have reviewed the patients History and Physical, chart, labs and discussed the procedure including the risks, benefits and alternatives for the proposed anesthesia with the patient or authorized representative who has indicated his/her understanding and acceptance.     Plan Discussed with:   Anesthesia Plan Comments:         Anesthesia Quick Evaluation

## 2014-09-18 NOTE — Op Note (Signed)
Date of Admission: 09/18/2014  Date of Surgery: 09/18/2014   Pre-Op Dx: Cataract Left Eye  Post-Op Dx: Senile Posterior Subcapsular Cataract Left  Eye,  Dx Code R15.400  Surgeon: Tonny Branch, M.D.  Assistants: None  Anesthesia: Topical with MAC  Indications: Painless, progressive loss of vision with compromise of daily activities.  Surgery: Cataract Extraction with Intraocular lens Implant Left Eye  Discription: The patient had dilating drops and viscous lidocaine placed into the Left eye in the pre-op holding area. After transfer to the operating room, a time out was performed. The patient was then prepped and draped. Beginning with a 54 degree blade a paracentesis port was made at the surgeon's 2 o'clock position. The anterior chamber was then filled with 1% non-preserved lidocaine. This was followed by filling the anterior chamber with Provisc.  A 2.20mm keratome blade was used to make a clear corneal incision at the temporal limbus.  A bent cystatome needle was used to create a continuous tear capsulotomy. Hydrodissection was performed with balanced salt solution on a Fine canula. The lens nucleus was then removed using the phacoemulsification handpiece. Residual cortex was removed with the I&A handpiece. The anterior chamber and capsular bag were refilled with Provisc. A posterior chamber intraocular lens was placed into the capsular bag with it's injector. The implant was positioned with the Kuglan hook. The Provisc was then removed from the anterior chamber and capsular bag with the I&A handpiece. Stromal hydration of the main incision and paracentesis port was performed with BSS on a Fine canula. The wounds were tested for leak which was negative. The patient tolerated the procedure well. There were no operative complications. The patient was then transferred to the recovery room in stable condition.  Complications: None  Specimen: None  EBL: None  Prosthetic device: Hoya iSert 250,  power 21.0 D, SN [id number].

## 2014-09-18 NOTE — Discharge Instructions (Signed)

## 2014-09-18 NOTE — Anesthesia Postprocedure Evaluation (Signed)
  Anesthesia Post-op Note  Patient: Timothy Estes  Procedure(s) Performed: Procedure(s) with comments: CATARACT EXTRACTION PHACO AND INTRAOCULAR LENS PLACEMENT LEFT EYE (Left) - CDE 13.32  Patient Location: Short Stay  Anesthesia Type:MAC  Level of Consciousness: awake, alert , oriented and patient cooperative  Airway and Oxygen Therapy: Patient Spontanous Breathing  Post-op Pain: none  Post-op Assessment: Post-op Vital signs reviewed, Patient's Cardiovascular Status Stable, Respiratory Function Stable and Pain level controlled  Post-op Vital Signs: Reviewed and stable  Last Vitals:  Filed Vitals:   09/18/14 1025  BP: 167/103  Pulse:   Temp:   Resp: 15    Complications: No apparent anesthesia complications

## 2014-09-18 NOTE — Transfer of Care (Signed)
Immediate Anesthesia Transfer of Care Note  Patient: Timothy Estes  Procedure(s) Performed: Procedure(s) with comments: CATARACT EXTRACTION PHACO AND INTRAOCULAR LENS PLACEMENT LEFT EYE (Left) - CDE 13.32  Patient Location: Short Stay  Anesthesia Type:MAC  Level of Consciousness: awake, alert , oriented and patient cooperative  Airway & Oxygen Therapy: Patient Spontanous Breathing  Post-op Assessment: Report given to RN, Post -op Vital signs reviewed and stable and Patient moving all extremities  Post vital signs: Reviewed and stable  Last Vitals:  Filed Vitals:   09/18/14 1025  BP: 167/103  Pulse:   Temp:   Resp: 15    Complications: No apparent anesthesia complications

## 2014-09-19 ENCOUNTER — Encounter (HOSPITAL_COMMUNITY): Payer: Self-pay | Admitting: Ophthalmology

## 2014-10-13 ENCOUNTER — Other Ambulatory Visit: Payer: Self-pay | Admitting: Family Medicine

## 2015-02-05 ENCOUNTER — Encounter: Payer: Self-pay | Admitting: Family Medicine

## 2015-02-09 ENCOUNTER — Other Ambulatory Visit: Payer: Self-pay | Admitting: Family Medicine

## 2015-02-09 NOTE — Telephone Encounter (Signed)
Pt has an appt in October for fasting med check

## 2015-02-24 ENCOUNTER — Ambulatory Visit (INDEPENDENT_AMBULATORY_CARE_PROVIDER_SITE_OTHER): Payer: BC Managed Care – PPO | Admitting: Family Medicine

## 2015-02-24 ENCOUNTER — Encounter: Payer: Self-pay | Admitting: Family Medicine

## 2015-02-24 VITALS — BP 134/80 | HR 68 | Ht 70.0 in | Wt 224.0 lb

## 2015-02-24 DIAGNOSIS — Z23 Encounter for immunization: Secondary | ICD-10-CM

## 2015-02-24 DIAGNOSIS — E782 Mixed hyperlipidemia: Secondary | ICD-10-CM

## 2015-02-24 DIAGNOSIS — I10 Essential (primary) hypertension: Secondary | ICD-10-CM

## 2015-02-24 DIAGNOSIS — R7301 Impaired fasting glucose: Secondary | ICD-10-CM | POA: Diagnosis not present

## 2015-02-24 DIAGNOSIS — I2510 Atherosclerotic heart disease of native coronary artery without angina pectoris: Secondary | ICD-10-CM

## 2015-02-24 DIAGNOSIS — R7989 Other specified abnormal findings of blood chemistry: Secondary | ICD-10-CM

## 2015-02-24 DIAGNOSIS — R945 Abnormal results of liver function studies: Secondary | ICD-10-CM

## 2015-02-24 LAB — LIPID PANEL
CHOL/HDL RATIO: 4.2 ratio (ref <=5.0)
CHOLESTEROL: 155 mg/dL (ref 125–200)
HDL: 37 mg/dL — ABNORMAL LOW (ref >=40)
LDL Cholesterol: 92 mg/dL (ref <130)
Triglycerides: 128 mg/dL (ref <150)
VLDL: 26 mg/dL (ref <30)

## 2015-02-24 LAB — HEPATIC FUNCTION PANEL
ALBUMIN: 4.3 g/dL (ref 3.6–5.1)
ALT: 41 U/L (ref 9–46)
AST: 39 U/L — ABNORMAL HIGH (ref 10–35)
Alkaline Phosphatase: 66 U/L (ref 40–115)
BILIRUBIN DIRECT: 0.1 mg/dL (ref <=0.2)
Indirect Bilirubin: 0.4 mg/dL (ref 0.2–1.2)
Total Bilirubin: 0.5 mg/dL (ref 0.2–1.2)
Total Protein: 8.3 g/dL — ABNORMAL HIGH (ref 6.1–8.1)

## 2015-02-24 LAB — POCT GLYCOSYLATED HEMOGLOBIN (HGB A1C): Hemoglobin A1C: 5.2

## 2015-02-24 LAB — GLUCOSE, RANDOM: Glucose, Bld: 88 mg/dL (ref 65–99)

## 2015-02-24 MED ORDER — METOPROLOL TARTRATE 25 MG PO TABS: ORAL_TABLET | ORAL | Status: DC

## 2015-02-24 MED ORDER — ATORVASTATIN CALCIUM 40 MG PO TABS: ORAL_TABLET | ORAL | Status: DC

## 2015-02-24 MED ORDER — NIACIN ER (ANTIHYPERLIPIDEMIC) 1000 MG PO TBCR: EXTENDED_RELEASE_TABLET | ORAL | Status: DC

## 2015-02-24 MED ORDER — CLOPIDOGREL BISULFATE 75 MG PO TABS: ORAL_TABLET | ORAL | Status: DC

## 2015-02-24 NOTE — Patient Instructions (Signed)
Please schedule colonoscopy. Call cardiologist--routine follow-up is recommended. Unsure if long-term Plavix is still recommended for you, I'd like their opinion. You may also be due for routine stress tests or other evaluation. If you need [place] to help schedule this appointment for you, let us know.  Schedule routine dental appointment.  Cut back on the beer intake--no more than 2 at a time. Alcohol contributes to raising sugar and triglycerides, and affects the liver, so the less the better.  Please cut back on your egg intake to 2-3/week (or less). On last check your cholesterol was too high for someone who has had heart disease.  If not better now, we will need to increase your medication dose.  We will be in touch in a few days with these results.  Fat and Cholesterol Control Diet Fat and cholesterol levels in your blood and organs are influenced by your diet. High levels of fat and cholesterol may lead to diseases of the heart, small and large blood vessels, gallbladder, liver, and pancreas. CONTROLLING FAT AND CHOLESTEROL WITH DIET Although exercise and lifestyle factors are important, your diet is key. That is because certain foods are known to raise cholesterol and others to lower it. The goal is to balance foods for their effect on cholesterol and more importantly, to replace saturated and trans fat with other types of fat, such as monounsaturated fat, polyunsaturated fat, and omega-3 fatty acids. On average, a person should consume no more than 15 to 17 g of saturated fat daily. Saturated and trans fats are considered "bad" fats, and they will raise LDL cholesterol. Saturated fats are primarily found in animal products such as meats, butter, and cream. However, that does not mean you need to give up all your favorite foods. Today, there are good tasting, low-fat, low-cholesterol substitutes for most of the things you like to eat. Choose low-fat or nonfat alternatives. Choose round or loin  cuts of red meat. These types of cuts are lowest in fat and cholesterol. Chicken (without the skin), fish, veal, and ground [place] breast are great choices. Eliminate fatty meats, such as hot dogs and salami. Even shellfish have little or no saturated fat. Have a 3 oz (85 g) portion when you eat lean meat, poultry, or fish. Trans fats are also called "partially hydrogenated oils." They are oils that have been scientifically manipulated so that they are solid at room temperature resulting in a longer shelf life and improved taste and texture of foods in which they are added. Trans fats are found in stick margarine, some tub margarines, cookies, crackers, and baked goods.  When baking and cooking, oils are a great substitute for butter. The monounsaturated oils are especially beneficial since it is believed they lower LDL and raise HDL. The oils you should avoid entirely are saturated tropical oils, such as coconut and palm.  Remember to eat a lot from food groups that are naturally free of saturated and trans fat, including fish, fruit, vegetables, beans, grains (barley, rice, couscous, bulgur wheat), and pasta (without cream sauces).  IDENTIFYING FOODS THAT LOWER FAT AND CHOLESTEROL  Soluble fiber may lower your cholesterol. This type of fiber is found in fruits such as apples, vegetables such as broccoli, potatoes, and carrots, legumes such as beans, peas, and lentils, and grains such as barley. Foods fortified with plant sterols (phytosterol) may also lower cholesterol. You should eat at least 2 g per day of these foods for a cholesterol lowering effect.  Read package labels to identify  low-saturated fats, trans fat free, and low-fat foods at the supermarket. Select cheeses that have only 2 to 3 g saturated fat per ounce. Use a heart-healthy tub margarine that is free of trans fats or partially hydrogenated oil. When buying baked goods (cookies, crackers), avoid partially hydrogenated oils. Breads and  muffins should be made from whole grains (whole-wheat or whole oat flour, instead of "flour" or "enriched flour"). Buy non-creamy canned soups with reduced salt and no added fats.  FOOD PREPARATION TECHNIQUES  Never deep-fry. If you must fry, either stir-fry, which uses very little fat, or use non-stick cooking sprays. When possible, broil, bake, or roast meats, and steam vegetables. Instead of putting butter or margarine on vegetables, use lemon and herbs, applesauce, and cinnamon (for squash and sweet potatoes). Use nonfat yogurt, salsa, and low-fat dressings for salads.  LOW-SATURATED FAT / LOW-FAT FOOD SUBSTITUTES Meats / Saturated Fat (g)  Avoid: Steak, marbled (3 oz/85 g) / 11 g  Choose: Steak, lean (3 oz/85 g) / 4 g  Avoid: Hamburger (3 oz/85 g) / 7 g  Choose: Hamburger, lean (3 oz/85 g) / 5 g  Avoid: Ham (3 oz/85 g) / 6 g  Choose: Ham, lean cut (3 oz/85 g) / 2.4 g  Avoid: Chicken, with skin, dark meat (3 oz/85 g) / 4 g  Choose: Chicken, skin removed, dark meat (3 oz/85 g) / 2 g  Avoid: Chicken, with skin, light meat (3 oz/85 g) / 2.5 g  Choose: Chicken, skin removed, light meat (3 oz/85 g) / 1 g Dairy / Saturated Fat (g)  Avoid: Whole milk (1 cup) / 5 g  Choose: Low-fat milk, 2% (1 cup) / 3 g  Choose: Low-fat milk, 1% (1 cup) / 1.5 g  Choose: Skim milk (1 cup) / 0.3 g  Avoid: Hard cheese (1 oz/28 g) / 6 g  Choose: Skim milk cheese (1 oz/28 g) / 2 to 3 g  Avoid: Cottage cheese, 4% fat (1 cup) / 6.5 g  Choose: Low-fat cottage cheese, 1% fat (1 cup) / 1.5 g  Avoid: Ice cream (1 cup) / 9 g  Choose: Sherbet (1 cup) / 2.5 g  Choose: Nonfat frozen yogurt (1 cup) / 0.3 g  Choose: Frozen fruit bar / trace  Avoid: Whipped cream (1 tbs) / 3.5 g  Choose: Nondairy whipped topping (1 tbs) / 1 g Condiments / Saturated Fat (g)  Avoid: Mayonnaise (1 tbs) / 2 g  Choose: Low-fat mayonnaise (1 tbs) / 1 g  Avoid: Butter (1 tbs) / 7 g  Choose: Extra light margarine (1  tbs) / 1 g  Avoid: Coconut oil (1 tbs) / 11.8 g  Choose: Olive oil (1 tbs) / 1.8 g  Choose: Corn oil (1 tbs) / 1.7 g  Choose: Safflower oil (1 tbs) / 1.2 g  Choose: Sunflower oil (1 tbs) / 1.4 g  Choose: Soybean oil (1 tbs) / 2.4 g  Choose: Canola oil (1 tbs) / 1 g Document Released: 05/09/2005 Document Revised: 09/03/2012 Document Reviewed: 08/07/2013 ExitCare Patient Information 2015 [place], [place]. This information is not intended to replace advice given to you by your health care provider. Make sure you discuss any questions you have with your health care provider.

## 2015-02-24 NOTE — Progress Notes (Signed)
Chief Complaint  Patient presents with  . Hyperlipidemia    fasting med check. No concerns-states he "feels great and has been doing really well."   Hypertension follow-up: Blood pressures elsewhere are 125-130's/mid-80's. 130/83 yesterday morning. He didn't sleep at all last night (brother's girlfriend is on hospice, and he stayed with her last night).  Denies dizziness, headaches, chest pain, edema. Denies side effects of medications. He is tired and worried today, aware that she will die at any moment.  Hyperlipidemia follow-up: Patient is reportedly following a low-fat, low cholesterol diet. Compliant with medications and denies medication side effects. His diet hasn't changed--He uses 1% milk, has cereal every morning. He has 4-5 eggs/week, and some cheese. Red meat 2x/week. He admits that he didn't really make any significant changes in his diet after his last labs six months ago showed higher LDL (104). He is fasting for recheck today.  Impaired fasting glucose--noted 07/2013.  He had mildly elevated glucose (106) with normal A1c (5.1). He has lost some weight and improved his diet since then--he cut out candy, and cut back on potatoes/carbs (wife has DM and is now making him eat similar to her). Alcohol remains at 6 beers per week now, previously only had 2-4 (last year).  CAD--he reported being released from cardiologist care a while ago. No records available--other than from Dr. Lovena Le in 2012 (he states there was another cardiologist also). He hasn't had any recurrent chest pain, no exertional dyspnea, and no recurrent tachycardia or palpitations. He was advised to call cardiologist at his last 2 visits (?when needs additional stress testing, ongoing plavix?) but admits he hasn't done that yet.  He plans to call to schedule his colonoscopy (now that he met his deductible from recent eye surgery).  PMH, March [facility] SH reviewed/updated  Outpatient Encounter Prescriptions as of 02/24/2015   Medication Sig Note  . aspirin EC 81 MG tablet Take 81 mg by mouth daily.     Marland Kitchen atorvastatin (LIPITOR) 40 MG tablet TAKE 1 TABLET (40 MG TOTAL) BY MOUTH DAILY.   Marland Kitchen clopidogrel (PLAVIX) 75 MG tablet TAKE 1 TABLET (75 MG TOTAL) BY MOUTH DAILY.   Marland Kitchen COENZYME Q-10 PO Take 1 tablet by mouth daily.   . fish oil-omega-3 fatty acids 1000 MG capsule Take 4 g by mouth daily.  01/23/2013: 4840m daily  . metoprolol tartrate (LOPRESSOR) 25 MG tablet TAKE 1 TABLET (25 MG TOTAL) BY MOUTH 2 (TWO) TIMES DAILY.   .Marland KitchenMisc Natural Products (OSTEO BI-FLEX TRIPLE STRENGTH PO) Take 1 tablet by mouth daily.   . niacin (NIASPAN) 1000 MG CR tablet TAKE 1 TABLET (1,000 MG TOTAL) BY MOUTH AT BEDTIME.   . [DISCONTINUED] clopidogrel (PLAVIX) 75 MG tablet TAKE 1 TABLET (75 MG TOTAL) BY MOUTH DAILY.   . [DISCONTINUED] metoprolol tartrate (LOPRESSOR) 25 MG tablet TAKE 1 TABLET (25 MG TOTAL) BY MOUTH 2 (TWO) TIMES DAILY.   . nitroGLYCERIN (NITROSTAT) 0.4 MG SL tablet Place 1 tablet (0.4 mg total) under the tongue every 5 (five) minutes as needed for chest pain. (Patient not taking: Reported on 02/24/2015) 01/22/2014: Does not need; has non-expired bottle   No facility-administered encounter medications on file as of 02/24/2015.   No Known Allergies  ROS: no fever, chills, URI symptoms, headaches, dizziness, chest pain, palpitations, GI or GU complaints, edema, shortness of breath, depression, anxiety, joint pains, or other complaints.  See HPI.  Vision improved s/p cattaract surgery.  PHYSICAL EXAM:  BP 160/94 mmHg  Pulse 68  Ht  5' 10" (1.778 m)  Wt 224 lb (101.606 kg)  BMI 32.14 kg/m2 134/80 on repeat by MD Well developed, pleasant male in no distress HEENT: PERRL, EOMI, conjunctiva clear.  OP clear--teeth with decay, no mucosal abnormalities Neck: no lymphadenopathy, thyromegaly or carotid bruit Heart: regular rate and rhythm without murmur Lungs: clear bilaterally Abdomen: soft, nontender, no mass Extremities: no  edema, 2+ pulses Skin: no rashes or suspicious lesions.  Abrasion to left forearm, without evidence of infection Psych: normal mood, affect, hygiene and grooming Neuro: alert and oriented. Normal strength, gait, cranial nerves  Lab Results  Component Value Date   HGBA1C 5.2 02/24/2015   ASSESSMENT/PLAN:  Essential hypertension - higher today related to poor sleep; normal at home - Plan: metoprolol tartrate (LOPRESSOR) 25 MG tablet  Need for prophylactic vaccination and inoculation against influenza - Plan: Flu Vaccine QUAD 36+ mos PF IM (Fluarix & Fluzone Quad PF)  IFG (impaired fasting glucose) - encouraged, daily exercise, weight loss. diet reviewed. - Plan: HgB A1c, Glucose, random  Impaired fasting glucose  Mixed hyperlipidemia - reviewed goals, diet; adjust meds if needed after labs back - Plan: Lipid panel  Coronary artery disease involving native coronary artery of native heart without angina pectoris - asymptomatic; recommended f/u with cardiologist. Declines referral from [place], he will call to schedule. - Plan: metoprolol tartrate (LOPRESSOR) 25 MG tablet, clopidogrel (PLAVIX) 75 MG tablet  Elevated LFTs - Plan: Hepatic function panel   Lipids, LFT's, glucose Refill Niacin and Lipitor after labs back  Please schedule colonoscopy. Call cardiologist--routine follow-up is recommended. Unsure if long-term Plavix is still recommended for you, I'd like their opinion.  Schedule routine dental appointment.  Cut back on the beer intake--no more than 2 at a time. Alcohol contributes to raising sugar and triglycerides, and affects the liver, so the less the better.

## 2015-03-09 ENCOUNTER — Telehealth: Payer: Self-pay | Admitting: Family Medicine

## 2015-03-09 DIAGNOSIS — I2510 Atherosclerotic heart disease of native coronary artery without angina pectoris: Secondary | ICD-10-CM

## 2015-03-09 MED ORDER — NITROGLYCERIN 0.4 MG SL SUBL: 0.4000 mg | SUBLINGUAL_TABLET | SUBLINGUAL | Status: DC | PRN

## 2015-03-09 NOTE — Telephone Encounter (Signed)
Called and let pt know

## 2015-03-09 NOTE — Telephone Encounter (Signed)
Pt had Nitroglycerin pills in pocket when he washed clothes so wants to know if he can get a refill on these in case he need them. States he carries them around with him in case he need them and just forgot to take them out of hiss pocket before doing laundry

## 2015-03-09 NOTE — Telephone Encounter (Signed)
Done. Please advise pt rx was sent

## 2015-03-13 ENCOUNTER — Other Ambulatory Visit: Payer: Self-pay | Admitting: Family Medicine

## 2015-04-13 ENCOUNTER — Other Ambulatory Visit: Payer: Self-pay | Admitting: Family Medicine

## 2015-08-06 ENCOUNTER — Other Ambulatory Visit: Payer: Self-pay | Admitting: Family Medicine

## 2015-08-31 ENCOUNTER — Ambulatory Visit (INDEPENDENT_AMBULATORY_CARE_PROVIDER_SITE_OTHER): Payer: BC Managed Care – PPO | Admitting: Gastroenterology

## 2015-08-31 ENCOUNTER — Telehealth: Payer: Self-pay

## 2015-08-31 ENCOUNTER — Encounter: Payer: Self-pay | Admitting: Gastroenterology

## 2015-08-31 VITALS — BP 170/90 | HR 60 | Ht 71.0 in | Wt 225.4 lb

## 2015-08-31 DIAGNOSIS — I252 Old myocardial infarction: Secondary | ICD-10-CM | POA: Diagnosis not present

## 2015-08-31 DIAGNOSIS — Z1211 Encounter for screening for malignant neoplasm of colon: Secondary | ICD-10-CM | POA: Diagnosis not present

## 2015-08-31 DIAGNOSIS — I2510 Atherosclerotic heart disease of native coronary artery without angina pectoris: Secondary | ICD-10-CM | POA: Diagnosis not present

## 2015-08-31 MED ORDER — NA SULFATE-K SULFATE-MG SULF 17.5-3.13-1.6 GM/177ML PO SOLN: 1.0000 | Freq: Once | ORAL | Status: DC

## 2015-08-31 NOTE — Telephone Encounter (Signed)
   Message     Hi Timothy Estes,     [place] with Dr.Knapp re: Timothy Estes states that it is fine for him to be off Plavix for 5 days prior to 11/10/15 procedure. Let me know if you need anything else. Have a nice day.     Carolee Rota, CMA     ----- Message -----     From: Rita Ohara, MD     Sent: 08/31/2015  4:33 PM      To: [name]        Also--okay to reply to message from GI nurse that is fine to hold plavix.    But I want to make sure that pt gets set up with cardiology!        ----- Message -----     From: Ladene Artist, MD     Sent: 08/31/2015 11:49 AM      To: Rita Ohara, MD

## 2015-08-31 NOTE — Patient Instructions (Addendum)
You have been scheduled for a colonoscopy. Please follow written instructions given to you at your visit today.  Please pick up your prep supplies at the pharmacy within the next 1-3 days. If you use inhalers (even only as needed), please bring them with you on the day of your procedure. Your physician has requested that you go to www.startemmi.com and enter the access code given to you at your visit today. This web site gives a general overview about your procedure. However, you should still follow specific instructions given to you by our office regarding your preparation for the procedure.  Today your blood pressure was elevated. Please follow up with your Primary Care Provider for blood pressure management.  Normal BMI (Body Mass Index- based on height and weight) is between 19 and 25. Your BMI today is Body mass index is 31.45 kg/(m^2). Marland Kitchen Please consider follow up  regarding your BMI with your Primary Care Provider.  Thank you for choosing me and [facility].  Pricilla Riffle. Dagoberto Ligas., MD., Marval Regal

## 2015-08-31 NOTE — Telephone Encounter (Signed)
He was advised at his visit last year to follow up with his cardiologist--routine follow-up was recommended. I was unsure if long-term Plavix was still recommended for him, and wanted their opinion. GI is now asking if he can come off for procedure.  My instinct is yes, but the bigger question remains as to whether he needs to stay on it longterm or not. He has been asked to schedule with cardiologist for over a year now, please be sure he does so.

## 2015-08-31 NOTE — Telephone Encounter (Signed)
Patient notified to hold Plavix 5 days prior to his procedure per Dr. Tomi Bamberger and informed him to schedule a follow up visit with Cardiologist. Patient verbalized understanding.

## 2015-08-31 NOTE — Telephone Encounter (Signed)
  08/31/2015   RE: Timothy Estes DOB: 07-09-1950 MRN: [id number]   Dear Dr. Tomi Bamberger,    We have scheduled the above patient for an endoscopic procedure. Our records show that he is on anticoagulation therapy.   Please advise as to how long the patient may come off his therapy of Plavix prior to the procedure, which is scheduled for 11/10/15.  Please route your answer to Marlon Pel, [place].  Sincerely,    Marlon Pel, CMA

## 2015-08-31 NOTE — Progress Notes (Signed)
    History of Present Illness: This is a 65 year old male referred by Rita Ohara, MD for colorectal cancer screening. He is maintained on Plavix with a history of coronary artery disease and a non STEMI and afib in 02/2011. He has been maintained on Plavix since that time. No coronary artery or other stents. No recent cardiology follow-up. He has not previously had colonoscopy. He wishes to proceed with colonoscopy for colorectal cancer screening. Denies weight loss, abdominal pain, constipation, diarrhea, change in stool caliber, melena, hematochezia, nausea, vomiting, dysphagia, reflux symptoms, chest pain.  Review of Systems: Pertinent positive and negative review of systems were noted in the above HPI section. All other review of systems were otherwise negative.  Current Medications, Allergies, Past Medical History, Past Surgical History, Family History and Social History were reviewed in [employer] record.  Physical Exam: General: Well developed, well nourished, no acute distress Head: Normocephalic and atraumatic Eyes:  sclerae anicteric, EOMI Ears: Normal auditory acuity Mouth: No deformity or lesions Neck: Supple, no masses or thyromegaly Lungs: Clear throughout to auscultation Heart: Regular rate and rhythm; no murmurs, rubs or bruits Abdomen: Soft, non tender and non distended. No masses, hepatosplenomegaly or hernias noted. Normal Bowel sounds Rectal: deferred to colonoscopy Musculoskeletal: Symmetrical with no gross deformities  Skin: No lesions on visible extremities Pulses:  Normal pulses noted Extremities: No clubbing, cyanosis, edema or deformities noted Neurological: Alert oriented x 4, grossly nonfocal Cervical Nodes:  No significant cervical adenopathy Inguinal Nodes: No significant inguinal adenopathy Psychological:  Alert and cooperative. Normal mood and affect  Assessment and Recommendations:  1. CRC screening, average risk. Schedule  colonoscopy. The risks (including bleeding, perforation, infection, missed lesions, medication reactions and possible hospitalization or surgery if complications occur), benefits, and alternatives to colonoscopy with possible biopsy and possible polypectomy were discussed with the patient and they consent to proceed.   2. Hold Plavix 5 days before procedure - will instruct when and how to resume after procedure. Low but real risk of cardiovascular event such as heart attack, stroke, embolism, thrombosis or ischemia/infarct of other organs off Plavix explained and need to seek urgent help if this occurs. The patient consents to proceed. Will communicate by phone or EMR with patient's prescribing provider to confirm that holding Plavix is reasonable in this case.    cc: Rita Ohara, MD [address] [place], [place] [address]

## 2015-09-03 ENCOUNTER — Encounter: Payer: Self-pay | Admitting: Family Medicine

## 2015-09-03 ENCOUNTER — Ambulatory Visit (INDEPENDENT_AMBULATORY_CARE_PROVIDER_SITE_OTHER): Payer: BC Managed Care – PPO | Admitting: Family Medicine

## 2015-09-03 VITALS — BP 132/78 | HR 72 | Ht 70.0 in | Wt 223.4 lb

## 2015-09-03 DIAGNOSIS — R5383 Other fatigue: Secondary | ICD-10-CM | POA: Diagnosis not present

## 2015-09-03 DIAGNOSIS — Z125 Encounter for screening for malignant neoplasm of prostate: Secondary | ICD-10-CM | POA: Diagnosis not present

## 2015-09-03 DIAGNOSIS — R7989 Other specified abnormal findings of blood chemistry: Secondary | ICD-10-CM | POA: Diagnosis not present

## 2015-09-03 DIAGNOSIS — Z5181 Encounter for therapeutic drug level monitoring: Secondary | ICD-10-CM

## 2015-09-03 DIAGNOSIS — E782 Mixed hyperlipidemia: Secondary | ICD-10-CM | POA: Diagnosis not present

## 2015-09-03 DIAGNOSIS — I10 Essential (primary) hypertension: Secondary | ICD-10-CM

## 2015-09-03 DIAGNOSIS — I2510 Atherosclerotic heart disease of native coronary artery without angina pectoris: Secondary | ICD-10-CM

## 2015-09-03 DIAGNOSIS — R945 Abnormal results of liver function studies: Secondary | ICD-10-CM

## 2015-09-03 DIAGNOSIS — Z0000 Encounter for general adult medical examination without abnormal findings: Secondary | ICD-10-CM

## 2015-09-03 DIAGNOSIS — R7301 Impaired fasting glucose: Secondary | ICD-10-CM

## 2015-09-03 LAB — CBC WITH DIFFERENTIAL/PLATELET
Basophils Absolute: 87 cells/uL (ref 0–200)
Basophils Relative: 1 %
EOS PCT: 6 %
Eosinophils Absolute: 522 cells/uL — ABNORMAL HIGH (ref 15–500)
HCT: 42.1 % (ref 38.5–50.0)
Hemoglobin: 14.1 g/dL (ref 13.2–17.1)
LYMPHS ABS: 2610 {cells}/uL (ref 850–3900)
LYMPHS PCT: 30 %
MCH: 28.7 pg (ref 27.0–33.0)
MCHC: 33.5 g/dL (ref 32.0–36.0)
MCV: 85.6 fL (ref 80.0–100.0)
MONOS PCT: 10 %
MPV: 9.8 fL (ref 7.5–12.5)
Monocytes Absolute: 870 cells/uL (ref 200–950)
NEUTROS PCT: 53 %
Neutro Abs: 4611 cells/uL (ref 1500–7800)
PLATELETS: 231 10*3/uL (ref 140–400)
RBC: 4.92 MIL/uL (ref 4.20–5.80)
RDW: 14.1 % (ref 11.0–15.0)
WBC: 8.7 10*3/uL (ref 4.0–10.5)

## 2015-09-03 LAB — POCT URINALYSIS DIPSTICK
Bilirubin, UA: NEGATIVE
Glucose, UA: NEGATIVE
KETONES UA: NEGATIVE
Leukocytes, UA: NEGATIVE
Nitrite, UA: NEGATIVE
PH UA: 5.5
PROTEIN UA: NEGATIVE
RBC UA: NEGATIVE
Urobilinogen, UA: NEGATIVE

## 2015-09-03 LAB — COMPREHENSIVE METABOLIC PANEL
ALK PHOS: 71 U/L (ref 40–115)
ALT: 45 U/L (ref 9–46)
AST: 41 U/L — ABNORMAL HIGH (ref 10–35)
Albumin: 4.4 g/dL (ref 3.6–5.1)
BILIRUBIN TOTAL: 0.8 mg/dL (ref 0.2–1.2)
BUN: 15 mg/dL (ref 7–25)
CO2: 23 mmol/L (ref 20–31)
CREATININE: 1.05 mg/dL (ref 0.70–1.25)
Calcium: 9.9 mg/dL (ref 8.6–10.3)
Chloride: 104 mmol/L (ref 98–110)
Glucose, Bld: 98 mg/dL (ref 65–99)
POTASSIUM: 4.5 mmol/L (ref 3.5–5.3)
Sodium: 138 mmol/L (ref 135–146)
TOTAL PROTEIN: 8.4 g/dL — AB (ref 6.1–8.1)

## 2015-09-03 LAB — LIPID PANEL
CHOLESTEROL: 161 mg/dL (ref 125–200)
HDL: 40 mg/dL (ref >=40)
LDL CALC: 91 mg/dL (ref <130)
TRIGLYCERIDES: 150 mg/dL — AB (ref <150)
Total CHOL/HDL Ratio: 4 Ratio (ref <=5.0)
VLDL: 30 mg/dL (ref <30)

## 2015-09-03 LAB — TSH: TSH: 1.74 m[IU]/L (ref 0.40–4.50)

## 2015-09-03 LAB — POCT GLYCOSYLATED HEMOGLOBIN (HGB A1C): Hemoglobin A1C: 5.4

## 2015-09-03 MED ORDER — METOPROLOL TARTRATE 25 MG PO TABS: ORAL_TABLET | ORAL | Status: DC

## 2015-09-03 MED ORDER — ATORVASTATIN CALCIUM 40 MG PO TABS: ORAL_TABLET | ORAL | Status: DC

## 2015-09-03 MED ORDER — CLOPIDOGREL BISULFATE 75 MG PO TABS: ORAL_TABLET | ORAL | Status: DC

## 2015-09-03 MED ORDER — NIACIN ER (ANTIHYPERLIPIDEMIC) 1000 MG PO TBCR: EXTENDED_RELEASE_TABLET | ORAL | Status: DC

## 2015-09-03 NOTE — Patient Instructions (Addendum)
  HEALTH MAINTENANCE RECOMMENDATIONS:  It is recommended that you get at least 30 minutes of aerobic exercise at least 5 days/week (for weight loss, you may need as much as 60-90 minutes). This can be any activity that gets your heart rate up. This can be divided in 10-15 minute intervals if needed, but try and build up your endurance at least once a week.  Weight bearing exercise is also recommended twice weekly.  Eat a healthy diet with lots of vegetables, fruits and fiber.  "Colorful" foods have a lot of vitamins (ie green vegetables, tomatoes, red peppers, etc).  Limit sweet tea, regular sodas and alcoholic beverages, all of which has a lot of calories and sugar.  Up to 2 alcoholic drinks daily may be beneficial for men (unless trying to lose weight, watch sugars).  Drink a lot of water.  Sunscreen of at least SPF 30 should be used on all sun-exposed parts of the skin when outside between the hours of 10 am and 4 pm (not just when at beach or pool, but even with exercise, golf, tennis, and yard work!)  Use a sunscreen that says "broad spectrum" so it covers both UVA and UVB rays, and make sure to reapply every 1-2 hours.  Remember to change the batteries in your smoke detectors when changing your clock times in the spring and fall.  Use your seat belt every time you are in a car, and please drive safely and not be distracted with cell phones and texting while driving.   Schedule routine dental appointment.  We are scheduling you for cardiology visit (since we have been asking you for well over a year to do this).    It is very important that you lose some of your belly weight, as we discussed.   Schedule routine dental appointment.  We are scheduling you for cardiology visit (since we have been asking you for well over a year to do this).   --Verdene Lennert will give you a paper with the date/address.

## 2015-09-03 NOTE — Progress Notes (Signed)
Chief Complaint  Patient presents with  . Annual Exam    fasting annual exam. Did not do eye exam-just had one this week [facility] [place] with [facility].    Timothy Estes is a 65 y.o. male who presents for a complete physical.  He has the following concerns:  Hypertension follow-up: Blood pressures elsewhere are 125-132/78-80; 128/78 yesterday morning. Denies headaches, dizziness, chest pain, edema, dyspnea with exertion, side effects to medications.   Hyperlipidemia follow-up: Patient is reportedly following a low-fat, low cholesterol diet. Compliant with medications and denies medication side effects. His diet hasn't changed--He uses 1% milk, has cereal every morning. He has 4 eggs/week, cut back on cheese. Red meat 2x/week.  Occasional red meat (rarely).  Mostly lots of chicken and [place].  Impaired fasting glucose--noted 07/2013. He had mildly elevated glucose (106) with normal A1c (5.1). He continues to follow a lower carb diet than in the past, eating how his diabetic wife eats. Alcohol remains at 6 beers per week.  CAD--he reported being released from cardiologist care a while ago. No records available--other than from Dr. Lovena Le in 2012 (he states there was another cardiologist also). He hasn't had any recurrent chest pain, no exertional dyspnea, and no recurrent tachycardia or palpitations. He was advised to call cardiologist at his last 3 visits (?when needs additional stress testing, ongoing plavix?) but admits he hasn't done that yet. Unsure exactly why he has been maintained on Plavix.   Immunization History  Administered Date(s) Administered  . Influenza Split 03/15/2012  . Influenza,inj,Quad PF,36+ Mos 01/23/2013, 01/22/2014, 02/24/2015  . Tdap 07/10/2012  . Zoster 07/10/2012   Last colonoscopy: Never; saw GI this week and is scheduled. Last PSA: 07/2014 Dentist: 6 years ago.  Ophtho: recently Exercise: Walks up and down driveway 2-3 times [employer]--09-32 mins, 4-5  times/week.Carries his wife's 3# hand weights while walking. Cutting wood recently.  Past Medical History  Diagnosis Date  . Syncope     in setting of AFib with RVR  . Atrial fibrillation with RVR ([place])     converted to NSR with IV dilt 10/12 - no coumadin due to need for Plavix and ASA  . Coronary artery disease     LHC 03/17/11: Dx 30%, OM1 30%, OM2 30%, pRCA 30%, EF 40-45% with anterolat and apical HK  . Hypertension   . Hyperlipidemia   . NSTEMI (non-ST elevated myocardial infarction) (HCC)     FLET THAT NON-ST-ELEVATION MI IS POSSIBLY SECONDARY TO CORONARY EMBOLUS FROM HIS AFIB  . Hematochezia     LIKEY FROM HEMORRHOIDAL BLEEDING; PT HAS H/O  . Elevated LFTs   . Fatty liver     Past Surgical History  Procedure Laterality Date  . Cardiac catheterization  03/18/11  . Cardiac event monitor  04/01/11  . Transthoracic echocardiogram  03/19/11  . Cataract extraction w/phaco Left 09/18/2014    Procedure: CATARACT EXTRACTION PHACO AND INTRAOCULAR LENS PLACEMENT LEFT EYE;  Surgeon: Tonny Branch, MD;  Location: [facility];  Service: Ophthalmology;  Laterality: Left;  CDE 13.32    Social History   Social History  . Marital Status: Married    Spouse Name: N/A  . Number of Children: 0  . Years of Education: N/A   Occupational History  . 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 Smoking status: Former Smoker -- 0.50 packs/day for 3 years    Types: Cigarettes    Quit date: 11/24/1974  . Smokeless tobacco: Never Used  .  Alcohol Use: 0.0 oz/week    0 Standard drinks or equivalent per week     Comment: 6 beers per week   . Drug Use: No  . Sexual Activity:    Partners: Female   Other Topics Concern  . Not on file   Social History Narrative   Lives with wife, 1 dog.  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 Heart disease Neg Hx   . Cancer Neg Hx   . Diabetes Neg Hx   .  Hyperlipidemia Brother   . Hypertension Brother   . Arthritis Mother     Outpatient Encounter Prescriptions as of 09/03/2015  Medication Sig Note  . aspirin EC 81 MG tablet Take 162 mg by mouth daily.    Marland Kitchen atorvastatin (LIPITOR) 40 MG tablet TAKE 1 TABLET (40 MG TOTAL) BY MOUTH DAILY.   Marland Kitchen clopidogrel (PLAVIX) 75 MG tablet TAKE 1 TABLET (75 MG TOTAL) BY MOUTH DAILY.   Marland Kitchen COENZYME Q-10 PO Take 1 tablet by mouth daily.   . fish oil-omega-3 fatty acids 1000 MG capsule Take 4 g by mouth daily.  01/23/2013: 4857m daily  . metoprolol tartrate (LOPRESSOR) 25 MG tablet TAKE 1 TABLET (25 MG TOTAL) BY MOUTH 2 (TWO) TIMES DAILY.   .Marland KitchenMisc Natural Products (OSTEO BI-FLEX TRIPLE STRENGTH PO) Take 1 tablet by mouth daily.   . niacin (NIASPAN) 1000 MG CR tablet TAKE 1 TABLET (1,000 MG TOTAL) BY MOUTH AT BEDTIME.   . nitroGLYCERIN (NITROSTAT) 0.4 MG SL tablet Place 1 tablet (0.4 mg total) under the tongue every 5 (five) minutes as needed for chest pain. (Patient not taking: Reported on 09/03/2015)   . [DISCONTINUED] Na Sulfate-K Sulfate-Mg Sulf SOLN Take 1 kit by mouth once.    No facility-administered encounter medications on file as of 09/03/2015.    No Known Allergies   ROS: The patient denies anorexia, fever, headaches, weight changes, decreased hearing, ear pain, hoarseness, chest pain, palpitations, dizziness, syncope, dyspnea on exertion, cough, swelling, nausea, vomiting, diarrhea, constipation, abdominal pain, melena, hematochezia, indigestion/heartburn, hematuria, incontinence, erectile dysfunction, nocturia, weakened urine stream, dysuria, genital lesions, numbness, tingling, weakness, tremor, suspicious skin lesions, depression, anxiety, abnormal bleeding/bruising, or enlarged lymph nodes. Denies memory issues. +snores (per wife). Feels well-rested in the mornings. Denies daytime somnolence. Wife denies apnea. (reviewed--denies any change) Occasional runny nose related to allergies. Occasional left  knee pain (not recently)    PHYSICAL EXAM:  BP 140/86 mmHg  Pulse 72  Ht '5\' 10"'  (1.778 m)  Wt 223 lb 6.4 oz (101.334 kg)  BMI 32.05 kg/m2 132/78 on repeat by MD  General Appearance:  Alert, cooperative, no distress, appears older than stated age   Head:  Normocephalic, without obvious abnormality, atraumatic   Eyes:  PERRL, conjunctiva/corneas clear, EOM's intact, fundi  benign   Ears:  Normal TM's and external ear canals   Nose:  Nares normal, mucosa is mildly edematous, pale, with clear mucus; no sinus tenderness   Throat:  Lips, mucosa, and tongue normal; significant tooth decay noted (diffuse, but especially upper left front teeth)   Neck:  Supple, no lymphadenopathy; thyroid: no enlargement/tenderness/nodules; no carotid  bruit or JVD   Back:  Spine nontender, no curvature, ROM normal, no CVA tenderness   Lungs:  Clear to auscultation bilaterally without wheezes, rales or ronchi; respirations unlabored.   Chest Wall:  No tenderness or deformity  Heart:  Regular rate and rhythm, S1 and S2 normal, no murmur, rub  or gallop   Breast Exam:  No chest wall tenderness, masses or gynecomastia   Abdomen:  Soft, non-tender, nondistended, normoactive bowel sounds,  no masses, no hepatosplenomegaly   Genitalia:  Normal male external genitalia without lesions. Testicles without masses. No inguinal hernias.   Rectal:  Normal sphincter tone, no masses or tenderness; heme negative stool. Prostate smooth, no nodules, not enlarged. external hemorrhoid noted  Extremities:  No clubbing, cyanosis or edema.  Pulses:  2+ and symmetric all extremities   Skin:  Skin color, texture, turgor normal, no rashes or lesions. Dry patch of skin along right shin. No erythema  Lymph nodes:  Cervical, supraclavicular, and axillary nodes normal   Neurologic:  CNII-XII intact, normal strength, sensation and gait; reflexes 2+ and  symmetric throughout    Psych: Normal mood, affect, hygiene and grooming              Lab Results  Component Value Date   HGBA1C 5.4 09/03/2015     ASSESSMENT/PLAN:  Annual physical exam - Plan: POCT Urinalysis Dipstick, Lipid panel, Comprehensive metabolic panel, CBC with Differential/Platelet, VITAMIN D 25 Hydroxy (Vit-D Deficiency, Fractures), TSH, PSA  IFG (impaired fasting glucose) - normal A1c; encouraged daily exercise, weight loss, healthy diet - Plan: HgB A1c  Impaired fasting glucose  Mixed hyperlipidemia - Plan: atorvastatin (LIPITOR) 40 MG tablet, niacin (NIASPAN) 1000 MG CR tablet  Coronary artery disease involving native coronary artery of native heart without angina pectoris - asymptomatic. appt scheduled with cardiology--long past due for f/u. Unclear why maintained on Plavix - Plan: metoprolol tartrate (LOPRESSOR) 25 MG tablet, clopidogrel (PLAVIX) 75 MG tablet  Essential hypertension - well controlled (some white coat HTN component at first, improved on recheck) - Plan: metoprolol tartrate (LOPRESSOR) 25 MG tablet  Elevated LFTs - encouraged weight loss, cutting back on alcohol intake  Screening for prostate cancer - Plan: PSA  Medication monitoring encounter - Plan: Lipid panel, Comprehensive metabolic panel, CBC with Differential/Platelet  Other fatigue - Plan: Comprehensive metabolic panel, CBC with Differential/Platelet, VITAMIN D 25 Hydroxy (Vit-D Deficiency, Fractures), TSH   Discussed PSA screening (risks/benefits), recommended at least 30 minutes of aerobic activity at least 5 days/week; proper sunscreen use reviewed; healthy diet and alcohol recommendations (less than or equal to 2 drinks/day) reviewed; regular seatbelt use; changing batteries in smoke detectors. Self-testicular exams. Immunization recommendations discussed--UTD. Continue yearly flu shots. Prevnar at age 75. Colonoscopy recommendations  reviewed--scheduled.  Risks of obesity reviewed.  Counseled re: diet, exercise.   Schedule routine dental appointment.  We are scheduling you for cardiology visit (since we have been asking you for well over a year to do this).

## 2015-09-04 ENCOUNTER — Encounter: Payer: Self-pay | Admitting: Family Medicine

## 2015-09-04 LAB — PSA: PSA: 0.54 ng/mL (ref <=4.00)

## 2015-09-04 LAB — VITAMIN D 25 HYDROXY (VIT D DEFICIENCY, FRACTURES): VIT D 25 HYDROXY: 28 ng/mL — AB (ref 30–100)

## 2015-09-11 ENCOUNTER — Ambulatory Visit (INDEPENDENT_AMBULATORY_CARE_PROVIDER_SITE_OTHER): Payer: BC Managed Care – PPO | Admitting: Internal Medicine

## 2015-09-11 ENCOUNTER — Encounter: Payer: Self-pay | Admitting: Internal Medicine

## 2015-09-11 VITALS — BP 158/90 | HR 49 | Ht 70.0 in | Wt 226.4 lb

## 2015-09-11 DIAGNOSIS — I480 Paroxysmal atrial fibrillation: Secondary | ICD-10-CM | POA: Diagnosis not present

## 2015-09-11 NOTE — Progress Notes (Signed)
HPI Timothy Estes returns today for follow-up. We have not seen the patient in 5 years. He is a very pleasant 65 year old man with paroxysmal atrial fibrillation and coronary artery disease, status post non-ST elevation MI, with preserved left ventricular systolic function. In the interim, he has no anginal symptoms, and he has had minimal if any palpitations. He has not had syncope. He denies peripheral edema. He is pending colonoscopy. He remains active, working on a farm. No Known Allergies   Current Outpatient Prescriptions  Medication Sig Dispense Refill  . aspirin EC 81 MG tablet Take 162 mg by mouth daily.     Marland Kitchen atorvastatin (LIPITOR) 40 MG tablet TAKE 1 TABLET (40 MG TOTAL) BY MOUTH DAILY. 30 tablet 5  . clopidogrel (PLAVIX) 75 MG tablet TAKE 1 TABLET (75 MG TOTAL) BY MOUTH DAILY. 30 tablet 0  . COENZYME Q-10 PO Take 1 tablet by mouth daily.    . fish oil-omega-3 fatty acids 1000 MG capsule Take 4 g by mouth daily.     . metoprolol tartrate (LOPRESSOR) 25 MG tablet TAKE 1 TABLET (25 MG TOTAL) BY MOUTH 2 (TWO) TIMES DAILY. 60 tablet 5  . Misc Natural Products (OSTEO BI-FLEX TRIPLE STRENGTH PO) Take 1 tablet by mouth daily.    . niacin (NIASPAN) 1000 MG CR tablet TAKE 1 TABLET (1,000 MG TOTAL) BY MOUTH AT BEDTIME. 30 tablet 5  . nitroGLYCERIN (NITROSTAT) 0.4 MG SL tablet Place 1 tablet (0.4 mg total) under the tongue every 5 (five) minutes as needed for chest pain. 28 tablet 1   No current facility-administered medications for this visit.     Past Medical History  Diagnosis Date  . Syncope     in setting of AFib with RVR  . Atrial fibrillation with RVR ([place])     converted to NSR with IV dilt 10/12 - no coumadin due to need for Plavix and ASA  . Coronary artery disease     LHC 03/17/11: Dx 30%, OM1 30%, OM2 30%, pRCA 30%, EF 40-45% with anterolat and apical HK  . Hypertension   . Hyperlipidemia   . NSTEMI (non-ST elevated myocardial infarction) (HCC)     FLET THAT  NON-ST-ELEVATION MI IS POSSIBLY SECONDARY TO CORONARY EMBOLUS FROM HIS AFIB  . Hematochezia     LIKEY FROM HEMORRHOIDAL BLEEDING; PT HAS H/O  . Elevated LFTs   . Fatty liver     ROS:   All systems reviewed and negative except as noted in the HPI.   Past Surgical History  Procedure Laterality Date  . Cardiac catheterization  03/18/11  . Cardiac event monitor  04/01/11  . Transthoracic echocardiogram  03/19/11  . Cataract extraction w/phaco Left 09/18/2014    Procedure: CATARACT EXTRACTION PHACO AND INTRAOCULAR LENS PLACEMENT LEFT EYE;  Surgeon: Tonny Branch, MD;  Location: [facility];  Service: Ophthalmology;  Laterality: Left;  CDE 13.32     Family History  Problem Relation Age of Onset  . Heart disease Neg Hx   . Cancer Neg Hx   . Diabetes Neg Hx   . Hyperlipidemia Brother   . Hypertension Brother   . Arthritis Mother      Social History   Social History  . Marital Status: Married    Spouse Name: N/A  . Number of Children: 0  . Years of Education: N/A   Occupational History  . 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 Smoking status: Former Smoker --  0.50 packs/day for 3 years    Types: Cigarettes    Quit date: 11/24/1974  . Smokeless tobacco: Never Used  . Alcohol Use: 0.0 oz/week    0 Standard drinks or equivalent per week     Comment: 6 beers per week   . Drug Use: No  . Sexual Activity:    Partners: Female   Other Topics Concern  . Not on file   Social History Narrative   Lives with wife, 1 dog.  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 HEART:  Regular rate rhythm, no murmurs, no rubs, no clicks Abd:  soft, positive bowel  sounds, no organomegally, no rebound, no guarding Ext:  2 plus pulses, no edema, no cyanosis, no clubbing Skin:  No rashes no nodules Neuro:  CN II through XII intact, motor grossly intact  EKG - nsr  Assess/Plan: 1. PAF - he is maintaining NSR and appears to be asymptomatic. I plan to start a Noac when he turns 62. 2. NSTEMI - he has had no additional chest pain. He will continue ASA and Plavix for now. He will hold his plavix before his colonoscopy. 3. HTN - he has white coat HTN. He notes that his blood pressure is normal when he is at home and not at the doctors office.  Mikle Bosworth.D.

## 2015-09-11 NOTE — Patient Instructions (Signed)
Medication Instructions:  Your physician recommends that you continue on your current medications as directed. Please refer to the Current Medication list given to you today.   Labwork: None ordered   Testing/Procedures: None ordered   Follow-Up: Your physician wants you to follow-up in: 11 months with Dr Knox Saliva will receive a reminder letter in the mail two months in advance. If you don't receive a letter, please call our office to schedule the follow-up appointment.   Any Other Special Instructions Will Be Listed Below (If Applicable).  Okay to hold Plavix 5 days prior to colonoscopy     If you need a refill on your cardiac medications before your next appointment, please call your pharmacy.

## 2015-10-02 ENCOUNTER — Other Ambulatory Visit: Payer: Self-pay | Admitting: Family Medicine

## 2015-10-02 NOTE — Telephone Encounter (Signed)
done

## 2015-10-02 NOTE — Telephone Encounter (Signed)
Is this ok to refill?  

## 2015-10-22 DIAGNOSIS — K648 Other hemorrhoids: Secondary | ICD-10-CM

## 2015-10-22 DIAGNOSIS — K5790 Diverticulosis of intestine, part unspecified, without perforation or abscess without bleeding: Secondary | ICD-10-CM

## 2015-10-22 DIAGNOSIS — K635 Polyp of colon: Secondary | ICD-10-CM

## 2015-10-22 HISTORY — DX: Polyp of colon: K63.5

## 2015-10-22 HISTORY — DX: Other hemorrhoids: K64.8

## 2015-10-22 HISTORY — DX: Diverticulosis of intestine, part unspecified, without perforation or abscess without bleeding: K57.90

## 2015-10-30 ENCOUNTER — Encounter: Payer: Self-pay | Admitting: Gastroenterology

## 2015-11-10 ENCOUNTER — Ambulatory Visit (AMBULATORY_SURGERY_CENTER): Payer: BC Managed Care – PPO | Admitting: Gastroenterology

## 2015-11-10 ENCOUNTER — Encounter: Payer: Self-pay | Admitting: Gastroenterology

## 2015-11-10 VITALS — BP 137/88 | HR 61 | Temp 96.2°F | Resp 20 | Ht 71.0 in | Wt 225.0 lb

## 2015-11-10 DIAGNOSIS — Z1211 Encounter for screening for malignant neoplasm of colon: Secondary | ICD-10-CM | POA: Diagnosis present

## 2015-11-10 DIAGNOSIS — D124 Benign neoplasm of descending colon: Secondary | ICD-10-CM

## 2015-11-10 DIAGNOSIS — D125 Benign neoplasm of sigmoid colon: Secondary | ICD-10-CM | POA: Diagnosis not present

## 2015-11-10 MED ORDER — SODIUM CHLORIDE 0.9 % IV SOLN: 500.0000 mL | INTRAVENOUS | Status: DC

## 2015-11-10 NOTE — Progress Notes (Signed)
Called to room to assist during endoscopic procedure.  Patient ID and intended procedure confirmed with present staff. Received instructions for my participation in the procedure from the performing physician.  

## 2015-11-10 NOTE — Op Note (Addendum)
[facility] Patient Name: Timothy Estes Procedure Date: 11/10/2015 7:41 AM MRN: [id number] Endoscopist: Ladene Artist , MD Age: 65 Referring MD:  Date of Birth: 1950-10-21 Gender: Male Account #: [id number] Procedure:                Colonoscopy Indications:              Screening for colorectal malignant neoplasm Medicines:                Monitored Anesthesia Care Procedure:                Pre-Anesthesia Assessment:                           - Prior to the procedure, a History and Physical                            was performed, and patient medications and                            allergies were reviewed. The patient's tolerance of                            previous anesthesia was also reviewed. The risks                            and benefits of the procedure and the sedation                            options and risks were discussed with the patient.                            All questions were answered, and informed consent                            was obtained. Prior Anticoagulants: The patient has                            taken Plavix (clopidogrel), last dose was 5 days                            prior to procedure. ASA Grade Assessment: III - A                            patient with severe systemic disease. After                            reviewing the risks and benefits, the patient was                            deemed in satisfactory condition to undergo the                            procedure.  After obtaining informed consent, the colonoscope                            was passed under direct vision. Throughout the                            procedure, the patient's blood pressure, pulse, and                            oxygen saturations were monitored continuously. The                            Model PCF-[id number]) scope was introduced                            through the anus and advanced to the the cecum,                             identified by appendiceal orifice and ileocecal                            valve. The colonoscopy was performed without                            difficulty. The patient tolerated the procedure                            well. The quality of the bowel preparation was                            good. The ileocecal valve, appendiceal orifice, and                            rectum were photographed. Scope In: 8:10:59 AM Scope Out: 8:29:27 AM Scope Withdrawal Time: 0 hours 14 minutes 54 seconds  Total Procedure Duration: 0 hours 18 minutes 28 seconds  Findings:                 The digital rectal exam was normal.                           A 8 mm polyp was found in the descending colon. The                            polyp was pedunculated. The polyp was removed with                            a cold snare. Resection and retrieval were complete.                           A 4 mm polyp was found in the descending colon. The                            polyp was sessile.  The polyp was removed with a                            cold biopsy forceps. Resection and retrieval were                            complete.                           A 5 mm polyp was found in the sigmoid colon. The                            polyp was sessile. The polyp was removed with a                            cold snare. Resection and retrieval were complete.                           A few small-mouthed diverticula were found in the                            sigmoid colon and descending colon. There was no                            evidence of diverticular bleeding.                           Internal hemorrhoids were found during                            retroflexion. The hemorrhoids were small and Grade                            I (internal hemorrhoids that do not prolapse).                           The exam was otherwise normal throughout the                            examined  colon. Complications:            No immediate complications. Estimated Blood Loss:     Estimated blood loss: none. Impression:               - One 8 mm polyp in the descending colon, removed                            with a cold snare. Resected and retrieved.                           - One 4 mm polyp in the descending colon, removed                            with a cold biopsy forceps. Resected and retrieved.                           -  One 5 mm polyp in the sigmoid colon, removed with                            a cold snare. Resected and retrieved.                           - Mild diverticulosis in the sigmoid colon and in                            the descending colon.                           - Internal hemorrhoids. Recommendation:           - Patient has a contact number available for                            emergencies. The signs and symptoms of potential                            delayed complications were discussed with the                            patient. Return to normal activities tomorrow.                            Written discharge instructions were provided to the                            patient.                           - Resume previous diet.                           - Continue present medications.                           - Resume Plavix (clopidogrel) at prior dose                            tomorrow. Refer to managing physician for further                            adjustment of therapy.                           - Await pathology results.                           - Repeat colonoscopy in 5 years for surveillance if                            polyp(s) precancerous, otherwise 10 years for  screening. Ladene Artist, MD 11/10/2015 8:38:34 AM This report has been signed electronically.

## 2015-11-10 NOTE — Progress Notes (Signed)
To recovery, report to Myers, RN, VSS. 

## 2015-11-10 NOTE — Patient Instructions (Signed)
YOU HAD AN ENDOSCOPIC PROCEDURE TODAY AT [facility]:   Refer to the procedure report that was given to you for any specific questions about what was found during the examination.  If the procedure report does not answer your questions, please call your gastroenterologist to clarify.  If you requested that your care partner not be given the details of your procedure findings, then the procedure report has been included in a sealed envelope for you to review at your convenience later.  YOU SHOULD EXPECT: Some feelings of bloating in the abdomen. Passage of more gas than usual.  Walking can help get rid of the air that was put into your GI tract during the procedure and reduce the bloating. If you had a lower endoscopy (such as a colonoscopy or flexible sigmoidoscopy) you may notice spotting of blood in your stool or on the toilet paper. If you underwent a bowel prep for your procedure, you may not have a normal bowel movement for a few days.  Please Note:  You might notice some irritation and congestion in your nose or some drainage.  This is from the oxygen used during your procedure.  There is no need for concern and it should clear up in a day or so.  SYMPTOMS TO REPORT IMMEDIATELY:   Following lower endoscopy (colonoscopy or flexible sigmoidoscopy):  Excessive amounts of blood in the stool  Significant tenderness or worsening of abdominal pains  Swelling of the abdomen that is new, acute  Fever of 100F or higher  For urgent or emergent issues, a gastroenterologist can be reached at any hour by calling [phone].   DIET: Your first meal following the procedure should be a small meal and then it is ok to progress to your normal diet. Heavy or fried foods are harder to digest and may make you feel nauseous or bloated.  Likewise, meals heavy in dairy and vegetables can increase bloating.  Drink plenty of fluids but you should avoid alcoholic beverages for 24  hours.  ACTIVITY:  You should plan to take it easy for the rest of today and you should NOT DRIVE or use heavy machinery until tomorrow (because of the sedation medicines used during the test).    FOLLOW UP: Our staff will call the number listed on your records the next business day following your procedure to check on you and address any questions or concerns that you may have regarding the information given to you following your procedure. If we do not reach you, we will leave a message.  However, if you are feeling well and you are not experiencing any problems, there is no need to return our call.  We will assume that you have returned to your regular daily activities without incident.  If any biopsies were taken you will be contacted by phone or by letter within the next 1-3 weeks.  Please call us at [phone] if you have not heard about the biopsies in 3 weeks.    SIGNATURES/CONFIDENTIALITY: You and/or your care partner have signed paperwork which will be entered into your electronic medical record.  These signatures attest to the fact that that the information above on your After Visit Summary has been reviewed and is understood.  Full responsibility of the confidentiality of this discharge information lies with you and/or your care-partner.  Please read polyp, diverticulosis, high fiber diet, and hemorrhoid handouts provided. Resume Plavix tomorrow. Next colonoscopy determined by pathology results.

## 2015-11-11 ENCOUNTER — Telehealth: Payer: Self-pay | Admitting: *Deleted

## 2015-11-11 NOTE — Telephone Encounter (Signed)
  Follow up Call-  Call back number 11/10/2015  Post procedure Call Back phone  # [phone]  Permission to leave phone message Yes     Patient questions:  Do you have a fever, pain , or abdominal swelling? No. Pain Score  0 *  Have you tolerated food without any problems? Yes.    Have you been able to return to your normal activities? Yes.    Do you have any questions about your discharge instructions: Diet   No. Medications  No. Follow up visit  No.  Do you have questions or concerns about your Care? No.  Actions: * If pain score is 4 or above: No action needed, pain <4.

## 2015-11-13 ENCOUNTER — Encounter: Payer: Self-pay | Admitting: Gastroenterology

## 2015-11-16 ENCOUNTER — Encounter: Payer: Self-pay | Admitting: Family Medicine

## 2015-11-30 ENCOUNTER — Other Ambulatory Visit: Payer: Self-pay | Admitting: *Deleted

## 2015-11-30 ENCOUNTER — Telehealth: Payer: Self-pay

## 2015-11-30 DIAGNOSIS — E782 Mixed hyperlipidemia: Secondary | ICD-10-CM

## 2015-11-30 MED ORDER — ATORVASTATIN CALCIUM 40 MG PO TABS: ORAL_TABLET | ORAL | Status: DC

## 2015-11-30 NOTE — Telephone Encounter (Signed)
Request change of atorvastatin to [facility] day supply to [facility] [place]. Timothy Estes

## 2015-11-30 NOTE — Telephone Encounter (Signed)
Done x 90 days. 

## 2016-02-19 ENCOUNTER — Other Ambulatory Visit: Payer: Self-pay

## 2016-02-19 ENCOUNTER — Telehealth: Payer: Self-pay

## 2016-02-19 DIAGNOSIS — E782 Mixed hyperlipidemia: Secondary | ICD-10-CM

## 2016-02-19 MED ORDER — NIACIN ER (ANTIHYPERLIPIDEMIC) 1000 MG PO TBCR: EXTENDED_RELEASE_TABLET | ORAL | 0 refills | Status: DC

## 2016-02-19 NOTE — Telephone Encounter (Signed)
Fax request for [facility] day [facility]

## 2016-03-09 NOTE — Progress Notes (Signed)
Chief Complaint  Patient presents with  . Hyperlipidemia    fasting med check.    Hypertension follow-up: Blood pressures elsewhere are 130/low 80's at home; 132/82 yesterday, at home, [facility]. Denies headaches, dizziness, chest pain, edema, dyspnea with exertion, side effects to medications.   Hyperlipidemia follow-up: Patient is reportedly following a low-fat, low cholesterol diet. Compliant with medications and denies medication side effects. His diet hasn't changed--He uses 1% milk, has cereal every morning. He has 2-3 eggs/week, cut back on cheese. Red meat 2x/week.  Mostly lots of chicken and [place].  Impaired fasting glucose--noted 07/2013. He had mildly elevated glucose (106) with normal A1c (5.1). He continues to follow a lower carb diet than in the past, eating how his diabetic wife eats. Lately he hasn't been drinking as much beer, just a few/week (down from 6).  Has been busy doing arts and crafts for the holidays.  Vitamin D deficiency--level was low at 28 at his physical 6 months ago. He has been taking 1000 IU daily since then.  Paroxysmal atrial fibrillation and coronary artery disease, status post non-ST elevation MI, with preserved left ventricular systolic function:  He saw Dr. Lovena Le in April.  Was told to continue aspirin and plavix.  Maintaining sinus rhythm. Scheduled to see him again in February or March.  He had colonoscopy in June, which was benign.  Hasn't been to the dentist yet, but plans to.  PMH, PSH, SH reviewed  Outpatient Encounter Prescriptions as of 03/10/2016  Medication Sig Note  . aspirin EC 81 MG tablet Take 162 mg by mouth daily.    Marland Kitchen atorvastatin (LIPITOR) 40 MG tablet TAKE 1 TABLET (40 MG TOTAL) BY MOUTH DAILY.   Marland Kitchen clopidogrel (PLAVIX) 75 MG tablet TAKE 1 TABLET (75 MG TOTAL) BY MOUTH DAILY.   Marland Kitchen COENZYME Q-10 PO Take 1 tablet by mouth daily.   . fish oil-omega-3 fatty acids 1000 MG capsule Take 4 g by mouth daily.    . metoprolol  tartrate (LOPRESSOR) 25 MG tablet TAKE 1 TABLET (25 MG TOTAL) BY MOUTH 2 (TWO) TIMES DAILY.   Marland Kitchen Misc Natural Products (OSTEO BI-FLEX TRIPLE STRENGTH PO) Take 1 tablet by mouth daily.   . niacin (NIASPAN) 1000 MG CR tablet TAKE 1 TABLET (1,000 MG TOTAL) BY MOUTH AT BEDTIME.   . nitroGLYCERIN (NITROSTAT) 0.4 MG SL tablet Place 1 tablet (0.4 mg total) under the tongue every 5 (five) minutes as needed for chest pain. (Patient not taking: Reported on 03/10/2016) 11/10/2015: Never has had to use   No facility-administered encounter medications on file as of 03/10/2016.    No Known Allergies  ROS: Denies fever, chills, URI symptoms, cough, shortness of breath, palpitations, chest pain, nausea, vomiting, diarrhea, constipation, urinary complaints, bleeding, bruises, rashes, depression, anxiety, insomnia or other complaints.  PHYSICAL EXAM:  BP (!) 170/90 (BP Location: Left Arm, Patient Position: Sitting, Cuff Size: Normal)   Pulse 72   Ht 5\' 10"  (1.778 m)   Wt 224 lb (101.6 kg)   BMI 32.14 kg/m   140/84 on repeat by MD  Well developed, pleasant male in no distress HEENT: PERRL, EOMI, conjunctiva clear.  OP clear--teeth with decay, no mucosal abnormalities Neck: no lymphadenopathy, thyromegaly or carotid bruit Heart: regular rate and rhythm without murmur Lungs: clear bilaterally Abdomen: soft, nontender, no mass Extremities: no edema, 2+ pulses Skin: no rashes or suspicious lesions.   Psych: normal mood, affect, hygiene and grooming Neuro: alert and oriented. Normal strength, gait, cranial nerves  Lab Results  Component Value Date   HGBA1C 5.2 03/10/2016   ASSESSMENT/PLAN:  Mixed hyperlipidemia - Plan: Hepatic function panel, Lipid panel  Need for prophylactic vaccination and inoculation against influenza - Plan: Flu Vaccine QUAD 36+ mos PF IM (Fluarix & Fluzone Quad PF)  IFG (impaired fasting glucose) - normal A1c. Encouraged weight loss, regular aerobic exercise.  Check fasting  glucose - Plan: HgB A1c, Glucose, random  Medication monitoring encounter - Plan: Hepatic function panel, Lipid panel  Vitamin D deficiency - compliant with daily supplement. - Plan: VITAMIN D 25 Hydroxy (Vit-D Deficiency, Fractures)  Coronary artery disease involving native coronary artery of native heart without angina pectoris - stable, asymptomatic.  Now back under the care of cardiologist - Plan: metoprolol tartrate (LOPRESSOR) 25 MG tablet  Essential hypertension - well controlled (some white coat HTN component at first, improved on recheck) - Plan: metoprolol tartrate (LOPRESSOR) 25 MG tablet   Glu, LFT's, lipid, Vit D  F/u 6 months for CPE (CPE)--will 66, type of visit will depend on his insurance (Welcome to Medicare vs CPE/AWV/med check   ADDENDUM: Lab Results  Component Value Date   CHOL 175 03/10/2016   HDL 47 03/10/2016   LDLCALC 98 03/10/2016   TRIG 148 03/10/2016   CHOLHDL 3.7 03/10/2016   Goal LDL should be <70 with his CAD. Increase lipitor to 80mg  and recheck in 2 mos.

## 2016-03-10 ENCOUNTER — Encounter: Payer: Self-pay | Admitting: Family Medicine

## 2016-03-10 ENCOUNTER — Ambulatory Visit (INDEPENDENT_AMBULATORY_CARE_PROVIDER_SITE_OTHER): Payer: BC Managed Care – PPO | Admitting: Family Medicine

## 2016-03-10 VITALS — BP 140/84 | HR 72 | Ht 70.0 in | Wt 224.0 lb

## 2016-03-10 DIAGNOSIS — E559 Vitamin D deficiency, unspecified: Secondary | ICD-10-CM

## 2016-03-10 DIAGNOSIS — Z5181 Encounter for therapeutic drug level monitoring: Secondary | ICD-10-CM

## 2016-03-10 DIAGNOSIS — E782 Mixed hyperlipidemia: Secondary | ICD-10-CM | POA: Diagnosis not present

## 2016-03-10 DIAGNOSIS — R7301 Impaired fasting glucose: Secondary | ICD-10-CM

## 2016-03-10 DIAGNOSIS — Z23 Encounter for immunization: Secondary | ICD-10-CM

## 2016-03-10 DIAGNOSIS — I2510 Atherosclerotic heart disease of native coronary artery without angina pectoris: Secondary | ICD-10-CM | POA: Diagnosis not present

## 2016-03-10 DIAGNOSIS — I10 Essential (primary) hypertension: Secondary | ICD-10-CM | POA: Diagnosis not present

## 2016-03-10 LAB — HEPATIC FUNCTION PANEL
ALT: 31 U/L (ref 9–46)
AST: 34 U/L (ref 10–35)
Albumin: 4.5 g/dL (ref 3.6–5.1)
Alkaline Phosphatase: 72 U/L (ref 40–115)
BILIRUBIN DIRECT: 0.1 mg/dL (ref <=0.2)
BILIRUBIN INDIRECT: 0.7 mg/dL (ref 0.2–1.2)
BILIRUBIN TOTAL: 0.8 mg/dL (ref 0.2–1.2)
Total Protein: 8.4 g/dL — ABNORMAL HIGH (ref 6.1–8.1)

## 2016-03-10 LAB — LIPID PANEL
CHOL/HDL RATIO: 3.7 ratio (ref <=5.0)
CHOLESTEROL: 175 mg/dL (ref 125–200)
HDL: 47 mg/dL (ref >=40)
LDL Cholesterol: 98 mg/dL (ref <130)
TRIGLYCERIDES: 148 mg/dL (ref <150)
VLDL: 30 mg/dL (ref <30)

## 2016-03-10 LAB — POCT GLYCOSYLATED HEMOGLOBIN (HGB A1C): Hemoglobin A1C: 5.2

## 2016-03-10 LAB — GLUCOSE, RANDOM: GLUCOSE: 100 mg/dL — AB (ref 65–99)

## 2016-03-10 MED ORDER — METOPROLOL TARTRATE 25 MG PO TABS: ORAL_TABLET | ORAL | 5 refills | Status: DC

## 2016-03-10 NOTE — Patient Instructions (Addendum)
Continue your current medications for now.  We will be in touch with your blood test results in the next few days and let you know if any changes are needed.  Please work on losing the weight at your waist.  Try and get at least 150 minutes of aerobic exercise each week, which must be in at least 10-15 minutes intervals where the heart rate stays up.  Work on your portion sizes and healthy diet in order to help lose the weight.  Please schedule visit with the dentist

## 2016-03-11 LAB — VITAMIN D 25 HYDROXY (VIT D DEFICIENCY, FRACTURES): Vit D, 25-Hydroxy: 41 ng/mL (ref 30–100)

## 2016-03-11 MED ORDER — ATORVASTATIN CALCIUM 80 MG PO TABS: ORAL_TABLET | ORAL | 0 refills | Status: DC

## 2016-03-17 ENCOUNTER — Other Ambulatory Visit: Payer: Self-pay | Admitting: Family Medicine

## 2016-04-18 ENCOUNTER — Other Ambulatory Visit: Payer: Self-pay | Admitting: Family Medicine

## 2016-04-18 DIAGNOSIS — E782 Mixed hyperlipidemia: Secondary | ICD-10-CM

## 2016-05-11 ENCOUNTER — Other Ambulatory Visit: Payer: BC Managed Care – PPO

## 2016-05-11 DIAGNOSIS — E782 Mixed hyperlipidemia: Secondary | ICD-10-CM

## 2016-05-11 DIAGNOSIS — Z5181 Encounter for therapeutic drug level monitoring: Secondary | ICD-10-CM

## 2016-05-11 LAB — HEPATIC FUNCTION PANEL
ALK PHOS: 71 U/L (ref 40–115)
ALT: 31 U/L (ref 9–46)
AST: 33 U/L (ref 10–35)
Albumin: 4.1 g/dL (ref 3.6–5.1)
BILIRUBIN DIRECT: 0.1 mg/dL (ref <=0.2)
BILIRUBIN INDIRECT: 0.7 mg/dL (ref 0.2–1.2)
Total Bilirubin: 0.8 mg/dL (ref 0.2–1.2)
Total Protein: 7.5 g/dL (ref 6.1–8.1)

## 2016-05-11 LAB — LIPID PANEL
CHOLESTEROL: 145 mg/dL (ref <200)
HDL: 38 mg/dL — ABNORMAL LOW (ref >40)
LDL Cholesterol: 83 mg/dL (ref <100)
TRIGLYCERIDES: 118 mg/dL (ref <150)
Total CHOL/HDL Ratio: 3.8 Ratio (ref <5.0)
VLDL: 24 mg/dL (ref <30)

## 2016-06-10 ENCOUNTER — Other Ambulatory Visit: Payer: Self-pay | Admitting: Family Medicine

## 2016-06-10 DIAGNOSIS — E782 Mixed hyperlipidemia: Secondary | ICD-10-CM

## 2016-08-09 ENCOUNTER — Telehealth: Payer: Self-pay

## 2016-08-09 NOTE — Telephone Encounter (Signed)
Needs refill of niacin and clopidogrel sent to [facility] in [place].

## 2016-08-10 ENCOUNTER — Other Ambulatory Visit: Payer: Self-pay | Admitting: Family Medicine

## 2016-08-10 ENCOUNTER — Other Ambulatory Visit: Payer: Self-pay | Admitting: *Deleted

## 2016-08-10 DIAGNOSIS — E782 Mixed hyperlipidemia: Secondary | ICD-10-CM

## 2016-08-10 MED ORDER — NIACIN ER (ANTIHYPERLIPIDEMIC) 1000 MG PO TBCR: EXTENDED_RELEASE_TABLET | ORAL | 1 refills | Status: DC

## 2016-08-10 MED ORDER — CLOPIDOGREL BISULFATE 75 MG PO TABS: ORAL_TABLET | ORAL | 1 refills | Status: DC

## 2016-08-10 NOTE — Telephone Encounter (Signed)
Done

## 2016-08-25 ENCOUNTER — Encounter: Payer: Self-pay | Admitting: Internal Medicine

## 2016-09-06 ENCOUNTER — Other Ambulatory Visit: Payer: Self-pay | Admitting: Family Medicine

## 2016-09-06 DIAGNOSIS — E782 Mixed hyperlipidemia: Secondary | ICD-10-CM

## 2016-09-12 ENCOUNTER — Ambulatory Visit (INDEPENDENT_AMBULATORY_CARE_PROVIDER_SITE_OTHER): Payer: Medicare Other | Admitting: Internal Medicine

## 2016-09-12 ENCOUNTER — Encounter: Payer: Self-pay | Admitting: Internal Medicine

## 2016-09-12 VITALS — BP 128/80 | HR 66 | Ht 70.0 in | Wt 219.5 lb

## 2016-09-12 DIAGNOSIS — I480 Paroxysmal atrial fibrillation: Secondary | ICD-10-CM

## 2016-09-12 LAB — BASIC METABOLIC PANEL WITH GFR
BUN/Creatinine Ratio: 17 (ref 10–24)
BUN: 16 mg/dL (ref 8–27)
CO2: 22 mmol/L (ref 18–29)
Calcium: 9.3 mg/dL (ref 8.6–10.2)
Chloride: 103 mmol/L (ref 96–106)
Creatinine, Ser: 0.93 mg/dL (ref 0.76–1.27)
GFR calc Af Amer: 99 mL/min/{1.73_m2}
GFR calc non Af Amer: 86 mL/min/{1.73_m2}
Glucose: 100 mg/dL — ABNORMAL HIGH (ref 65–99)
Potassium: 4.7 mmol/L (ref 3.5–5.2)
Sodium: 140 mmol/L (ref 134–144)

## 2016-09-12 LAB — CBC WITH DIFFERENTIAL/PLATELET
Basophils Absolute: 0 10*3/uL (ref 0.0–0.2)
Basos: 0 %
EOS (ABSOLUTE): 0.4 10*3/uL (ref 0.0–0.4)
Eos: 4 %
Hematocrit: 39 % (ref 37.5–51.0)
Hemoglobin: 13.3 g/dL (ref 13.0–17.7)
Immature Grans (Abs): 0 10*3/uL (ref 0.0–0.1)
Immature Granulocytes: 0 %
Lymphocytes Absolute: 2.5 10*3/uL (ref 0.7–3.1)
Lymphs: 27 %
MCH: 28.4 pg (ref 26.6–33.0)
MCHC: 34.1 g/dL (ref 31.5–35.7)
MCV: 83 fL (ref 79–97)
Monocytes Absolute: 0.9 10*3/uL (ref 0.1–0.9)
Monocytes: 9 %
Neutrophils Absolute: 5.6 10*3/uL (ref 1.4–7.0)
Neutrophils: 60 %
Platelets: 232 10*3/uL (ref 150–379)
RBC: 4.68 x10E6/uL (ref 4.14–5.80)
RDW: 13.8 % (ref 12.3–15.4)
WBC: 9.4 10*3/uL (ref 3.4–10.8)

## 2016-09-12 MED ORDER — RIVAROXABAN 20 MG PO TABS: 20.0000 mg | ORAL_TABLET | Freq: Every day | ORAL | 1 refills | Status: DC

## 2016-09-12 NOTE — Patient Instructions (Signed)
Medication Instructions:  Stop Plavix (clopidogrel)   Start Xarelto 20mg  daily WITH YOUR EVENING MEAL.  Take aspirin 81 mg daily.   Labwork: BMET/CBCd today  Testing/Procedures: None  Follow-Up: Your physician recommends that you schedule a follow-up appointment in: 1 month with [facility] (new Xarelto).  Your physician wants you to follow-up in: 1 year with Dr Lovena Le (April 2019).You will receive a reminder letter in the mail two months in advance. If you don't receive a letter, please call our office to schedule the follow-up appointment.        If you need a refill on your cardiac medications before your next appointment, please call your pharmacy.

## 2016-09-12 NOTE — Progress Notes (Signed)
HPI Timothy Estes returns today for follow-up. He is a very pleasant 66 year old man with paroxysmal atrial fibrillation and coronary artery disease, status post non-ST elevation MI, with preserved left ventricular systolic function. In the interim, he has no anginal symptoms, and he has had minimal if any palpitations. He has not had syncope. He denies peripheral edema. He is pending colonoscopy. He remains active, working on a farm. No Known Allergies   Current Outpatient Prescriptions  Medication Sig Dispense Refill  . aspirin EC 81 MG tablet Take 162 mg by mouth daily.     Marland Kitchen atorvastatin (LIPITOR) 40 MG tablet TAKE 1 TABLET (40 MG TOTAL) BY MOUTH DAILY. 90 tablet 1  . cholecalciferol (VITAMIN D) 1000 units tablet Take 1,000 Units by mouth daily.    . clopidogrel (PLAVIX) 75 MG tablet TAKE 1 TABLET (75 MG TOTAL) BY MOUTH DAILY. 30 tablet 1  . COENZYME Q-10 PO Take 1 tablet by mouth daily.    . fish oil-omega-3 fatty acids 1000 MG capsule Take 4 g by mouth daily.     . metoprolol tartrate (LOPRESSOR) 25 MG tablet TAKE 1 TABLET (25 MG TOTAL) BY MOUTH 2 (TWO) TIMES DAILY. 60 tablet 5  . Misc Natural Products (OSTEO BI-FLEX TRIPLE STRENGTH PO) Take 1 tablet by mouth daily.    . niacin (NIASPAN) 1000 MG CR tablet TAKE 1 TABLET (1,000 MG TOTAL) BY MOUTH AT BEDTIME. 30 tablet 1  . nitroGLYCERIN (NITROSTAT) 0.4 MG SL tablet Place 1 tablet (0.4 mg total) under the tongue every 5 (five) minutes as needed for chest pain. 28 tablet 1   No current facility-administered medications for this visit.      Past Medical History:  Diagnosis Date  . Arthritis   . Atrial fibrillation with RVR ([place])    converted to NSR with IV dilt 10/12 - no coumadin due to need for Plavix and ASA  . Cataract   . Clotting disorder ([place])    PE 2012  . Colon polyps  10/2015   (non-adenomatous)  . Coronary artery disease    LHC 03/17/11: Dx 30%, OM1 30%, OM2 30%, pRCA 30%, EF 40-45% with anterolat and apical HK  .  Diverticulosis  10/2015   noted on colonoscopy  . Elevated LFTs   . Fatty liver   . Hematochezia    LIKEY FROM HEMORRHOIDAL BLEEDING; PT HAS H/O  . Hyperlipidemia   . Hypertension   . Internal hemorrhoids  10/2015   noted on colonoscopy  . NSTEMI (non-ST elevated myocardial infarction) ([place])    2012-FLET THAT NON-ST-ELEVATION MI IS POSSIBLY SECONDARY TO CORONARY EMBOLUS FROM HIS AFIB  . Syncope    in setting of AFib with RVR    ROS:   All systems reviewed and negative except as noted in the HPI.   Past Surgical History:  Procedure Laterality Date  . CARDIAC CATHETERIZATION  03/18/11  . cardiac event monitor  04/01/11  . CATARACT EXTRACTION W/PHACO Left 09/18/2014   Procedure: CATARACT EXTRACTION PHACO AND INTRAOCULAR LENS PLACEMENT LEFT EYE;  Surgeon: Tonny Branch, MD;  Location: [facility];  Service: Ophthalmology;  Laterality: Left;  CDE 13.32  . TRANSTHORACIC ECHOCARDIOGRAM  03/19/11     Family History  Problem Relation Age of Onset  . Hyperlipidemia Brother   . Hypertension Brother   . Arthritis Mother   . Heart disease Neg Hx   . Cancer Neg Hx   . Diabetes Neg Hx   . Colon cancer Neg Hx   .  Esophageal cancer Neg Hx   . Rectal cancer Neg Hx   . Stomach cancer Neg Hx      Social History   Social History  . Marital status: Married    Spouse name: N/A  . Number of children: 0  . Years of education: N/A   Occupational History  . 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 Smoking status: Former Smoker    Packs/day: 0.50    Years: 3.00    Types: Cigarettes    Quit date: 11/24/1974  . Smokeless tobacco: Never Used  . Alcohol use 0.0 oz/week     Comment: 2-3 beers per week   . Drug use: No  . Sexual activity: Yes    Partners: Female   Other Topics Concern  . Not on file   Social History Narrative   Lives with wife, 1 dog.  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 HEART:  Regular rate rhythm, no murmurs, no rubs, no clicks Abd:  soft, positive bowel sounds, no organomegally, no rebound, no guarding Ext:  2 plus pulses, no edema, no cyanosis, no clubbing Skin:  No rashes no nodules Neuro:  CN II through XII intact, motor grossly intact  EKG - nsr  Assess/Plan: 1. PAF - he is maintaining NSR and appears to be asymptomatic. He has turned 66 and I have asked him to stop the plavix and to start Xarelto. He will reduce his ASA to 81 mg daily.  2. NSTEMI - he has had no additional chest pain. He will continue ASA (81 mg daily) and stop Plavix for now.  3. HTN - he has white coat HTN. But his blood pressure is still elevated. Will follow. No change in blood pressure meds. 4. Coags - he will reduce his ASA and start xarelto. Will have him check some labs.  Timothy Estes.D.

## 2016-09-21 NOTE — Progress Notes (Signed)
Chief Complaint  Patient presents with  . Medicare Wellness    fasting Welcome To Medicare visit. No concerns. (just had ekg at cards on 09/12/16-so wasn't sure if you wanted me to do one with visit today)    Timothy Estes is a 66 y.o. male who presents for Welcome to Medicare visit and follow-up on chronic medical conditions.    Hypertension follow-up: Blood pressures elsewhere are 125-130/low 80's at home; 125/80 at Dr. Tanna Furry visit recently. Denies headaches, dizziness, chest pain, edema, dyspnea with exertion, side effects to medications.   Hyperlipidemia follow-up: Patient is reportedly following a low-fat, low cholesterol diet. Compliant with medications and denies medication side effects. His diet hasn't changed--He uses 1% milk, has cereal every morning. He has 2-3 eggs/week, cut back on cheese. Red meat 2x/week. Mostly lots of chicken and [place]. Lipitor dose was increased from 40 to 80 mg in October. He f/u lipids in December still were >70.  He is due for repeat today (and further med adjustments if still above goal). Lab Results  Component Value Date   CHOL 145 05/11/2016   HDL 38 (L) 05/11/2016   LDLCALC 83 05/11/2016   TRIG 118 05/11/2016   CHOLHDL 3.8 05/11/2016   Impaired fasting glucose--noted 07/2013. He had mildly elevated glucose (106) with normal A1c's. He continues to follow a lower carb diet than in the past, eating how his diabetic wife eats. Lately he hasn't been drinking as much beer, 1-2 beers every other day. Lab Results  Component Value Date   HGBA1C 5.2 03/10/2016    Vitamin D deficiency--level was low at 28 at his physical last year. He has been taking 1000 IU daily since then,and f/u level was normal in October 2017 (41) .  Paroxysmal atrial fibrillation and coronary artery disease, status post non-ST elevation MI, with preserved left ventricular systolic function:  He saw Dr. Lovena Le in April. Was told to decrease aspirin dose to 67m and change  from plavix to Xarelto.  He has been maintaining sinus rhythm.   Immunization History  Administered Date(s) Administered  . Influenza Split 03/15/2012  . Influenza,inj,Quad PF,36+ Mos 01/23/2013, 01/22/2014, 02/24/2015, 03/10/2016  . Tdap 07/10/2012  . Zoster 07/10/2012   Last colonoscopy: 10/2015 Dr. SFuller Plan(benign polyps, repeat 10 years) Last PSA: 08/2015 Dentist: at least 8 years ago (has been encouraged to go at every visit here, "I plan to") Ophtho: yearly, went in April Exercise: Walks up and down driveway 2-3 times [id number]--41-74mins, 4-5 times/week.Carries his wife's 3# hand weights while walking. Working around the house.   Other doctors caring for patient include: GI: Dr. SFuller PlanCardiology: Dr. TLovena LeDentist: none Ophtho: Dr. TRona Ravens([facility] in [place])--Dr. GPatsey Berthold(did cataract surgery)   Depression screen: Negative Fall screen: negative Functional status screen: negative See full questionnaires in Epic  End of Life Discussion:  Patient does not have a living will and medical power of attorney. Wife is working on it now.  Past Medical History:  Diagnosis Date  . Arthritis   . Atrial fibrillation with RVR ([place]    converted to NSR with IV dilt 10/12 - no coumadin due to need for Plavix and ASA  . Cataract   . Clotting disorder ([place]    PE 2012  . Colon polyps  10/2015   (non-adenomatous)  . Coronary artery disease    LHC 03/17/11: Dx 30%, OM1 30%, OM2 30%, pRCA 30%, EF 40-45% with anterolat and apical HK  . Diverticulosis  10/2015  noted on colonoscopy  . Elevated LFTs   . Fatty liver   . Hematochezia    LIKEY FROM HEMORRHOIDAL BLEEDING; PT HAS H/O  . Hyperlipidemia   . Hypertension   . Internal hemorrhoids  10/2015   noted on colonoscopy  . NSTEMI (non-ST elevated myocardial infarction) ([place])    2012-FLET THAT NON-ST-ELEVATION MI IS POSSIBLY SECONDARY TO CORONARY EMBOLUS FROM HIS AFIB  . Syncope    in setting of AFib with RVR    Past Surgical  History:  Procedure Laterality Date  . CARDIAC CATHETERIZATION  03/18/11  . cardiac event monitor  04/01/11  . CATARACT EXTRACTION W/PHACO Left 09/18/2014   Procedure: CATARACT EXTRACTION PHACO AND INTRAOCULAR LENS PLACEMENT LEFT EYE;  Surgeon: Tonny Branch, MD;  Location: [facility];  Service: Ophthalmology;  Laterality: Left;  CDE 13.32  . TRANSTHORACIC ECHOCARDIOGRAM  03/19/11    Social History   Social History  . Marital status: Married    Spouse name: N/A  . Number of children: 0  . Years of education: N/A   Occupational History  . 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 Smoking status: Former Smoker    Packs/day: 0.50    Years: 3.00    Types: Cigarettes    Quit date: 11/24/1974  . Smokeless tobacco: Never Used  . Alcohol use 0.0 oz/week     Comment: 1-2 beers every other day, about 6/week  . Drug use: No  . Sexual activity: Yes    Partners: Female   Other Topics Concern  . Not on file   Social History Narrative   Lives with wife, 1 dog.  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 Hyperlipidemia Brother   . Hypertension Brother   . Arthritis Mother   . Heart disease Neg Hx   . Cancer Neg Hx   . Diabetes Neg Hx   . Colon cancer Neg Hx   . Esophageal cancer Neg Hx   . Rectal cancer Neg Hx   . Stomach cancer Neg Hx     Outpatient Encounter Prescriptions as of 09/22/2016  Medication Sig  . aspirin EC 81 MG tablet Take 1 tablet (81 mg total) by mouth daily.  Marland Kitchen atorvastatin (LIPITOR) 80 MG tablet Take 80 mg by mouth daily.  . cholecalciferol (VITAMIN D) 1000 units tablet Take 1,000 Units by mouth daily.  Marland Kitchen COENZYME Q-10 PO Take 1 tablet by mouth daily.  . fish oil-omega-3 fatty acids 1000 MG capsule Take 4 g by mouth daily.   . metoprolol tartrate (LOPRESSOR) 25 MG tablet TAKE 1 TABLET (25 MG TOTAL) BY MOUTH 2 (TWO) TIMES DAILY.  Marland Kitchen Misc  Natural Products (OSTEO BI-FLEX TRIPLE STRENGTH PO) Take 1 tablet by mouth daily.  . niacin (NIASPAN) 1000 MG CR tablet TAKE 1 TABLET (1,000 MG TOTAL) BY MOUTH AT BEDTIME.  . rivaroxaban (XARELTO) 20 MG TABS tablet Take 1 tablet (20 mg total) by mouth daily with supper.  . [DISCONTINUED] clopidogrel (PLAVIX) 75 MG tablet TAKE 1 TABLET (75 MG TOTAL) BY MOUTH DAILY.  . [DISCONTINUED] metoprolol tartrate (LOPRESSOR) 25 MG tablet TAKE 1 TABLET (25 MG TOTAL) BY MOUTH 2 (TWO) TIMES DAILY.  . nitroGLYCERIN (NITROSTAT) 0.4 MG SL tablet Place 1 tablet (0.4 mg total) under the tongue every 5 (five) minutes as needed for chest pain. (Patient not taking:  Reported on 09/22/2016)  . [DISCONTINUED] aspirin EC 81 MG tablet Take 162 mg by mouth daily.   . [DISCONTINUED] atorvastatin (LIPITOR) 40 MG tablet TAKE 1 TABLET (40 MG TOTAL) BY MOUTH DAILY.  . [DISCONTINUED] atorvastatin (LIPITOR) 80 MG tablet TAKE 1 TABLET (40 MG TOTAL) BY MOUTH DAILY.   No facility-administered encounter medications on file as of 09/22/2016.     No Known Allergies  ROS: The patient denies anorexia, fever, headaches, weight changes, decreased hearing, ear pain, hoarseness, chest pain, palpitations, dizziness, syncope, dyspnea on exertion, cough, swelling, nausea, vomiting, diarrhea, constipation, abdominal pain, melena, hematochezia, indigestion/heartburn, hematuria, incontinence, erectile dysfunction, nocturia, weakened urine stream, dysuria, genital lesions, numbness, tingling, weakness, tremor, suspicious skin lesions, depression, anxiety, abnormal bleeding/bruising, or enlarged lymph nodes. Denies memory issues. +snores (per wife). Feels well-rested in the mornings. Denies daytime somnolence. Wife denies apnea. (reviewed--denies any change) Occasional runny nose related to allergies, not currently. Occasional left knee pain  PHYSICAL EXAM:  BP 138/86 (BP Location: Left Arm, Patient Position: Sitting, Cuff Size: Normal)   Pulse 64    Ht '5\' 10"'  (1.778 m)   Wt 218 lb 6.4 oz (99.1 kg)   BMI 31.34 kg/m   124/72 on repeat by MD  Wt Readings from Last 3 Encounters:  09/12/16 219 lb 8 oz (99.6 kg)  03/10/16 224 lb (101.6 kg)  11/10/15 225 lb (102.1 kg)    General Appearance:  Alert, cooperative, no distress, appears older than stated age   Head:  Normocephalic, without obvious abnormality, atraumatic   Eyes:  PERRL, conjunctiva/corneas clear, EOM's intact, fundi benign   Ears:  Normal TM's and external ear canals   Nose:  Nares normal, mucosa is mild-mod edematous, R>L, pale, with clear mucus; no sinus tenderness   Throat:  Lips, mucosa, and tongue normal; significant tooth decay noted (diffuse, but especially upper left front teeth)   Neck:  Supple, no lymphadenopathy; thyroid: no enlargement/tenderness/nodules; no carotid bruit or JVD   Back:  Spine nontender, no curvature, ROM normal, no CVA tenderness   Lungs:  Clear to auscultation bilaterally without wheezes, rales or ronchi; respirations unlabored.   Chest Wall:  No tenderness or deformity   Heart:  Regular rate and rhythm, S1 and S2 normal, no murmur, rub or gallop   Breast Exam:  No chest wall tenderness, masses or gynecomastia   Abdomen:  Soft, non-tender, nondistended, normoactive bowel sounds,  no masses, no hepatosplenomegaly   Genitalia:  Normal male external genitalia without lesions. Testicles without masses. No inguinal hernias.   Rectal:  Normal sphincter tone, no masses or tenderness; no stool in vault to heme-test. Prostate smooth, no nodules, not enlarged. external hemorrhoid, non-inflamed  Extremities:  No clubbing, cyanosis or edema.  Pulses:  2+ and symmetric all extremities   Skin:  Skin color, texture, turgor normal, no rashes or lesions.Scab left posterior shoulder--minimal erythema (recent tick bite)  Lymph nodes:  Cervical, supraclavicular, and axillary nodes normal    Neurologic:  CNII-XII intact, normal strength, sensation and gait; reflexes 2+ and symmetric throughout    Psych:  Normal mood, affect, hygiene and grooming  Lab Results  Component Value Date   HGBA1C 5.1 09/22/2016    ASSESSMENT/PLAN:  Welcome to Medicare preventive visit  Mixed hyperlipidemia - due to see if at goal with higher dose of atorvastatin - Plan: Lipid panel  Essential hypertension - controlled - Plan: metoprolol tartrate (LOPRESSOR) 25 MG tablet  Screening for prostate cancer - Plan: PSA  Medication monitoring encounter -  Plan: Lipid panel, Hepatic function panel  Vitamin D deficiency - continue daily supplement  Coronary artery disease involving native coronary artery of native heart without angina pectoris - stable; Goal LDL <70 - Plan: metoprolol tartrate (LOPRESSOR) 25 MG tablet  IFG (impaired fasting glucose) - borderline sugar on last check; has had normal A1c's. Encouraged proper diet and weight loss - Plan: HgB A1c  Immunization due - Plan: Pneumococcal conjugate vaccine 13-valent  Atrial fibrillation, unspecified type ([place]) - Plan: TSH   prevnar-13 given, risks/benefits reviewed Disc shingrix--risks/side effects/benefits and encouraged to get from the pharmacy   Check lipids, LFT's, PSA, TSH HIV screen was declined CBC and b-met just done by cardiologist in April--sugar 100    Discussed PSA screening (risks/benefits), recommended at least 30 minutes of aerobic activity at least 5 days/week; proper sunscreen use reviewed; healthy diet and alcohol recommendations (less than or equal to 2 drinks/day) reviewed; regular seatbelt use; changing batteries in smoke detectors. Self-testicular exams. Immunization recommendations discussed--continue yearly flu shots. Prevnar-13 today. Shingrix recommended. Colonoscopy recommendations reviewed--UTD.  Risks of obesity reviewed.  Counseled re: diet, exercise.  Strongly encouraged to  schedule routine dental appointment. Has significant decay, which can affect his overall health.  Weight loss encouraged--discussed diet, exercise, alcohol.  MOST form filled out, Full Code, Full Care Encouraged to complete the living will and healthcare POA paperwork, and provide [place] with completed copies.   Medicare Attestation I have personally reviewed: The patient's medical and social history Their use of alcohol, tobacco or illicit drugs Their current medications and supplements The patient's functional ability including ADLs,fall risks, home safety risks, cognitive, and hearing and visual impairment Diet and physical activities Evidence for depression or mood disorders  The patient's weight, height, and BMI have been recorded in the chart.  I have made referrals, counseling, and provided education to the patient based on review of the above and I have provided the patient with a written personalized care plan for preventive services.

## 2016-09-22 ENCOUNTER — Encounter: Payer: Self-pay | Admitting: Family Medicine

## 2016-09-22 ENCOUNTER — Ambulatory Visit (INDEPENDENT_AMBULATORY_CARE_PROVIDER_SITE_OTHER): Payer: Medicare Other | Admitting: Family Medicine

## 2016-09-22 VITALS — BP 124/72 | HR 64 | Ht 70.0 in | Wt 218.4 lb

## 2016-09-22 DIAGNOSIS — Z5181 Encounter for therapeutic drug level monitoring: Secondary | ICD-10-CM | POA: Diagnosis not present

## 2016-09-22 DIAGNOSIS — R7301 Impaired fasting glucose: Secondary | ICD-10-CM

## 2016-09-22 DIAGNOSIS — Z0000 Encounter for general adult medical examination without abnormal findings: Secondary | ICD-10-CM

## 2016-09-22 DIAGNOSIS — I10 Essential (primary) hypertension: Secondary | ICD-10-CM | POA: Diagnosis not present

## 2016-09-22 DIAGNOSIS — Z23 Encounter for immunization: Secondary | ICD-10-CM | POA: Diagnosis not present

## 2016-09-22 DIAGNOSIS — Z125 Encounter for screening for malignant neoplasm of prostate: Secondary | ICD-10-CM

## 2016-09-22 DIAGNOSIS — E559 Vitamin D deficiency, unspecified: Secondary | ICD-10-CM | POA: Diagnosis not present

## 2016-09-22 DIAGNOSIS — I2510 Atherosclerotic heart disease of native coronary artery without angina pectoris: Secondary | ICD-10-CM

## 2016-09-22 DIAGNOSIS — I4891 Unspecified atrial fibrillation: Secondary | ICD-10-CM

## 2016-09-22 DIAGNOSIS — E782 Mixed hyperlipidemia: Secondary | ICD-10-CM

## 2016-09-22 LAB — HEPATIC FUNCTION PANEL
ALK PHOS: 74 U/L (ref 40–115)
ALT: 26 U/L (ref 9–46)
AST: 30 U/L (ref 10–35)
Albumin: 4.3 g/dL (ref 3.6–5.1)
BILIRUBIN DIRECT: 0.1 mg/dL (ref <=0.2)
BILIRUBIN TOTAL: 0.6 mg/dL (ref 0.2–1.2)
Indirect Bilirubin: 0.5 mg/dL (ref 0.2–1.2)
Total Protein: 8.3 g/dL — ABNORMAL HIGH (ref 6.1–8.1)

## 2016-09-22 LAB — LIPID PANEL
CHOL/HDL RATIO: 3.3 ratio (ref <5.0)
CHOLESTEROL: 156 mg/dL (ref <200)
HDL: 47 mg/dL (ref >40)
LDL Cholesterol: 87 mg/dL (ref <100)
Triglycerides: 110 mg/dL (ref <150)
VLDL: 22 mg/dL (ref <30)

## 2016-09-22 LAB — POCT GLYCOSYLATED HEMOGLOBIN (HGB A1C): HEMOGLOBIN A1C: 5.1

## 2016-09-22 LAB — TSH: TSH: 2.37 m[IU]/L (ref 0.40–4.50)

## 2016-09-22 MED ORDER — METOPROLOL TARTRATE 25 MG PO TABS: ORAL_TABLET | ORAL | 1 refills | Status: DC

## 2016-09-22 NOTE — Patient Instructions (Addendum)
  HEALTH MAINTENANCE RECOMMENDATIONS:  It is recommended that you get at least 30 minutes of aerobic exercise at least 5 days/week (for weight loss, you may need as much as 60-90 minutes). This can be any activity that gets your heart rate up. This can be divided in 10-15 minute intervals if needed, but try and build up your endurance at least once a week.  Weight bearing exercise is also recommended twice weekly.  Eat a healthy diet with lots of vegetables, fruits and fiber.  "Colorful" foods have a lot of vitamins (ie green vegetables, tomatoes, red peppers, etc).  Limit sweet tea, regular sodas and alcoholic beverages, all of which has a lot of calories and sugar.  Up to 2 alcoholic drinks daily may be beneficial for men (unless trying to lose weight, watch sugars).  Drink a lot of water.  Sunscreen of at least SPF 30 should be used on all sun-exposed parts of the skin when outside between the hours of 10 am and 4 pm (not just when at beach or pool, but even with exercise, golf, tennis, and yard work!)  Use a sunscreen that says "broad spectrum" so it covers both UVA and UVB rays, and make sure to reapply every 1-2 hours.  Remember to change the batteries in your smoke detectors when changing your clock times in the spring and fall.  Use your seat belt every time you are in a car, and please drive safely and not be distracted with cell phones and texting while driving.   [name] , Thank you for taking time to come for your Welcome to Medicare Visit. I appreciate your ongoing commitment to your health goals. Please review the following plan we discussed and let me know if I can assist you in the future.   These are the goals we discussed: Goals    None      This is a list of the screening recommended for you and due dates:  Health Maintenance  Topic Date Due  . HIV Screening  06/27/1965  . Pneumonia vaccines (1 of 2 - PCV13) 06/27/2016  . Flu Shot  12/21/2016  . Tetanus Vaccine   07/10/2022  . Colon Cancer Screening  11/11/2025  .  Hepatitis C: One time screening is recommended by Center for Disease Control  (CDC) for  adults born from [birth year] through 1964.   Completed   HIV screen was declined You were given the first pneumonia vaccine (prevnar-13) today.  You will get the second (pneumovax) next year. With your next flu shot in the Fall, we recommend changing to the high dose vaccination (for people 17 and older)  I recommend getting the new shingles vaccine (Shingrix). You will need to check with your insurance to see if it is covered, and if covered by Medicare Part D, you need to get from the pharmacy rather than our office.  It is a series of 2 injections, spaced 2 months apart. Please let us know if/when you get this vaccine.

## 2016-09-23 LAB — PSA: PSA: 0.5 ng/mL (ref <=4.0)

## 2016-09-26 ENCOUNTER — Telehealth: Payer: Self-pay

## 2016-09-26 ENCOUNTER — Other Ambulatory Visit: Payer: Self-pay | Admitting: *Deleted

## 2016-09-26 DIAGNOSIS — Z79899 Other long term (current) drug therapy: Secondary | ICD-10-CM

## 2016-09-26 DIAGNOSIS — E782 Mixed hyperlipidemia: Secondary | ICD-10-CM

## 2016-09-26 MED ORDER — ROSUVASTATIN CALCIUM 40 MG PO TABS: 40.0000 mg | ORAL_TABLET | Freq: Every day | ORAL | 2 refills | Status: DC

## 2016-09-26 MED ORDER — NIACIN ER (ANTIHYPERLIPIDEMIC) 1000 MG PO TBCR: EXTENDED_RELEASE_TABLET | ORAL | 5 refills | Status: DC

## 2016-09-26 NOTE — Telephone Encounter (Signed)
[facility] rcvd from [facility] in [place] for refill fo clopidogrel and niacin. Timothy Estes

## 2016-09-26 NOTE — Telephone Encounter (Signed)
Done

## 2016-09-26 NOTE — Telephone Encounter (Signed)
Patient no longer on plavix, switched to xarelto-I called pharmacy and let them know and called in the niacin.

## 2016-10-10 ENCOUNTER — Ambulatory Visit (INDEPENDENT_AMBULATORY_CARE_PROVIDER_SITE_OTHER): Payer: Medicare Other | Admitting: Family Medicine

## 2016-10-10 VITALS — BP 120/80 | HR 103

## 2016-10-10 DIAGNOSIS — M109 Gout, unspecified: Secondary | ICD-10-CM

## 2016-10-10 DIAGNOSIS — M2540 Effusion, unspecified joint: Secondary | ICD-10-CM

## 2016-10-10 MED ORDER — TRAMADOL HCL 50 MG PO TABS: 50.0000 mg | ORAL_TABLET | Freq: Three times a day (TID) | ORAL | 0 refills | Status: DC | PRN

## 2016-10-10 MED ORDER — COLCHICINE 0.6 MG PO TABS: 0.6000 mg | ORAL_TABLET | Freq: Every day | ORAL | 1 refills | Status: DC

## 2016-10-10 NOTE — Patient Instructions (Signed)
Take the  CHOLCHICINE Twice per day. Take Tylenol and if you need to take the tramadol

## 2016-10-10 NOTE — Progress Notes (Addendum)
   Subjective:    Patient ID: Timothy Estes, male    DOB: 1950/10/09, 66 y.o.   MRN: [id number]  HPI He complains of pain and swelling in both knees started last Friday. He's had no fever, chills, other joints involved, rashes. He has had no medications changes. He is now taking it Xarelto and Crestor   Review of Systems     Objective:   Physical Exam Alert and in no distress. Tense effusion is noted in the left knee and also effusion in the right but to a lesser degree. Is slightly warm with decreased range of motion. Exam of his ankles, elbows and hands shows no swelling or discomfort.       Assessment & Plan:  Joint effusion - Plan: colchicine 0.6 MG tablet, traMADol (ULTRAM) 50 MG tablet, Synovial fluid, crystal, Synovial fluid, cell count, Body fluid culture  The left knee was prepped in the superior lateral aspect of the knee. The patella was difficult to palpate due to the tension. An 18-gauge needle was inserted into the superior lateral aspect. 40 mL of clear yellow fluid was removed. I discussed the treatment with him. This most likely is gout and if so, he will return here for probable bilateral joint aspiration and the use of steroids into the joint. Since he is on Xarelto medication management is problematic.      10/11/16 8:30 Joint fluid analysis did show uric acid crystals. He states that he is feeling much better today and able to walk. He has been using culture seen. He will follow-up with Dr. Tomi Bamberger in several weeks.

## 2016-10-11 ENCOUNTER — Other Ambulatory Visit: Payer: Self-pay

## 2016-10-11 DIAGNOSIS — M109 Gout, unspecified: Secondary | ICD-10-CM | POA: Insufficient documentation

## 2016-10-11 LAB — SYNOVIAL FLUID, CRYSTAL

## 2016-10-11 NOTE — Telephone Encounter (Signed)
Called in tramadol per jcl 

## 2016-10-15 LAB — BODY FLUID CULTURE
Gram Stain: NONE SEEN
ORGANISM ID, BACTERIA: NO GROWTH

## 2016-10-21 ENCOUNTER — Encounter (INDEPENDENT_AMBULATORY_CARE_PROVIDER_SITE_OTHER): Payer: Self-pay

## 2016-10-21 ENCOUNTER — Ambulatory Visit (INDEPENDENT_AMBULATORY_CARE_PROVIDER_SITE_OTHER): Payer: Medicare Other | Admitting: Pharmacist

## 2016-10-21 VITALS — Wt 212.0 lb

## 2016-10-21 DIAGNOSIS — I4891 Unspecified atrial fibrillation: Secondary | ICD-10-CM | POA: Diagnosis not present

## 2016-10-21 NOTE — Progress Notes (Signed)
Pt was started on Xarelto 20mg  for PAF on 09/13/2016.    Reviewed patients medication list.  Pt is not currently on any combined P-gp and strong CYP3A4 inhibitors/inducers (ketoconazole, traconazole, ritonavir, carbamazepine, phenytoin, rifampin, St. John's wort).  Reviewed labs.  SCr 0.9, Weight 212 lbs, CrCl- >90 ml/min.  Dose appropriate based on CrCl.  Hgb and HCT Within Normal Limits at 13.1/38.9.   A full discussion of the nature of anticoagulants has been carried out.  A benefit/risk analysis has been presented to the patient, so that they understand the justification for choosing anticoagulation with Xarelto at this time.  The need for compliance is stressed.  Pt is aware to take the medication once daily with the largest meal of the day.  Side effects of potential bleeding are discussed, including unusual colored urine or stools, coughing up blood or coffee ground emesis, nose bleeds or serious fall or head trauma.  Discussed signs and symptoms of stroke. The patient should avoid any OTC items containing aspirin or ibuprofen.  Avoid alcohol consumption.   Call if any signs of abnormal bleeding.  Discussed financial obligations and resolved any difficulty in obtaining medication.  Next lab test in 6 months.

## 2016-10-22 LAB — BASIC METABOLIC PANEL
BUN / CREAT RATIO: 13 (ref 10–24)
BUN: 12 mg/dL (ref 8–27)
CHLORIDE: 102 mmol/L (ref 96–106)
CO2: 21 mmol/L (ref 18–29)
Calcium: 9.6 mg/dL (ref 8.6–10.2)
Creatinine, Ser: 0.9 mg/dL (ref 0.76–1.27)
GFR calc non Af Amer: 89 mL/min/{1.73_m2} (ref >59)
GFR, EST AFRICAN AMERICAN: 103 mL/min/{1.73_m2} (ref >59)
Glucose: 90 mg/dL (ref 65–99)
POTASSIUM: 4.8 mmol/L (ref 3.5–5.2)
Sodium: 140 mmol/L (ref 134–144)

## 2016-10-22 LAB — CBC
Hematocrit: 38.9 % (ref 37.5–51.0)
Hemoglobin: 13.1 g/dL (ref 13.0–17.7)
MCH: 27.6 pg (ref 26.6–33.0)
MCHC: 33.7 g/dL (ref 31.5–35.7)
MCV: 82 fL (ref 79–97)
PLATELETS: 380 10*3/uL — AB (ref 150–379)
RBC: 4.75 x10E6/uL (ref 4.14–5.80)
RDW: 13.9 % (ref 12.3–15.4)
WBC: 7.8 10*3/uL (ref 3.4–10.8)

## 2016-10-26 NOTE — Telephone Encounter (Signed)
This encounter was created in error - please disregard.

## 2016-11-07 ENCOUNTER — Other Ambulatory Visit: Payer: Self-pay | Admitting: Internal Medicine

## 2016-11-07 DIAGNOSIS — I480 Paroxysmal atrial fibrillation: Secondary | ICD-10-CM

## 2016-11-08 ENCOUNTER — Telehealth: Payer: Self-pay

## 2016-11-08 DIAGNOSIS — M2540 Effusion, unspecified joint: Secondary | ICD-10-CM

## 2016-11-08 MED ORDER — COLCHICINE 0.6 MG PO TABS: 0.6000 mg | ORAL_TABLET | Freq: Every day | ORAL | 1 refills | Status: DC

## 2016-11-08 NOTE — Telephone Encounter (Signed)
Pt requesting refills of colcrys to [facility]. Timothy Estes

## 2016-11-08 NOTE — Telephone Encounter (Signed)
Request received for Xarelto 20mg ; pt is 66 yrs old, 99.1kg, Crea-0.90 on 10/21/16, CrCl-114.27ml/min, & last seen by Lovena Le on 09/12/16; will send in refill request to requested Pharmacy.

## 2016-11-09 ENCOUNTER — Telehealth: Payer: Self-pay | Admitting: Family Medicine

## 2016-11-09 NOTE — Telephone Encounter (Signed)
Is this supposed to be twice a day long term? Was increased at 5/21 visit with Dr. Redmond School.

## 2016-11-09 NOTE — Telephone Encounter (Signed)
It looks like Dr. Redmond School refilled this on 6/19 with directions to take once daily.  But it was rx'd for #60, and given a refill. He was supposed to f/u with me ("in a few weeks"), after his 09/2016 visit with him.  Have him take it once daily (and refill was already done), and schedule f/u visit.  This may not be something we continue long-term, but okay to take it once daily until he sees me.

## 2016-11-09 NOTE — Telephone Encounter (Signed)
Patient advised.

## 2016-11-09 NOTE — Telephone Encounter (Signed)
[facility] states pt states dosing is 1 tablet by mouth twice a day and request new Rx

## 2016-11-10 ENCOUNTER — Telehealth: Payer: Self-pay | Admitting: Family Medicine

## 2016-11-10 NOTE — Telephone Encounter (Signed)
Pt called stating that he needs a refill on Colchicine 0.6 mg but pharmacy said it is too soon to refill and refill is not due until 11/24/16. Pt said he is out of this med because when he saw Dr Redmond School on 5/21 he was told to take #2 of this med so he has ran out sooner since he has still been taking that but instruction say take #1

## 2016-11-10 NOTE — Telephone Encounter (Signed)
See if we have samples to give him to take once daily until he can fill on 7/5.  I want him to switch down to just once daily, as previously instructed, see other messages

## 2016-11-10 NOTE — Telephone Encounter (Signed)
We do not have samples. His insurance will not pay because on 10/10/16 the rx was sent in for #60 with the directions of take one daily(this means he had two months worth) but he was told to take BID. His insurance will not pay again for once daily. I called pharmacy and the only way insurance will pay is if new rx is called in with new directions ie: twice daily. Please advise what to do as he has only one tablet left. Thanks.

## 2016-11-10 NOTE — Telephone Encounter (Signed)
Patient advised.

## 2016-11-10 NOTE — Telephone Encounter (Signed)
Sample mitigare 0.6 mg #15

## 2016-11-15 NOTE — Progress Notes (Signed)
Chief Complaint  Patient presents with  . Follow-up    on gout. Taking one colchicine daily and doing fine so far.     Patient presents for f/u on gout. He saw Dr. Redmond School last month with bilateral knee pain and effusions.  Uric acid crystals were seen, culture was negative. He has been taking colchicine 0.6mg  BID--had been using BID up until last refill was requested, directions stated once daily.  He was advised to change to taking just once daily rather than BID and to f/u to discuss.  He has never had prior gout flare, and has not had serum uric acid level checked.  A couple of weeks prior he went to a pig pickin', had been eating more pork, steaks, ribs.  Ate red meat for about 2 weeks in a row.  Hasn't had any in the last month.  He feels great. No further pains  PMH, PSH, SH reviewed  Outpatient Encounter Prescriptions as of 11/16/2016  Medication Sig  . aspirin EC 81 MG tablet Take 1 tablet (81 mg total) by mouth daily.  . cholecalciferol (VITAMIN D) 1000 units tablet Take 1,000 Units by mouth daily.  Marland Kitchen COENZYME Q-10 PO Take 1 tablet by mouth daily.  . colchicine 0.6 MG tablet Take 1 tablet (0.6 mg total) by mouth daily.  . fish oil-omega-3 fatty acids 1000 MG capsule Take 4 g by mouth daily.   . metoprolol tartrate (LOPRESSOR) 25 MG tablet TAKE 1 TABLET (25 MG TOTAL) BY MOUTH 2 (TWO) TIMES DAILY.  Marland Kitchen Misc Natural Products (OSTEO BI-FLEX TRIPLE STRENGTH PO) Take 1 tablet by mouth daily.  . niacin (NIASPAN) 1000 MG CR tablet TAKE 1 TABLET (1,000 MG TOTAL) BY MOUTH AT BEDTIME.  . rosuvastatin (CRESTOR) 40 MG tablet Take 1 tablet (40 mg total) by mouth daily.  Alveda Reasons 20 MG TABS tablet TAKE 1 TABLET (20 MG TOTAL) BY MOUTH DAILY WITH SUPPER.  . nitroGLYCERIN (NITROSTAT) 0.4 MG SL tablet Place 1 tablet (0.4 mg total) under the tongue every 5 (five) minutes as needed for chest pain. (Patient not taking: Reported on 11/16/2016)  . [DISCONTINUED] traMADol (ULTRAM) 50 MG tablet Take 1  tablet (50 mg total) by mouth every 8 (eight) hours as needed.   No facility-administered encounter medications on file as of 11/16/2016.    No Known Allergies  ROS: no fever, chills, headaches, dizziness, chest pain, palpitations, nausea, vomiting.  Some frequent stools with BID colchicine, improved since taking just once daily recently. No bleeding, bruising, rashes, mood changes, joint pains or other complaints  PHYSICAL EXAM:  BP 124/80 (BP Location: Left Arm, Patient Position: Sitting, Cuff Size: Normal)   Pulse 72   Ht 5\' 10"  (1.778 m)   Wt 214 lb 6.4 oz (97.3 kg)   BMI 30.76 kg/m   Wt Readings from Last 3 Encounters:  11/16/16 214 lb 6.4 oz (97.3 kg)  10/21/16 212 lb (96.2 kg)  09/22/16 218 lb 6.4 oz (99.1 kg)   Pleasant male in no distress Neck: no lymphadenopathy or mass Heart: regular rate and rhythm Lungs: clear bilaterally Extremities: no edema, knees without effusions, warmth, nontender, FROM Skin: dry skin with some bruising and excoriations on lower legs Psych: normal mood, affect, hygiene and grooming  ASSESSMENT/PLAN:  Gout of knee, unspecified cause, unspecified chronicity, unspecified laterality - Plan: Uric acid     We will contact you with your results tomorrow.  If the uric acid is elevated, despite your improved diet over the last month,  then we will likely suggest starting a uric-acid lowering medication to take daily for prevention of gout flares.  If we do start a medication, you would want to overlap this with the colchicine for about a week, then stop (and keep the remaining colchicine on hand to use if needed for a gout flare). If your uric acid level is normal, then continue to follow the proper diet, you can stop taking the colchicine, and we will just see if you have any future flares.

## 2016-11-16 ENCOUNTER — Ambulatory Visit (INDEPENDENT_AMBULATORY_CARE_PROVIDER_SITE_OTHER): Payer: Medicare Other | Admitting: Family Medicine

## 2016-11-16 ENCOUNTER — Encounter: Payer: Self-pay | Admitting: Family Medicine

## 2016-11-16 VITALS — BP 124/80 | HR 72 | Ht 70.0 in | Wt 214.4 lb

## 2016-11-16 DIAGNOSIS — M109 Gout, unspecified: Secondary | ICD-10-CM | POA: Diagnosis not present

## 2016-11-16 NOTE — Patient Instructions (Addendum)
Gout Gout is painful swelling that can occur in some of your joints. Gout is a type of arthritis. This condition is caused by having too much uric acid in your body. Uric acid is a chemical that forms when your body breaks down substances called purines. Purines are important for building body proteins. When your body has too much uric acid, sharp crystals can form and build up inside your joints. This causes pain and swelling. Gout attacks can happen quickly and be very painful (acute gout). Over time, the attacks can affect more joints and become more frequent (chronic gout). Gout can also cause uric acid to build up under your skin and inside your kidneys. What are the causes? This condition is caused by too much uric acid in your blood. This can occur because:  Your kidneys do not remove enough uric acid from your blood. This is the most common cause.  Your body makes too much uric acid. This can occur with some cancers and cancer treatments. It can also occur if your body is breaking down too many red blood cells (hemolytic anemia).  You eat too many foods that are high in purines. These foods include organ meats and some seafood. Alcohol, especially beer, is also high in purines.  A gout attack may be triggered by trauma or stress. What increases the risk? This condition is more likely to develop in people who:  Have a family history of gout.  Are male and middle-aged.  Are male and have gone through menopause.  Are obese.  Frequently drink alcohol, especially beer.  Are dehydrated.  Lose weight too quickly.  Have an organ transplant.  Have lead poisoning.  Take certain medicines, including aspirin, cyclosporine, diuretics, levodopa, and niacin.  Have kidney disease or psoriasis.  What are the signs or symptoms? An attack of acute gout happens quickly. It usually occurs in just one joint. The most common place is the big toe. Attacks often start at night. Other joints  that may be affected include joints of the feet, ankle, knee, fingers, wrist, or elbow. Symptoms may include:  Severe pain.  Warmth.  Swelling.  Stiffness.  Tenderness. The affected joint may be very painful to touch.  Shiny, red, or purple skin.  Chills and fever.  Chronic gout may cause symptoms more frequently. More joints may be involved. You may also have white or yellow lumps (tophi) on your hands or feet or in other areas near your joints. How is this diagnosed? This condition is diagnosed based on your symptoms, medical history, and physical exam. You may have tests, such as:  Blood tests to measure uric acid levels.  Removal of joint fluid with a needle (aspiration) to look for uric acid crystals.  X-rays to look for joint damage.  How is this treated? Treatment for this condition has two phases: treating an acute attack and preventing future attacks. Acute gout treatment may include medicines to reduce pain and swelling, including:  NSAIDs.  Steroids. These are strong anti-inflammatory medicines that can be taken by mouth (orally) or injected into a joint.  Colchicine. This medicine relieves pain and swelling when it is taken soon after an attack. It can be given orally or through an IV tube.  Preventive treatment may include:  Daily use of smaller doses of NSAIDs or colchicine.  Use of a medicine that reduces uric acid levels in your blood.  Changes to your diet. You may need to see a specialist about healthy eating (dietitian).  Follow these instructions at home: During a Gout Attack  If directed, apply ice to the affected area: ? Put ice in a plastic bag. ? Place a towel between your skin and the bag. ? Leave the ice on for 20 minutes, 2-3 times a day.  Rest the joint as much as possible. If the affected joint is in your leg, you may be given crutches to use.  Raise (elevate) the affected joint above the level of your heart as often as  possible.  Drink enough fluids to keep your urine clear or pale yellow.  Take over-the-counter and prescription medicines only as told by your health care provider.  Do not drive or operate heavy machinery while taking prescription pain medicine.  Follow instructions from your health care provider about eating or drinking restrictions.  Return to your normal activities as told by your health care provider. Ask your health care provider what activities are safe for you. Avoiding Future Gout Attacks  Follow a low-purine diet as told by your dietitian or health care provider. Avoid foods and drinks that are high in purines, including liver, kidney, anchovies, asparagus, herring, mushrooms, mussels, and beer.  Limit alcohol intake to no more than 1 drink a day for nonpregnant women and 2 drinks a day for men. One drink equals 12 oz of beer, 5 oz of wine, or 1 oz of hard liquor.  Maintain a healthy weight or lose weight if you are overweight. If you want to lose weight, talk with your health care provider. It is important that you do not lose weight too quickly.  Start or maintain an exercise program as told by your health care provider.  Drink enough fluids to keep your urine clear or pale yellow.  Take over-the-counter and prescription medicines only as told by your health care provider.  Keep all follow-up visits as told by your health care provider. This is important. Contact a health care provider if:  You have another gout attack.  You continue to have symptoms of a gout attack after10 days of treatment.  You have side effects from your medicines.  You have chills or a fever.  You have burning pain when you urinate.  You have pain in your lower back or belly. Get help right away if:  You have severe or uncontrolled pain.  You cannot urinate. This information is not intended to replace advice given to you by your health care provider. Make sure you discuss any questions  you have with your health care provider. Document Released: 05/06/2000 Document Revised: 10/15/2015 Document Reviewed: 02/19/2015 Elsevier Interactive Patient Education  2017 [place] are compounds that affect the level of uric acid in your body. A low-purine diet is a diet that is low in purines. Eating a low-purine diet can prevent the level of uric acid in your body from getting too high and causing gout or kidney stones or both. What do I need to know about this diet?  Choose low-purine foods. Examples of low-purine foods are listed in the next section.  Drink plenty of fluids, especially water. Fluids can help remove uric acid from your body. Try to drink 8-16 cups (1.9-3.8 L) a day.  Limit foods high in fat, especially saturated fat, as fat makes it harder for the body to get rid of uric acid. Foods high in saturated fat include pizza, cheese, ice cream, whole milk, fried foods, and gravies. Choose foods that are lower in fat and lean  sources of protein. Use olive oil when cooking as it contains healthy fats that are not high in saturated fat.  Limit alcohol. Alcohol interferes with the elimination of uric acid from your body. If you are having a gout attack, avoid all alcohol.  Keep in mind that different people's bodies react differently to different foods. You will probably learn over time which foods do or do not affect you. If you discover that a food tends to cause your gout to flare up, avoid eating that food. You can more freely enjoy foods that do not cause problems. If you have any questions about a food item, talk to your dietitian or health care provider. Which foods are low, moderate, and high in purines? The following is a list of foods that are low, moderate, and high in purines. You can eat any amount of the foods that are low in purines. You may be able to have small amounts of foods that are moderate in purines. Ask your health care provider  how much of a food moderate in purines you can have. Avoid foods high in purines. Grains  Foods low in purines: Enriched white bread, pasta, rice, cake, cornbread, popcorn.  Foods moderate in purines: Whole-grain breads and cereals, wheat germ, bran, oatmeal. Uncooked oatmeal. Dry wheat bran or wheat germ.  Foods high in purines: Pancakes, [place] toast, biscuits, muffins. Vegetables  Foods low in purines: All vegetables, except those that are moderate in purines.  Foods moderate in purines: Asparagus, cauliflower, spinach, mushrooms, green peas. Fruits  All fruits are low in purines. Meats and other Protein Foods  Foods low in purines: Eggs, nuts, peanut butter.  Foods moderate in purines: 80-90% lean beef, lamb, veal, pork, poultry, fish, eggs, peanut butter, nuts. Crab, lobster, oysters, and shrimp. Cooked dried beans, peas, and lentils.  Foods high in purines: Anchovies, sardines, herring, mussels, tuna, codfish, scallops, trout, and haddock. [place] Salines. Organ meats (such as liver or kidney). Tripe. Game meat. Goose. Sweetbreads. Dairy  All dairy foods are low in purines. Low-fat and fat-free dairy products are best because they are low in saturated fat. Beverages  Drinks low in purines: Water, carbonated beverages, tea, coffee, cocoa.  Drinks moderate in purines: Soft drinks and other drinks sweetened with high-fructose corn syrup. Juices. To find whether a food or drink is sweetened with high-fructose corn syrup, look at the ingredients list.  Drinks high in purines: Alcoholic beverages (such as beer). Condiments  Foods low in purines: Salt, herbs, olives, pickles, relishes, vinegar.  Foods moderate in purines: Butter, margarine, oils, mayonnaise. Fats and Oils  Foods low in purines: All types, except gravies and sauces made with meat.  Foods high in purines: Gravies and sauces made with meat. Other Foods  Foods low in purines: Sugars, sweets, gelatin. Cake. Soups made  without meat.  Foods moderate in purines: Meat-based or fish-based soups, broths, or bouillons. Foods and drinks sweetened with high-fructose corn syrup.  Foods high in purines: High-fat desserts (such as ice cream, cookies, cakes, pies, doughnuts, and chocolate). Contact your dietitian for more information on foods that are not listed here. This information is not intended to replace advice given to you by your health care provider. Make sure you discuss any questions you have with your health care provider. Document Released: 09/03/2010 Document Revised: 10/15/2015 Document Reviewed: 04/15/2013  We will contact you with your results tomorrow.  If the uric acid is elevated, despite your improved diet over the last month, then we will likely  suggest starting a uric-acid lowering medication to take daily for prevention of gout flares.  If we do start a medication, you would want to overlap this with the colchicine for about a week, then stop (and keep the remaining colchicine on hand to use if needed for a gout flare). If your uric acid level is normal, then continue to follow the proper diet, you can stop taking the colchicine, and we will just see if you have any future flares.

## 2016-11-17 ENCOUNTER — Other Ambulatory Visit: Payer: Self-pay | Admitting: *Deleted

## 2016-11-17 DIAGNOSIS — M109 Gout, unspecified: Secondary | ICD-10-CM

## 2016-11-17 LAB — URIC ACID: URIC ACID, SERUM: 8.4 mg/dL — AB (ref 4.0–8.0)

## 2016-11-17 MED ORDER — ALLOPURINOL 300 MG PO TABS: 300.0000 mg | ORAL_TABLET | Freq: Every day | ORAL | 1 refills | Status: DC

## 2016-12-12 ENCOUNTER — Encounter: Payer: Self-pay | Admitting: Family Medicine

## 2016-12-18 ENCOUNTER — Other Ambulatory Visit: Payer: Self-pay | Admitting: Family Medicine

## 2016-12-28 ENCOUNTER — Other Ambulatory Visit: Payer: Medicare Other

## 2016-12-28 DIAGNOSIS — Z79899 Other long term (current) drug therapy: Secondary | ICD-10-CM

## 2016-12-28 DIAGNOSIS — E782 Mixed hyperlipidemia: Secondary | ICD-10-CM

## 2016-12-28 DIAGNOSIS — M109 Gout, unspecified: Secondary | ICD-10-CM

## 2016-12-29 LAB — LIPID PANEL
Cholesterol: 137 mg/dL (ref <200)
HDL: 42 mg/dL (ref >40)
LDL CALC: 70 mg/dL (ref <100)
Total CHOL/HDL Ratio: 3.3 Ratio (ref <5.0)
Triglycerides: 124 mg/dL (ref <150)
VLDL: 25 mg/dL (ref <30)

## 2016-12-29 LAB — HEPATIC FUNCTION PANEL
ALK PHOS: 59 U/L (ref 40–115)
ALT: 25 U/L (ref 9–46)
AST: 31 U/L (ref 10–35)
Albumin: 4.2 g/dL (ref 3.6–5.1)
BILIRUBIN DIRECT: 0.1 mg/dL (ref <=0.2)
BILIRUBIN INDIRECT: 0.4 mg/dL (ref 0.2–1.2)
BILIRUBIN TOTAL: 0.5 mg/dL (ref 0.2–1.2)
Total Protein: 7.7 g/dL (ref 6.1–8.1)

## 2016-12-29 LAB — URIC ACID: Uric Acid, Serum: 4.9 mg/dL (ref 4.0–8.0)

## 2017-01-10 ENCOUNTER — Other Ambulatory Visit: Payer: Self-pay | Admitting: Family Medicine

## 2017-01-20 ENCOUNTER — Other Ambulatory Visit: Payer: Self-pay | Admitting: Family Medicine

## 2017-01-20 DIAGNOSIS — M2540 Effusion, unspecified joint: Secondary | ICD-10-CM

## 2017-03-06 ENCOUNTER — Other Ambulatory Visit: Payer: Self-pay | Admitting: Family Medicine

## 2017-03-10 ENCOUNTER — Other Ambulatory Visit: Payer: Self-pay | Admitting: Family Medicine

## 2017-03-10 NOTE — Telephone Encounter (Signed)
Pt has an appt in November 

## 2017-04-02 NOTE — Progress Notes (Signed)
Chief Complaint  Patient presents with  . Hypertension    fasting med check. Needs refill on nitro.   He denies any complaints.  He reports that he was very stressed and rushed to get here today.  As he was leaving to come here, he saw a tree down, blocking his driveway.  He has been out all morning using chainsaw to move it to get to his appointment on time. His back is feeling a little stiff.  Otherwise has no complaints.  Gout:  Diagnosed in May when he presented with bilateral knee effusions.  He was started on 331m of allopurinol daily, and f/u uric acid level was normal. Lab Results  Component Value Date   LABURIC 4.9 12/28/2016  He has colchicine to use prn for any flare.  He has not had any further problems.  Hypertension follow-up: Blood pressures elsewhere are mid 120's/82 at home, very consistently sees the same #'s.  Checks it weekly.  He reports his monitor was accurate. Denies headaches, dizziness, chest pain, edema, dyspnea with exertion, side effects to medications.   Hyperlipidemia follow-up: Patient is reportedly following a low-fat, low cholesterol diet. Compliant with medications and denies medication side effects. Medication was changed from atorvastatin to rosuvastatin, and f/u lipids were improved.  He did not reach LDL goal with atorvastatin. His diet hasn't changed--He uses 1% milk, has cereal every morning. He has 2-3 eggs/week, cut back on cheese. Red meat 2x/week. Mostly lots of chicken and [place] Lab Results  Component Value Date   CHOL 137 12/28/2016   HDL 42 12/28/2016   LDLCALC 70 12/28/2016   TRIG 124 12/28/2016   CHOLHDL 3.3 12/28/2016   Lab Results  Component Value Date   ALT 25 12/28/2016   AST 31 12/28/2016   ALKPHOS 59 12/28/2016   BILITOT 0.5 12/28/2016    Impaired fasting glucose--noted 07/2013. He had mildly elevated glucose (106) with normal A1c's. He continues to follow a lower carb diet than in the past, eating how his diabetic  wife eats.  He continues to drink less beer, 1-2 beers every other day.       Lab Results  Component Value Date   HGBA1C 5.1 09/22/2016   H/o Vitamin D deficiency--level was low at 28 at his physical 02/2015. When taking 1000 IU daily, f/u level was normal in October 2017 (41). He continues to take a daily vitamin D.  Paroxysmal atrial fibrillation and coronary artery disease, status post non-ST elevation MI, with preserved left ventricular systolic function: He is on Xarelto and ASA 864m(since changed from Plavix in April). He has been maintaining sinus rhythm as far as he knows.  He denies any bleeding, bruising.  PMH, PSH, SH reviewed  Outpatient Encounter Medications as of 04/03/2017  Medication Sig  . allopurinol (ZYLOPRIM) 300 MG tablet Take 1 tablet (300 mg total) daily by mouth.  . Marland Kitchenspirin EC 81 MG tablet Take 1 tablet (81 mg total) by mouth daily.  . cholecalciferol (VITAMIN D) 1000 units tablet Take 1,000 Units by mouth daily.  . Marland KitchenOENZYME Q-10 PO Take 1 tablet by mouth daily.  . fish oil-omega-3 fatty acids 1000 MG capsule Take 4 g by mouth daily.   . metoprolol tartrate (LOPRESSOR) 25 MG tablet TAKE 1 TABLET (25 MG TOTAL) BY MOUTH 2 (TWO) TIMES DAILY.  . Marland Kitchenisc Natural Products (OSTEO BI-FLEX TRIPLE STRENGTH PO) Take 1 tablet by mouth daily.  . niacin (NIASPAN) 1000 MG CR tablet TAKE 1 TABLET (1,000 MG TOTAL)  BY MOUTH AT BEDTIME.  . rosuvastatin (CRESTOR) 40 MG tablet Take 1 tablet (40 mg total) daily by mouth.  Alveda Reasons 20 MG TABS tablet TAKE 1 TABLET (20 MG TOTAL) BY MOUTH DAILY WITH SUPPER.  . [DISCONTINUED] allopurinol (ZYLOPRIM) 300 MG tablet TAKE 1 TABLET BY MOUTH EVERY DAY  . [DISCONTINUED] metoprolol tartrate (LOPRESSOR) 25 MG tablet TAKE 1 TABLET (25 MG TOTAL) BY MOUTH 2 (TWO) TIMES DAILY.  . [DISCONTINUED] niacin (NIASPAN) 1000 MG CR tablet TAKE 1 TABLET (1,000 MG TOTAL) BY MOUTH AT BEDTIME.  . [DISCONTINUED] rosuvastatin (CRESTOR) 40 MG tablet TAKE 1 TABLET DAILY   . colchicine 0.6 MG tablet Take 1 tablet (0.6 mg total) by mouth daily. (Patient not taking: Reported on 04/03/2017)  . nitroGLYCERIN (NITROSTAT) 0.4 MG SL tablet Place 1 tablet (0.4 mg total) every 5 (five) minutes as needed under the tongue for chest pain.  . [DISCONTINUED] colchicine 0.6 MG tablet TAKE 1 TABLET BY MOUTH EVERY DAY  . [DISCONTINUED] nitroGLYCERIN (NITROSTAT) 0.4 MG SL tablet Place 1 tablet (0.4 mg total) under the tongue every 5 (five) minutes as needed for chest pain. (Patient not taking: Reported on 11/16/2016)   No facility-administered encounter medications on file as of 04/03/2017.    No Known Allergies  ROS: Denies fever, chills, URI symptoms, cough, shortness of breath, palpitations, chest pain, nausea, vomiting, diarrhea, constipation, urinary complaints, bleeding, bruises, rashes, depression, anxiety, insomnia or other complaints.  No joint pains or swelling (just slight back stiffness since cutting/moving tree out of driveway this morning). Some sniffling today, related to yardwork/allergies.    PHYSICAL EXAM:  BP (!) 150/90   Pulse 68   Ht _0  (1.778 m)   Wt 222 lb 3.2 oz (100.8 kg)   BMI 31.88 kg/m    122/80 on repeat by MD  Well developed, pleasant male in no distress HEENT: PERRL, EOMI, conjunctiva clear. OP clear--teeth with significant decay noted, no mucosal abnormalities Neck: no lymphadenopathy, thyromegaly or carotid bruit Heart: regular rate and rhythm without murmur Lungs: clear bilaterally Abdomen: soft, nontender, no mass Back: no spinal or CVA tenderness Extremities: no edema, 2+ pulses Skin: no rashes or suspicious lesions.  Psych: normal mood, affect, hygiene and grooming Neuro: alert and oriented. Normal strength, gait, cranial nerves    ASSESSMENT/PLAN:  Mixed hyperlipidemia - at goal on current regimen of crestor and niaspan. Continue lowfat diet - Plan: niacin (NIASPAN) 1000 MG CR tablet, rosuvastatin (CRESTOR) 40 MG  tablet  Gout of knee, unspecified cause, unspecified chronicity, unspecified laterality - on allopurinol with no further joint problems. - Plan: allopurinol (ZYLOPRIM) 300 MG tablet  Essential hypertension - elevated on arrival (due to stress of rushing, cutting down tree); normal on repeat. Continue current regimen - Plan: metoprolol tartrate (LOPRESSOR) 25 MG tablet  Atrial fibrillation, unspecified type ([place]) - in NSR; on oral anticoagulant without complication  Coronary artery disease involving native coronary artery of native heart without angina pectoris - stable; asymptomatic. Refilled NTG for prn use - Plan: metoprolol tartrate (LOPRESSOR) 25 MG tablet, nitroGLYCERIN (NITROSTAT) 0.4 MG SL tablet, rosuvastatin (CRESTOR) 40 MG tablet  Need for influenza vaccination - Plan: Flu vaccine HIGH DOSE PF (Fluzone High dose)   No labs today (labs from August reviewed) High dose flu shot given today 2nd dose of Shingrix due now, reminded to get from pharmacy, when available.  Reminded to schedule dental visit.  F/u CPE/AWV in May, with fasting labs prior c-met, lipids, CBC,uric acid, PSA

## 2017-04-03 ENCOUNTER — Ambulatory Visit: Payer: Medicare Other | Admitting: Family Medicine

## 2017-04-03 ENCOUNTER — Encounter: Payer: Self-pay | Admitting: Family Medicine

## 2017-04-03 VITALS — BP 122/80 | HR 68 | Ht 70.0 in | Wt 222.2 lb

## 2017-04-03 DIAGNOSIS — Z125 Encounter for screening for malignant neoplasm of prostate: Secondary | ICD-10-CM | POA: Diagnosis not present

## 2017-04-03 DIAGNOSIS — I2510 Atherosclerotic heart disease of native coronary artery without angina pectoris: Secondary | ICD-10-CM

## 2017-04-03 DIAGNOSIS — Z5181 Encounter for therapeutic drug level monitoring: Secondary | ICD-10-CM | POA: Diagnosis not present

## 2017-04-03 DIAGNOSIS — I4891 Unspecified atrial fibrillation: Secondary | ICD-10-CM

## 2017-04-03 DIAGNOSIS — Z23 Encounter for immunization: Secondary | ICD-10-CM | POA: Diagnosis not present

## 2017-04-03 DIAGNOSIS — I10 Essential (primary) hypertension: Secondary | ICD-10-CM

## 2017-04-03 DIAGNOSIS — E782 Mixed hyperlipidemia: Secondary | ICD-10-CM

## 2017-04-03 DIAGNOSIS — M109 Gout, unspecified: Secondary | ICD-10-CM | POA: Diagnosis not present

## 2017-04-03 MED ORDER — NIACIN ER (ANTIHYPERLIPIDEMIC) 1000 MG PO TBCR: EXTENDED_RELEASE_TABLET | ORAL | 5 refills | Status: DC

## 2017-04-03 MED ORDER — ROSUVASTATIN CALCIUM 40 MG PO TABS: 40.0000 mg | ORAL_TABLET | Freq: Every day | ORAL | 1 refills | Status: DC

## 2017-04-03 MED ORDER — METOPROLOL TARTRATE 25 MG PO TABS: ORAL_TABLET | ORAL | 1 refills | Status: DC

## 2017-04-03 MED ORDER — NITROGLYCERIN 0.4 MG SL SUBL: 0.4000 mg | SUBLINGUAL_TABLET | SUBLINGUAL | 1 refills | Status: DC | PRN

## 2017-04-03 MED ORDER — ALLOPURINOL 300 MG PO TABS: 300.0000 mg | ORAL_TABLET | Freq: Every day | ORAL | 1 refills | Status: DC

## 2017-04-03 NOTE — Patient Instructions (Signed)
Your blood pressure was elevated today--likely from the stress and rushing related to the tree.  Your values at home sound great.  Please continue to monitor regularly (and recheck later today).  Consider bringing your monitor to your next visit just to reassure that your monitor is still accurate.  Your are due for your next Shingles vaccine.  If the pharmacy doesn't have it available, get put on a waiting list for them to call you when it becomes available. You want to get the shots within 6 months of each other (ideally, get it 2 months apart, but up to 6 months is okay, if not available).  Please call the dentist!

## 2017-04-06 ENCOUNTER — Encounter: Payer: Self-pay | Admitting: *Deleted

## 2017-08-14 ENCOUNTER — Telehealth: Payer: Self-pay | Admitting: Family Medicine

## 2017-08-14 DIAGNOSIS — M2540 Effusion, unspecified joint: Secondary | ICD-10-CM

## 2017-08-14 MED ORDER — COLCHICINE 0.6 MG PO TABS: 0.6000 mg | ORAL_TABLET | Freq: Every day | ORAL | 0 refills | Status: DC

## 2017-08-14 NOTE — Telephone Encounter (Signed)
Ensure that he is taking his allopurinol daily to prevent gout flares (last uric acid level was great, which should keep the risk for gout flares low). Okay to refill colchicine--direction should be to take 2 at onset of flare, then once daily if needed. #30, no refill

## 2017-08-14 NOTE — Telephone Encounter (Signed)
Patient advised, he is taking the allopurinol daily. He threw out bottle by accident.

## 2017-08-14 NOTE — Telephone Encounter (Signed)
Pt called for refills of colchicine. He had a flair up this weekend and could not find any pills. Pt states he got it filled and refilled, so no refill available at pharmacy. Please send to [facility] in [place]. Pt can be reached at [phone].

## 2017-08-14 NOTE — Telephone Encounter (Signed)
Is this okay?

## 2017-08-21 ENCOUNTER — Other Ambulatory Visit: Payer: Self-pay | Admitting: Internal Medicine

## 2017-08-21 DIAGNOSIS — I480 Paroxysmal atrial fibrillation: Secondary | ICD-10-CM

## 2017-08-21 NOTE — Telephone Encounter (Signed)
Xarelto 20mg  refill request received; pt is 67 yrs old, wt-97.3kg, Crea-0.90 on 10/21/16, last seen by Dr. Lovena Le on 09/12/16, CrCl-111.66ml/min. Will send in refill to requested

## 2017-09-04 ENCOUNTER — Other Ambulatory Visit: Payer: Self-pay | Admitting: Family Medicine

## 2017-09-04 DIAGNOSIS — M2540 Effusion, unspecified joint: Secondary | ICD-10-CM

## 2017-09-12 ENCOUNTER — Encounter: Payer: Self-pay | Admitting: Internal Medicine

## 2017-09-22 ENCOUNTER — Ambulatory Visit: Payer: Medicare Other | Admitting: Internal Medicine

## 2017-09-22 ENCOUNTER — Encounter: Payer: Self-pay | Admitting: Internal Medicine

## 2017-09-22 ENCOUNTER — Other Ambulatory Visit: Payer: Self-pay | Admitting: Family Medicine

## 2017-09-22 VITALS — BP 140/80 | HR 71 | Ht 70.0 in | Wt 218.8 lb

## 2017-09-22 DIAGNOSIS — I2510 Atherosclerotic heart disease of native coronary artery without angina pectoris: Secondary | ICD-10-CM

## 2017-09-22 DIAGNOSIS — I10 Essential (primary) hypertension: Secondary | ICD-10-CM | POA: Diagnosis not present

## 2017-09-22 DIAGNOSIS — I480 Paroxysmal atrial fibrillation: Secondary | ICD-10-CM

## 2017-09-22 DIAGNOSIS — M109 Gout, unspecified: Secondary | ICD-10-CM

## 2017-09-22 DIAGNOSIS — E782 Mixed hyperlipidemia: Secondary | ICD-10-CM

## 2017-09-22 NOTE — Patient Instructions (Signed)

## 2017-09-22 NOTE — Progress Notes (Signed)
HPI [name] returns today for follow-up. He is a very pleasant 67 year old man with paroxysmal atrial fibrillation and coronary artery disease, status post non-ST elevation MI, with preserved left ventricular systolic function. In the interim, he has no anginal symptoms, and he has had minimal if any palpitations. He has not had syncope. He denies peripheral edema. He remains active, working on a farm.  No Known Allergies   Current Outpatient Medications  Medication Sig Dispense Refill  . allopurinol (ZYLOPRIM) 300 MG tablet Take 1 tablet (300 mg total) daily by mouth. 90 tablet 1  . aspirin EC 81 MG tablet Take 1 tablet (81 mg total) by mouth daily.    . cholecalciferol (VITAMIN D) 1000 units tablet Take 1,000 Units by mouth daily.    Marland Kitchen COENZYME Q-10 PO Take 1 tablet by mouth daily.    . colchicine 0.6 MG tablet Take 1 tablet (0.6 mg total) by mouth daily. 30 tablet 0  . fish oil-omega-3 fatty acids 1000 MG capsule Take 4 g by mouth daily.     . metoprolol tartrate (LOPRESSOR) 25 MG tablet TAKE 1 TABLET (25 MG TOTAL) BY MOUTH 2 (TWO) TIMES DAILY. 180 tablet 1  . Misc Natural Products (OSTEO BI-FLEX TRIPLE STRENGTH PO) Take 1 tablet by mouth daily.    . niacin (NIASPAN) 1000 MG CR tablet TAKE 1 TABLET (1,000 MG TOTAL) BY MOUTH AT BEDTIME. 30 tablet 5  . nitroGLYCERIN (NITROSTAT) 0.4 MG SL tablet Place 1 tablet (0.4 mg total) every 5 (five) minutes as needed under the tongue for chest pain. 28 tablet 1  . rosuvastatin (CRESTOR) 40 MG tablet Take 1 tablet (40 mg total) daily by mouth. 90 tablet 1  . XARELTO 20 MG TABS tablet TAKE 1 TABLET (20 MG TOTAL) BY MOUTH DAILY WITH SUPPER. 30 tablet 5   No current facility-administered medications for this visit.      Past Medical History:  Diagnosis Date  . Arthritis   . Atrial fibrillation with RVR ([place])    converted to NSR with IV dilt 10/12 - no coumadin due to need for Plavix and ASA  . Cataract   . Clotting disorder ([place])    PE  2012  . Colon polyps  10/2015   (non-adenomatous)  . Coronary artery disease    LHC 03/17/11: Dx 30%, OM1 30%, OM2 30%, pRCA 30%, EF 40-45% with anterolat and apical HK  . Diverticulosis  10/2015   noted on colonoscopy  . Elevated LFTs   . Fatty liver   . Hematochezia    LIKEY FROM HEMORRHOIDAL BLEEDING; PT HAS H/O  . Hyperlipidemia   . Hypertension   . Internal hemorrhoids  10/2015   noted on colonoscopy  . NSTEMI (non-ST elevated myocardial infarction) ([place])    2012-FLET THAT NON-ST-ELEVATION MI IS POSSIBLY SECONDARY TO CORONARY EMBOLUS FROM HIS AFIB  . Syncope    in setting of AFib with RVR    ROS:   All systems reviewed and negative except as noted in the HPI.   Past Surgical History:  Procedure Laterality Date  . CARDIAC CATHETERIZATION  03/18/11  . cardiac event monitor  04/01/11  . CATARACT EXTRACTION W/PHACO Left 09/18/2014   Procedure: CATARACT EXTRACTION PHACO AND INTRAOCULAR LENS PLACEMENT LEFT EYE;  Surgeon: Tonny Branch, MD;  Location: [facility];  Service: Ophthalmology;  Laterality: Left;  CDE 13.32  . TRANSTHORACIC ECHOCARDIOGRAM  03/19/11     Family History  Problem Relation Age of Onset  .  Hyperlipidemia Brother   . Hypertension Brother   . Arthritis Mother   . Heart disease Neg Hx   . Cancer Neg Hx   . Diabetes Neg Hx   . Colon cancer Neg Hx   . Esophageal cancer Neg Hx   . Rectal cancer Neg Hx   . Stomach cancer Neg Hx      Social History   Socioeconomic History  . Marital status: Married    Spouse name: Not on file  . Number of children: 0  . Years of education: Not on file  . Highest education level: Not on file  Occupational History  . Occupation: [employer] job 02/2013    Employer: [employer]  . Financial resource strain: Not on file  . Food insecurity:    Worry: Not on file    Inability: Not on file  . Transportation needs:    Medical: Not on file    Non-medical: Not on file  Tobacco Use  . Smoking  status: Former Smoker    Packs/day: 0.50    Years: 3.00    Pack years: 1.50    Types: Cigarettes    Last attempt to quit: 11/24/1974    Years since quitting: 42.8  . Smokeless tobacco: Never Used  Substance and Sexual Activity  . Alcohol use: Yes    Alcohol/week: 0.0 oz    Comment: 1-2 beers every other day, about 6/week  . Drug use: No  . Sexual activity: Yes    Partners: Female  Lifestyle  . Physical activity:    Days per week: Not on file    Minutes per session: Not on file  . Stress: Not on file  Relationships  . Social connections:    Talks on phone: Not on file    Gets together: Not on file    Attends religious service: Not on file    Active member of club or organization: Not on file    Attends meetings of clubs or organizations: Not on file    Relationship status: Not on file  . Intimate partner violence:    Fear of current or ex partner: Not on file    Emotionally abused: Not on file    Physically abused: Not on file    Forced sexual activity: Not on file  Other Topics Concern  . Not on file  Social History Narrative   Lives with wife, 1 dog.  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)     BP 140/80 (BP Location: Right Arm, Patient Position: Sitting, Cuff Size: Normal)   Pulse 71   Ht 5\' 10"  (1.778 m)   Wt 218 lb 12.8 oz (99.2 kg)   SpO2 97%   BMI 31.39 kg/m   Physical Exam:  Well appearing 67 yo man, NAD HEENT: Unremarkable Neck:  6cm JVD, no thyromegally Lymphatics:  No adenopathy Back:  No CVA tenderness Lungs:  Clear with no wheezes HEART:  Regular rate rhythm, no murmurs, no rubs, no clicks Abd:  soft, positive bowel sounds, no organomegally, no rebound, no guarding Ext:  2 plus pulses, no edema, no cyanosis, no clubbing Skin:  No rashes no nodules Neuro:  CN II through XII intact, motor grossly intact  EKG - NSR  DEVICE  Normal device function.  See PaceArt for details.   Assess/Plan: 1. PAF - he is  maintaining NSR. He will continue his current meds. 2. HTN -  his blood pressure is high in the office today but he notes that at home it is well controlled. 3. CAD - he denies anginal symptoms. He is limited in his mobility by gout in his knee. 4. Dyslipidemia - he denies muscle aches or problems from his statin.  Mikle Bosworth.D.

## 2017-10-26 ENCOUNTER — Other Ambulatory Visit: Payer: Self-pay | Admitting: *Deleted

## 2017-10-26 ENCOUNTER — Other Ambulatory Visit: Payer: Medicare Other

## 2017-10-26 DIAGNOSIS — Z125 Encounter for screening for malignant neoplasm of prostate: Secondary | ICD-10-CM

## 2017-10-26 DIAGNOSIS — M109 Gout, unspecified: Secondary | ICD-10-CM

## 2017-10-26 DIAGNOSIS — E782 Mixed hyperlipidemia: Secondary | ICD-10-CM

## 2017-10-26 DIAGNOSIS — I10 Essential (primary) hypertension: Secondary | ICD-10-CM

## 2017-10-26 DIAGNOSIS — Z5181 Encounter for therapeutic drug level monitoring: Secondary | ICD-10-CM

## 2017-10-26 DIAGNOSIS — I2510 Atherosclerotic heart disease of native coronary artery without angina pectoris: Secondary | ICD-10-CM

## 2017-10-26 MED ORDER — METOPROLOL TARTRATE 25 MG PO TABS: ORAL_TABLET | ORAL | 0 refills | Status: DC

## 2017-10-26 NOTE — Addendum Note (Signed)
Addended by: Elyse Jarvis on: 10/26/2017 08:07 AM   Modules accepted: Orders

## 2017-10-27 LAB — COMPREHENSIVE METABOLIC PANEL
ALBUMIN: 4.5 g/dL (ref 3.6–4.8)
ALT: 20 IU/L (ref 0–44)
AST: 30 IU/L (ref 0–40)
Albumin/Globulin Ratio: 1.3 (ref 1.2–2.2)
Alkaline Phosphatase: 58 IU/L (ref 39–117)
BUN / CREAT RATIO: 15 (ref 10–24)
BUN: 15 mg/dL (ref 8–27)
Bilirubin Total: 0.4 mg/dL (ref 0.0–1.2)
CALCIUM: 9.6 mg/dL (ref 8.6–10.2)
CHLORIDE: 104 mmol/L (ref 96–106)
CO2: 21 mmol/L (ref 20–29)
CREATININE: 1.03 mg/dL (ref 0.76–1.27)
GFR calc Af Amer: 87 mL/min/{1.73_m2} (ref >59)
GFR, EST NON AFRICAN AMERICAN: 75 mL/min/{1.73_m2} (ref >59)
GLUCOSE: 91 mg/dL (ref 65–99)
Globulin, Total: 3.5 g/dL (ref 1.5–4.5)
Potassium: 4.7 mmol/L (ref 3.5–5.2)
Sodium: 142 mmol/L (ref 134–144)
TOTAL PROTEIN: 8 g/dL (ref 6.0–8.5)

## 2017-10-27 LAB — CBC WITH DIFFERENTIAL/PLATELET
BASOS ABS: 0 10*3/uL (ref 0.0–0.2)
BASOS: 1 %
EOS (ABSOLUTE): 0.4 10*3/uL (ref 0.0–0.4)
Eos: 6 %
Hematocrit: 34.1 % — ABNORMAL LOW (ref 37.5–51.0)
Hemoglobin: 10.4 g/dL — ABNORMAL LOW (ref 13.0–17.7)
IMMATURE GRANS (ABS): 0 10*3/uL (ref 0.0–0.1)
Immature Granulocytes: 0 %
LYMPHS: 38 %
Lymphocytes Absolute: 2.6 10*3/uL (ref 0.7–3.1)
MCH: 22.7 pg — AB (ref 26.6–33.0)
MCHC: 30.5 g/dL — ABNORMAL LOW (ref 31.5–35.7)
MCV: 75 fL — AB (ref 79–97)
MONOS ABS: 0.7 10*3/uL (ref 0.1–0.9)
Monocytes: 10 %
NEUTROS ABS: 3.1 10*3/uL (ref 1.4–7.0)
NEUTROS PCT: 45 %
Platelets: 288 10*3/uL (ref 150–450)
RBC: 4.58 x10E6/uL (ref 4.14–5.80)
RDW: 16.6 % — AB (ref 12.3–15.4)
WBC: 6.8 10*3/uL (ref 3.4–10.8)

## 2017-10-27 LAB — PSA: Prostate Specific Ag, Serum: 0.6 ng/mL (ref 0.0–4.0)

## 2017-10-27 LAB — LIPID PANEL
CHOLESTEROL TOTAL: 126 mg/dL (ref 100–199)
Chol/HDL Ratio: 2.4 ratio (ref 0.0–5.0)
HDL: 52 mg/dL (ref >39)
LDL Calculated: 51 mg/dL (ref 0–99)
Triglycerides: 115 mg/dL (ref 0–149)
VLDL Cholesterol Cal: 23 mg/dL (ref 5–40)

## 2017-10-27 LAB — URIC ACID: Uric Acid: 3.8 mg/dL (ref 3.7–8.6)

## 2017-10-29 NOTE — Progress Notes (Signed)
Chief Complaint  Patient presents with  . cpe    cpe, had labs done last friday. gout flare up. not sleeping good.     Timothy Estes is a 67 y.o. male who presents for annual physical exam, Medicare wellness visit and follow-up on chronic medical conditions.  He has the following concerns:  Left knee pain.  Gout:  Diagnosed in May, 2018 when he presented with bilateral knee effusions. He was started on 300mg  of allopurinol daily, and f/u uric acid level was normal. He has colchicine to use prn for any flare. He has had some flaring over the past month (called to get colchicine refill last month), but it got much worse over this past weekend. He was not in pain when he came for labs last week. Left knee has been swollen and painful over the weekend.  He had cut back on his beer in the past (1-2 qod at last visit), but is back to 3-4 beers/day.  He has also been having cramping in his left thigh. He was taking colchicine over the last month (2 on the first day, then 1 daily until he ran out last week).  Only had diarrhea the first day (when he took 2 colchicine). Also had some diarrhea today, small amount. He describes severe discomfort if he tries to bear weight on the knee, is tolerable when seated.  He no longer has pain medications.  Hypertension follow-up: Blood pressures elsewhere are mid 120's/80'sat home, checks it a few times/month. He reports his monitor was accurate. Denies headaches, dizziness, chest pain, edema, dyspnea with exertion, side effects to medications.   Hyperlipidemia follow-up: Patient is reportedly following a low-fat, low cholesterol diet (other than his beer intake as above). Compliant with medications and denies medication side effects. Medication was changed from atorvastatin to rosuvastatin, and f/u lipids were improved. He did not reach LDL goal with atorvastatin. His diet hasn't changed--He uses 1% milk, has cereal every morning. He has 2-3 eggs/week, cut back on  cheese. Red meat 2x/week. Mostly lots of chicken and [place].  He had labs prior to visit, see below.  Impaired fasting glucose--noted 07/2013. He had mildly elevated glucose (106)in the past, with normal A1c's. He continues to follow a lower carb diet than in the past, eating how his diabetic wife eats.    H/o Vitamin D deficiency--level was low at 28 at his physical 02/2015.When taking 1000 IU daily, f/u level was normal in October 2017 (41). He continues to take a daily vitamin D.  Paroxysmal atrial fibrillation and coronary artery disease, status post non-ST elevation MI, with preserved left ventricular systolic function: He is on Xarelto and ASA 81mg . He has been maintaining sinus rhythm as far as he knows.  He denies any bleeding (just mild hemorrhoidal bleeding), bruising. He last saw Dr. Lovena Le in 09/2017 and no changes were made.   Immunization History  Administered Date(s) Administered  . Influenza Split 03/15/2012  . Influenza, High Dose Seasonal PF 04/03/2017  . Influenza,inj,Quad PF,6+ Mos 01/23/2013, 01/22/2014, 02/24/2015, 03/10/2016  . Pneumococcal Conjugate-13 09/22/2016  . Tdap 07/10/2012  . Zoster 07/10/2012  . Zoster Recombinat (Shingrix) 12/09/2016, 04/04/2017   Last colonoscopy:10/2015 Dr. Fuller Plan (benign polyps, repeat 10 years) Last PSA:  10/2017 0.6 Lab Results  Component Value Date   PSA 0.5 09/22/2016   PSA 0.54 09/03/2015   PSA 0.44 07/24/2014   Dentist: over 8 years ago; has been speaking with a dentist, may need to have teeth pulled and get dentures/bridges.  Ophtho: yearly Exercise: Walks up and down driveway 2-3 times KXFGH--82-99 mins,3-4 times/week.  Working around the house. Yardwork, cutting wood.   Other doctors caring for patient include: GI: Dr. Fuller Plan Cardiology: Dr. Lovena Le Dentist: Dr. Lynnette Caffey Ophtho: Dr. Rona Ravens ([facility] in [place])--Dr. Patsey Berthold (did cataract surgery)   Depression screen: Negative Fall screen: one, no injury  (tripped over "his own feet" in his living room) Functional status screen: notable for trouble walking due to gout flare Mini-Cog screen:  4/5 (missed 1 recall) See full questionnaires in Epic  End of Life Discussion:  Patient does not have a living will and medical power of attorney. Wife is working on it now.  Past Medical History:  Diagnosis Date  . Arthritis   . Atrial fibrillation with RVR ([place])    converted to NSR with IV dilt 10/12 - no coumadin due to need for Plavix and ASA  . Cataract   . Clotting disorder ([place])    PE 2012  . Colon polyps  10/2015   (non-adenomatous)  . Coronary artery disease    LHC 03/17/11: Dx 30%, OM1 30%, OM2 30%, pRCA 30%, EF 40-45% with anterolat and apical HK  . Diverticulosis  10/2015   noted on colonoscopy  . Elevated LFTs   . Fatty liver   . Hematochezia    LIKEY FROM HEMORRHOIDAL BLEEDING; PT HAS H/O  . Hyperlipidemia   . Hypertension   . Internal hemorrhoids  10/2015   noted on colonoscopy  . NSTEMI (non-ST elevated myocardial infarction) ([place])    2012-FLET THAT NON-ST-ELEVATION MI IS POSSIBLY SECONDARY TO CORONARY EMBOLUS FROM HIS AFIB  . Syncope    in setting of AFib with RVR    Past Surgical History:  Procedure Laterality Date  . CARDIAC CATHETERIZATION  03/18/11  . cardiac event monitor  04/01/11  . CATARACT EXTRACTION W/PHACO Left 09/18/2014   Procedure: CATARACT EXTRACTION PHACO AND INTRAOCULAR LENS PLACEMENT LEFT EYE;  Surgeon: Tonny Branch, MD;  Location: [facility];  Service: Ophthalmology;  Laterality: Left;  CDE 13.32  . TRANSTHORACIC ECHOCARDIOGRAM  03/19/11    Social History   Socioeconomic History  . Marital status: Married    Spouse name: Not on file  . Number of children: 0  . Years of education: Not on file  . Highest education level: Not on file  Occupational History  . Occupation: [employer] job 02/2013    Employer: [employer]  . Financial resource strain: Not on file  . Food  insecurity:    Worry: Not on file    Inability: Not on file  . Transportation needs:    Medical: Not on file    Non-medical: Not on file  Tobacco Use  . Smoking status: Former Smoker    Packs/day: 0.50    Years: 3.00    Pack years: 1.50    Types: Cigarettes    Last attempt to quit: 11/24/1974    Years since quitting: 42.9  . Smokeless tobacco: Never Used  Substance and Sexual Activity  . Alcohol use: Yes    Alcohol/week: 0.0 oz    Comment: 1-2 beers every other day, about 6/week  . Drug use: No  . Sexual activity: Yes    Partners: Female  Lifestyle  . Physical activity:    Days per week: Not on file    Minutes per session: Not on file  . Stress: Not on file  Relationships  . Social connections:  Talks on phone: Not on file    Gets together: Not on file    Attends religious service: Not on file    Active member of club or organization: Not on file    Attends meetings of clubs or organizations: Not on file    Relationship status: Not on file  . Intimate partner violence:    Fear of current or ex partner: Not on file    Emotionally abused: Not on file    Physically abused: Not on file    Forced sexual activity: Not on file  Other Topics Concern  . Not on file  Social History Narrative   Lives with wife, 1 dog.  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 Hyperlipidemia Brother   . Hypertension Brother   . Arthritis Mother   . Heart disease Neg Hx   . Cancer Neg Hx   . Diabetes Neg Hx   . Colon cancer Neg Hx   . Esophageal cancer Neg Hx   . Rectal cancer Neg Hx   . Stomach cancer Neg Hx     Outpatient Encounter Medications as of 10/30/2017  Medication Sig  . allopurinol (ZYLOPRIM) 300 MG tablet TAKE 1 TABLET (300 MG TOTAL) DAILY BY MOUTH.  Marland Kitchen aspirin EC 81 MG tablet Take 1 tablet (81 mg total) by mouth daily.  . cholecalciferol (VITAMIN D) 1000 units tablet Take 1,000 Units by  mouth daily.  Marland Kitchen COENZYME Q-10 PO Take 1 tablet by mouth daily.  . colchicine 0.6 MG tablet Take 1 tablet (0.6 mg total) by mouth daily.  . fish oil-omega-3 fatty acids 1000 MG capsule Take 4 g by mouth daily.   . metoprolol tartrate (LOPRESSOR) 25 MG tablet TAKE 1 TABLET (25 MG TOTAL) BY MOUTH 2 (TWO) TIMES DAILY.  Marland Kitchen Misc Natural Products (OSTEO BI-FLEX TRIPLE STRENGTH PO) Take 1 tablet by mouth daily.  . niacin (NIASPAN) 1000 MG CR tablet TAKE 1 TABLET (1,000 MG TOTAL) BY MOUTH AT BEDTIME.  . nitroGLYCERIN (NITROSTAT) 0.4 MG SL tablet Place 1 tablet (0.4 mg total) every 5 (five) minutes as needed under the tongue for chest pain.  . rosuvastatin (CRESTOR) 40 MG tablet TAKE 1 TABLET (40 MG TOTAL) DAILY BY MOUTH.  Alveda Reasons 20 MG TABS tablet TAKE 1 TABLET (20 MG TOTAL) BY MOUTH DAILY WITH SUPPER.  . [DISCONTINUED] colchicine 0.6 MG tablet Take 1 tablet (0.6 mg total) by mouth daily.  . [DISCONTINUED] niacin (NIASPAN) 1000 MG CR tablet TAKE 1 TABLET (1,000 MG TOTAL) BY MOUTH AT BEDTIME.  . methylPREDNISolone (MEDROL DOSEPAK) 4 MG TBPK tablet Take as directed  . traMADol (ULTRAM) 50 MG tablet Take 1 tablet (50 mg total) by mouth every 8 (eight) hours as needed for moderate pain or severe pain.   No facility-administered encounter medications on file as of 10/30/2017.    (not taking steroids or ultram prior to visit, and had run out of colchicine).  No Known Allergies   ROS: The patient denies anorexia, fever, headaches, weight changes, decreased hearing, ear pain, hoarseness, chest pain, palpitations, dizziness, syncope, dyspnea on exertion, cough, swelling, nausea, vomiting, diarrhea (just today and when he first started the colchicine with recent gout flare), constipation, abdominal pain, melena, hematochezia, indigestion/heartburn, hematuria, incontinence, erectile dysfunction, nocturia, weakened urine stream, dysuria, genital lesions, numbness, tingling, weakness, tremor, suspicious skin  lesions, depression, anxiety, abnormal bleeding/bruising, or enlarged lymph  nodes. Denies memory issues. +snores (per wife). Feels well-rested in the mornings. Denies daytime somnolence. Wife denies apnea. (reviewed--denies any change) Occasional runny nose related to allergies, not currently. Occasional left knee pain--flaring today No heartburn recently (wife reports he was eating tums regularly for a while; pt states that got better, denies abdominal pain). Loose stool today, overall bowels are normal.  Gets up 2-3 times/night to void (small amounts); no dysuria. Mild hemorrhoidal bleeding intermittently, per HPI Trouble sleeping over the last month, which he relates to the knee pain.    PHYSICAL EXAM:  BP 124/80   Pulse 88   Temp 98.2 F (36.8 C) (Oral)   Ht 5\' 10"  (1.778 m) Comment: can't stand due to gout flare up  Wt 219 lb (99.3 kg) Comment: can't stand due to gout flare  BMI 31.42 kg/m   Wt Readings from Last 3 Encounters:  10/30/17 219 lb (99.3 kg)  09/22/17 218 lb 12.8 oz (99.2 kg)  04/03/17 222 lb 3.2 oz (100.8 kg)    General Appearance:  Alert, cooperative, no distress, appears older than stated age. He stays seated on seated walker, rolling it back and forth periodically, due to discomfort in knee (keeping him from sitting still)  Head:  Normocephalic, without obvious abnormality, atraumatic   Eyes:  PERRL, conjunctiva/corneas clear, EOM's intact, fundi benign   Ears:  Normal TM's and external ear canals   Nose:  Nares normal, mucosa is mildly edematous, no purulence; no sinus tenderness   Throat:  Lips, mucosa, and tongue normal; significant tooth decay noted (diffuse, but especially upper left front teeth)   Neck:  Supple, no lymphadenopathy; thyroid: no enlargement/ tenderness/nodules; no carotid bruit or JVD   Back:  Spine nontender, no curvature, ROM normal, no CVA tenderness   Lungs:  Clear to auscultation bilaterally without  wheezes, rales or ronchi; respirations unlabored.   Chest Wall:  No tenderness or deformity   Heart:  Regular rate and rhythm, S1 and S2 normal, no murmur, rub or gallop   Breast Exam:  No chest wall tenderness, masses or gynecomastia   Abdomen:  Soft, non-tender, nondistended, normoactive bowel sounds,  no masses, no hepatosplenomegaly   Genitalia:  Deferred per pt request (needed to remain seated due to knee pain)   Rectal:  Deferred per pt request (unable to bear weight on knee, and also due to having had diarrhea earlier; preferred to defer exam to f/u appt)  Extremities:  No clubbing, cyanosis or edema.Left knee-- + effusion, warmth, decreased ROM, tender. No erythema.  Pulses:  2+ and symmetric all extremities   Skin:  Skin color, texture, turgor normal, no rashes or lesions.  Lymph nodes:  Cervical, supraclavicular, and axillary nodes normal   Neurologic:  CNII-XII intact, normal strength, sensation and gait; reflexes 2+ and symmetric throughout    Psych:  Normal mood, affect, hygiene and grooming     Chemistry      Component Value Date/Time   NA 142 10/26/2017 0812   K 4.7 10/26/2017 0812   CL 104 10/26/2017 0812   CO2 21 10/26/2017 0812   BUN 15 10/26/2017 0812   CREATININE 1.03 10/26/2017 0812   CREATININE 1.05 09/03/2015 0001      Component Value Date/Time   CALCIUM 9.6 10/26/2017 0812   ALKPHOS 58 10/26/2017 0812   AST 30 10/26/2017 0812   ALT 20 10/26/2017 0812   BILITOT 0.4 10/26/2017 0812     Fasting glucose 91  Lab Results  Component Value Date  LABURIC 3.8 10/26/2017    PSA 0.6  Lab Results  Component Value Date   WBC 6.8 10/26/2017   HGB 10.4 (L) 10/26/2017   HCT 34.1 (L) 10/26/2017   MCV 75 (L) 10/26/2017   PLT 288 10/26/2017   Lab Results  Component Value Date   CHOL 126 10/26/2017   HDL 52 10/26/2017   LDLCALC 51 10/26/2017   TRIG 115 10/26/2017   CHOLHDL 2.4 10/26/2017      ASSESSMENT/PLAN:  Annual physical exam - Plan: POCT Urinalysis DIP (Proadvantage Device)  Medicare annual wellness visit, initial  Mixed hyperlipidemia - lipids at goal on current regimen - Plan: niacin (NIASPAN) 1000 MG CR tablet  Gout of knee, unspecified cause, unspecified chronicity, unspecified laterality - recent uric acid normal (prior to exacerbation of pain/swelling); continue allopurinol  Essential hypertension - controlled  Atrial fibrillation, unspecified type (H[facility])  Coronary artery disease involving native coronary artery of native heart without angina pectoris - stable, asymptomatic  Vitamin D deficiency - continue daily supplements  Immunization due - risks/side effects of pneumovax reviewed - Plan: Pneumococcal polysaccharide vaccine 23-valent greater than or equal to 2yo subcutaneous/IM  Mixed hyperlipidemia - at goal on current regimen of crestor and niaspan. Continue lowfat diet - Plan: niacin (NIASPAN) 1000 MG CR tablet  Joint effusion - Plan: colchicine 0.6 MG tablet  Acute gout of left knee, unspecified cause - suspect related to dietary change (increased beer intake); treat with steroids, tramadol at night; refilled colchicine for prn use in future. SE/risks reviewed - Plan: methylPREDNISolone (MEDROL DOSEPAK) 4 MG TBPK tablet, traMADol (ULTRAM) 50 MG tablet, colchicine 0.6 MG tablet  Anemia, unspecified type - new since last check; on anticoagulant. no bleeding other than mild hemorrhoidal. suspect IDA due to indices--add on Fe and ferritin levels   Add on iron, ferritin to labs done Friday.   Discussed PSA screening (risks/benefits), recommended at least 30 minutes of aerobic activity at least 5 days/week; proper sunscreen use reviewed; healthy diet and alcohol recommendations (less than or equal to 2 drinks/day) reviewed; regular seatbelt use; changing batteries in smoke detectors. Self-testicular exams. Immunization recommendations  discussed--continue yearly flu shots. Pneumovax given today. Colonoscopy recommendations reviewed--UTD.  Risks of obesity reviewed. Weight loss encouraged. Counseled re: diet, exercise, alcohol.  Strongly encouraged to schedule routine dental appointment. Has significant decay, which can affect his overall health.  He has been speaking with dentist (hasn't actually seen yet), encouraged him to f/u.  MOST form reviewed/updated/signed, Full Code, Full Care Encouraged to complete the living will and healthcare POA paperwork, and provide [place] with completed copies.    Please cut back on alcohol intake--this is the trigger for gout. (You did much better when having 1-2 beers every other day, rather than your current quantity). Take the steroid pack as directed. You may also use tylenol and/or tramadol for more severe pain. (use mostly just at night, can cause sedation). I will refill the colchicine to have on hand for flares in the future. Prevention is key.  F/u 1-2 weeks if not better, otheriwse in 6 months F/u 1 month on gout and anemia  Medicare Attestation I have personally reviewed: The patient's medical and social history Their use of alcohol, tobacco or illicit drugs Their current medications and supplements The patient's functional ability including ADLs,fall risks, home safety risks, cognitive, and hearing and visual impairment Diet and physical activities Evidence for depression or mood disorders  The patient's weight, height and BMI have been recorded in the chart.  I have made referrals, counseling, and provided education to the patient based on review of the above and I have provided the patient with a written personalized care plan for preventive services.

## 2017-10-30 ENCOUNTER — Ambulatory Visit: Payer: Medicare Other | Admitting: Family Medicine

## 2017-10-30 ENCOUNTER — Encounter: Payer: Self-pay | Admitting: Family Medicine

## 2017-10-30 VITALS — BP 124/80 | HR 88 | Temp 98.2°F | Ht 70.0 in | Wt 219.0 lb

## 2017-10-30 DIAGNOSIS — E782 Mixed hyperlipidemia: Secondary | ICD-10-CM

## 2017-10-30 DIAGNOSIS — I10 Essential (primary) hypertension: Secondary | ICD-10-CM | POA: Diagnosis not present

## 2017-10-30 DIAGNOSIS — I2510 Atherosclerotic heart disease of native coronary artery without angina pectoris: Secondary | ICD-10-CM

## 2017-10-30 DIAGNOSIS — D649 Anemia, unspecified: Secondary | ICD-10-CM

## 2017-10-30 DIAGNOSIS — M109 Gout, unspecified: Secondary | ICD-10-CM | POA: Diagnosis not present

## 2017-10-30 DIAGNOSIS — Z23 Encounter for immunization: Secondary | ICD-10-CM | POA: Diagnosis not present

## 2017-10-30 DIAGNOSIS — Z0000 Encounter for general adult medical examination without abnormal findings: Secondary | ICD-10-CM | POA: Diagnosis not present

## 2017-10-30 DIAGNOSIS — I4891 Unspecified atrial fibrillation: Secondary | ICD-10-CM | POA: Diagnosis not present

## 2017-10-30 DIAGNOSIS — M2540 Effusion, unspecified joint: Secondary | ICD-10-CM | POA: Diagnosis not present

## 2017-10-30 DIAGNOSIS — E559 Vitamin D deficiency, unspecified: Secondary | ICD-10-CM

## 2017-10-30 LAB — POCT URINALYSIS DIP (PROADVANTAGE DEVICE)
BILIRUBIN UA: NEGATIVE
GLUCOSE UA: NEGATIVE mg/dL
Ketones, POC UA: NEGATIVE mg/dL
LEUKOCYTES UA: NEGATIVE
NITRITE UA: NEGATIVE
Protein Ur, POC: NEGATIVE mg/dL
RBC UA: NEGATIVE
Specific Gravity, Urine: 1.02
Urobilinogen, Ur: NEGATIVE
pH, UA: 6 (ref 5.0–8.0)

## 2017-10-30 MED ORDER — METHYLPREDNISOLONE 4 MG PO TBPK: ORAL_TABLET | ORAL | 0 refills | Status: DC

## 2017-10-30 MED ORDER — COLCHICINE 0.6 MG PO TABS: 0.6000 mg | ORAL_TABLET | Freq: Every day | ORAL | 0 refills | Status: DC

## 2017-10-30 MED ORDER — TRAMADOL HCL 50 MG PO TABS: 50.0000 mg | ORAL_TABLET | Freq: Three times a day (TID) | ORAL | 0 refills | Status: DC | PRN

## 2017-10-30 MED ORDER — NIACIN ER (ANTIHYPERLIPIDEMIC) 1000 MG PO TBCR: EXTENDED_RELEASE_TABLET | ORAL | 5 refills | Status: DC

## 2017-10-30 NOTE — Patient Instructions (Addendum)
  HEALTH MAINTENANCE RECOMMENDATIONS:  It is recommended that you get at least 30 minutes of aerobic exercise at least 5 days/week (for weight loss, you may need as much as 60-90 minutes). This can be any activity that gets your heart rate up. This can be divided in 10-15 minute intervals if needed, but try and build up your endurance at least once a week.  Weight bearing exercise is also recommended twice weekly.  Eat a healthy diet with lots of vegetables, fruits and fiber.  "Colorful" foods have a lot of vitamins (ie green vegetables, tomatoes, red peppers, etc).  Limit sweet tea, regular sodas and alcoholic beverages, all of which has a lot of calories and sugar.  Up to 2 alcoholic drinks daily may be beneficial for men (unless trying to lose weight, watch sugars).  Drink a lot of water.  Sunscreen of at least SPF 30 should be used on all sun-exposed parts of the skin when outside between the hours of 10 am and 4 pm (not just when at beach or pool, but even with exercise, golf, tennis, and yard work!)  Use a sunscreen that says "broad spectrum" so it covers both UVA and UVB rays, and make sure to reapply every 1-2 hours.  Remember to change the batteries in your smoke detectors when changing your clock times in the spring and fall. I recommend a carbon monoxide detector for your home.  Use your seat belt every time you are in a car, and please drive safely and not be distracted with cell phones and texting while driving.  Please cut back on alcohol intake--this is the trigger for gout. (You did much better when having 1-2 beers every other day, rather than your current quantity). Take the steroid pack as directed. You may also use tylenol and/or tramadol for more severe pain. (use mostly just at night, can cause sedation). I will refill the colchicine to have on hand for flares in the future. Prevention is key.   Timothy Estes , Thank you for taking time to come for your Medicare Wellness Visit. I  appreciate your ongoing commitment to your health goals. Please review the following plan we discussed and let me know if I can assist you in the future.   These are the goals we discussed: Goals    None      This is a list of the screening recommended for you and due dates:  Health Maintenance  Topic Date Due  . Pneumonia vaccines (2 of 2 - PPSV23) 09/22/2017  . Flu Shot  12/21/2017  . Tetanus Vaccine  07/10/2022  . Colon Cancer Screening  11/11/2025  .  Hepatitis C: One time screening is recommended by Center for Disease Control  (CDC) for  adults born from [birth year] through 1964.   Completed   You got the pneumovax today (second pneumonia shot).  Follow up with the dentist as planned.

## 2017-10-31 ENCOUNTER — Encounter: Payer: Self-pay | Admitting: Family Medicine

## 2017-11-01 LAB — SPECIMEN STATUS REPORT

## 2017-11-01 LAB — IRON AND TIBC
Iron Saturation: 5 % — CL (ref 15–55)
Iron: 24 ug/dL — ABNORMAL LOW (ref 38–169)
Total Iron Binding Capacity: 492 ug/dL — ABNORMAL HIGH (ref 250–450)
UIBC: 468 ug/dL — ABNORMAL HIGH (ref 111–343)

## 2017-11-01 LAB — FERRITIN: FERRITIN: 10 ng/mL — AB (ref 30–400)

## 2017-11-08 DIAGNOSIS — D509 Iron deficiency anemia, unspecified: Secondary | ICD-10-CM | POA: Insufficient documentation

## 2017-11-08 NOTE — Progress Notes (Signed)
Chief Complaint  Patient presents with  . Follow-up    on left knee-states that he is feeling better.     Patient presents for 1 week follow-up.  At his physical last week, he was in severe pain due to gout flare in knees.  He was given prednisone (and tramadol for pain) and is feeling much better. Only needed to take 1 tramadol tablet, which was effective. He had increased beer intake, which was felt to be a contributing factor to this flare. He hasn't had any beer since his last visit.  He had deferred GU exam, due to pain with weight-bearing, so this will be done today.  Especially due to the fact that he was found to have a new iron deficiency anemia without any known cause based on history (no known bleeding, no GI complaints). He is on anticoagulants (xarelto). He reports occasionally seeing a small amount of blood on the toilet paper, related to hemorrhoids, but no other bleeding. Denies abdominal pain, fatigue, shortness of breath or other symptoms relating to cause or consequence of anemia.  Lab Results  Component Value Date   WBC 6.8 10/26/2017   HGB 10.4 (L) 10/26/2017   HCT 34.1 (L) 10/26/2017   MCV 75 (L) 10/26/2017   PLT 288 10/26/2017   Lab Results  Component Value Date   IRON 24 (L) 10/26/2017   TIBC 492 (H) 10/26/2017   FERRITIN 10 (L) 10/26/2017   PMH, PSH, SH reviewed Outpatient Encounter Medications as of 11/09/2017  Medication Sig  . allopurinol (ZYLOPRIM) 300 MG tablet TAKE 1 TABLET (300 MG TOTAL) DAILY BY MOUTH.  Marland Kitchen aspirin EC 81 MG tablet Take 1 tablet (81 mg total) by mouth daily.  . cholecalciferol (VITAMIN D) 1000 units tablet Take 1,000 Units by mouth daily.  Marland Kitchen COENZYME Q-10 PO Take 1 tablet by mouth daily.  . fish oil-omega-3 fatty acids 1000 MG capsule Take 4 g by mouth daily.   . metoprolol tartrate (LOPRESSOR) 25 MG tablet TAKE 1 TABLET (25 MG TOTAL) BY MOUTH 2 (TWO) TIMES DAILY.  Marland Kitchen Misc Natural Products (OSTEO BI-FLEX TRIPLE STRENGTH PO) Take 1 tablet  by mouth daily.  . niacin (NIASPAN) 1000 MG CR tablet TAKE 1 TABLET (1,000 MG TOTAL) BY MOUTH AT BEDTIME.  . rosuvastatin (CRESTOR) 40 MG tablet TAKE 1 TABLET (40 MG TOTAL) DAILY BY MOUTH.  . traMADol (ULTRAM) 50 MG tablet Take 1 tablet (50 mg total) by mouth every 8 (eight) hours as needed for moderate pain or severe pain.  Marland Kitchen XARELTO 20 MG TABS tablet TAKE 1 TABLET (20 MG TOTAL) BY MOUTH DAILY WITH SUPPER.  Marland Kitchen colchicine 0.6 MG tablet Take 1 tablet (0.6 mg total) by mouth daily. (Patient not taking: Reported on 11/09/2017)  . nitroGLYCERIN (NITROSTAT) 0.4 MG SL tablet Place 1 tablet (0.4 mg total) every 5 (five) minutes as needed under the tongue for chest pain. (Patient not taking: Reported on 11/09/2017)  . [DISCONTINUED] methylPREDNISolone (MEDROL DOSEPAK) 4 MG TBPK tablet Take as directed   No facility-administered encounter medications on file as of 11/09/2017.    No Known Allergies  ROS: no fever, chills, headache, dizziness, chest pain, shortness of breath, nausea, vomiting, heartburn, abdominal pain, melena, hematochezia.  +rare slight bleeding from hemorrhoids.  Knee pain significantly improved, though still swollen. Denies pain. See HPI  BP 120/80   Pulse 76   Ht '5\' 10"'  (1.778 m)   Wt 213 lb (96.6 kg)   BMI 30.56 kg/m   Well appearing  male, in good spirits, in no distress Abdomen: soft, nontender GU: normal external genitalia, circumsized, no lesions. Testicles are normal, no masses, no hernias Rectal: poss palpable internal hemorrhoid, soft, nontender. Normal sphincter tone.  No stool in vault for heme testing. Prostate is normal, not enlarged, no nodule. Left knee: some persistent effusion/swelling, but no longer warm to touch or tender Walking normally.  ASSESSMENT/PLAN:  Gout of knee, unspecified cause, unspecified chronicity, unspecified laterality - pain resolved and swellling improving s/p course of prednisone. Cont to avoid alcohol  Iron deficiency anemia, unspecified  iron deficiency anemia type - sent home with hemoccult kit. Likely will need f/u with Dr. Fuller Plan (colonoscopy was normal 2017), and if no GI source, to hematologist - Plan: CBC with Differential/Platelet   GI--Dr. Fuller Plan. Colonoscopy UTD 2017. May need EGD  ?need for July appt since being seen today. May just need labs (CBC, uric acid when not flaring, depending on what GI eval shows or when scheduled).  To start iron supplement after submitting stool cards.

## 2017-11-09 ENCOUNTER — Ambulatory Visit: Payer: Medicare Other | Admitting: Family Medicine

## 2017-11-09 ENCOUNTER — Encounter: Payer: Self-pay | Admitting: Family Medicine

## 2017-11-09 VITALS — BP 120/80 | HR 76 | Ht 70.0 in | Wt 213.0 lb

## 2017-11-09 DIAGNOSIS — D509 Iron deficiency anemia, unspecified: Secondary | ICD-10-CM | POA: Diagnosis not present

## 2017-11-09 DIAGNOSIS — M109 Gout, unspecified: Secondary | ICD-10-CM | POA: Diagnosis not present

## 2017-11-09 NOTE — Patient Instructions (Addendum)
Your prostate exam was normal. You didn't have much stool to test if there was any blood in it.  Please return the stool cards as soon as you can. I did think I felt a small hemorrhoid--let me know if you are having a lot of hemorrhoidal bleeding. We may need to send you back to [facility] for further evaluation of your GI tract due to iron deficiency anemia. I'm going to wait on your stool cards before making that decision. In the meantime, try and eat high iron foods. Once you have given [place] the stool cards, you can take an iron supplement (ferrous sulfate) once daily for a month. This should help boost up iron stores.  Stay off the beer. Return as scheduled next month.

## 2017-11-10 ENCOUNTER — Encounter: Payer: Self-pay | Admitting: Family Medicine

## 2017-11-10 ENCOUNTER — Other Ambulatory Visit: Payer: Self-pay | Admitting: Family Medicine

## 2017-11-10 DIAGNOSIS — R799 Abnormal finding of blood chemistry, unspecified: Secondary | ICD-10-CM

## 2017-11-10 LAB — CBC WITH DIFFERENTIAL/PLATELET
BASOS ABS: 0.1 10*3/uL (ref 0.0–0.2)
BASOS: 1 %
EOS (ABSOLUTE): 0.3 10*3/uL (ref 0.0–0.4)
Eos: 5 %
Hematocrit: 33.8 % — ABNORMAL LOW (ref 37.5–51.0)
Hemoglobin: 9.6 g/dL — ABNORMAL LOW (ref 13.0–17.7)
IMMATURE GRANS (ABS): 0 10*3/uL (ref 0.0–0.1)
Immature Granulocytes: 1 %
LYMPHS ABS: 2.7 10*3/uL (ref 0.7–3.1)
Lymphs: 37 %
MCH: 21.3 pg — AB (ref 26.6–33.0)
MCHC: 28.4 g/dL — AB (ref 31.5–35.7)
MCV: 75 fL — AB (ref 79–97)
MONOS ABS: 0.7 10*3/uL (ref 0.1–0.9)
Monocytes: 10 %
NEUTROS ABS: 3.5 10*3/uL (ref 1.4–7.0)
Neutrophils: 46 %
PLATELETS: 339 10*3/uL (ref 150–450)
RBC: 4.5 x10E6/uL (ref 4.14–5.80)
RDW: 16.4 % — AB (ref 12.3–15.4)
WBC: 7.2 10*3/uL (ref 3.4–10.8)

## 2017-11-10 MED ORDER — FERROUS SULFATE 325 (65 FE) MG PO TABS: 325.0000 mg | ORAL_TABLET | Freq: Three times a day (TID) | ORAL | 1 refills | Status: DC

## 2017-11-16 ENCOUNTER — Other Ambulatory Visit (INDEPENDENT_AMBULATORY_CARE_PROVIDER_SITE_OTHER): Payer: Medicare Other

## 2017-11-16 DIAGNOSIS — Z0000 Encounter for general adult medical examination without abnormal findings: Secondary | ICD-10-CM

## 2017-11-16 LAB — POC HEMOCCULT BLD/STL (HOME/3-CARD/SCREEN)
Card #2 Fecal Occult Blod, POC: NEGATIVE
FECAL OCCULT BLD: NEGATIVE
Fecal Occult Blood, POC: NEGATIVE

## 2017-11-21 ENCOUNTER — Other Ambulatory Visit: Payer: Self-pay | Admitting: Family Medicine

## 2017-11-21 DIAGNOSIS — M2540 Effusion, unspecified joint: Secondary | ICD-10-CM

## 2017-11-21 DIAGNOSIS — M109 Gout, unspecified: Secondary | ICD-10-CM

## 2017-11-21 NOTE — Telephone Encounter (Signed)
Is this okay to refill? 

## 2017-11-21 NOTE — Telephone Encounter (Signed)
Is he aware that it was refilled for him 6/10?  He should be using it just prn for gout, not daily.  Not sure if this was perhaps done as an auto-refill?  Please look into, shouldn't be needed.  (If pt does state that he needs it, please be sure that he is not back to drinking beer, which triggered his last gout flare).

## 2017-11-22 NOTE — Telephone Encounter (Signed)
Pt states he did not request this and does not need this as he has plenty of this med. I will deny med

## 2017-11-22 NOTE — Telephone Encounter (Signed)
Left message for pt to call me back 

## 2017-11-27 ENCOUNTER — Other Ambulatory Visit (INDEPENDENT_AMBULATORY_CARE_PROVIDER_SITE_OTHER): Payer: Medicare Other

## 2017-11-27 ENCOUNTER — Encounter: Payer: Self-pay | Admitting: Gastroenterology

## 2017-11-27 ENCOUNTER — Telehealth: Payer: Self-pay

## 2017-11-27 ENCOUNTER — Ambulatory Visit: Payer: Medicare Other | Admitting: Gastroenterology

## 2017-11-27 VITALS — BP 112/70 | HR 72 | Ht 69.0 in | Wt 214.1 lb

## 2017-11-27 DIAGNOSIS — Z7901 Long term (current) use of anticoagulants: Secondary | ICD-10-CM | POA: Diagnosis not present

## 2017-11-27 DIAGNOSIS — D509 Iron deficiency anemia, unspecified: Secondary | ICD-10-CM

## 2017-11-27 LAB — IGA: IgA: 909 mg/dL — ABNORMAL HIGH (ref 68–378)

## 2017-11-27 NOTE — Telephone Encounter (Signed)
Patient with diagnosis of Afib with noted clotting disorder on Xarelto for anticoagulation.    Procedure: endoscopy Date of procedure: 01/30/18  CHADS2-VASc score of  3 (CHF, HTN, AGE, DM2, stroke/tia x 2, CAD, AGE, male)  CrCl >54ml/min  Per office protocol, patient can hold Xarelto for 24 hours prior to procedure.

## 2017-11-27 NOTE — Patient Instructions (Addendum)
Your provider has requested that you go to the basement level for lab work before leaving today. Press "B" on the elevator. The lab is located at the first door on the left as you exit the elevator.  You have been scheduled for an endoscopy. Please follow written instructions given to you at your visit today. If you use inhalers (even only as needed), please bring them with you on the day of your procedure. Your physician has requested that you go to www.[email or web address].com and enter the access code given to you at your visit today. This web site gives a general overview about your procedure. However, you should still follow specific instructions given to you by our office regarding your preparation for the procedure.  Normal BMI (Body Mass Index- based on height and weight) is between 23 and 30. Your BMI today is Body mass index is 31.62 kg/m. Marland Kitchen Please consider follow up  regarding your BMI with your Primary Care Provider.  Thank you for choosing me and [facility].  Pricilla Riffle. Dagoberto Ligas., MD., Marval Regal

## 2017-11-27 NOTE — Telephone Encounter (Signed)
[facility] Pre-operative Risk Assessment     Request for surgical clearance:     Endoscopy Procedure  What type of surgery is being performed?     Upper Endoscopy  When is this surgery scheduled?     01/30/18  What type of clearance is required ?   Pharmacy  Are there any medications that need to be held prior to surgery and how long? Xarelto x 2 days  Practice name and name of physician performing surgery?      [facility]  What is your office phone and fax number?      Phone- [phone]  [phone]  Anesthesia type (None, local, MAC, general) ?       MAC

## 2017-11-27 NOTE — Telephone Encounter (Signed)
Pharm please address 

## 2017-11-27 NOTE — Progress Notes (Signed)
    History of Present Illness: This is a 67 year old male referred by Rita Ohara, MD for new iron deficiency anemia with Hemoccult negative stool.  Hemoglobin = 9.6, MCV = 75, ferritin =10, iron saturation = 5%.  Colonoscopy in June 2017: 3 polyps removed (not precancerous), left colon diverticulosis and internal hemorrhoids.  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  A couple months ago he had problems with bleeding gums on daily basis for 4 to 6 weeks and this problem has resolved.  No family history of celiac disease.  No gastrointestinal complaints. Denies weight loss, abdominal pain, constipation, diarrhea, change in stool caliber, melena, hematochezia, nausea, vomiting, dysphagia, reflux symptoms, chest pain.  Current Medications, Allergies, Past Medical History, Past Surgical History, Family History and Social History were reviewed in [employer] record.  Physical Exam: General: Well developed, well nourished, no acute distress Head: Normocephalic and atraumatic Eyes:  sclerae anicteric, EOMI Ears: Normal auditory acuity Mouth: No deformity or lesions Lungs: Clear throughout to auscultation Heart: Regular rate and rhythm; no murmurs, rubs or bruits Abdomen: Soft, non tender and non distended. No masses, hepatosplenomegaly or hernias noted. Normal Bowel sounds Rectal: Not done - stool was heme negative on 6/27 Musculoskeletal: Symmetrical with no gross deformities  Pulses:  Normal pulses noted Extremities: No clubbing, cyanosis, edema or deformities noted Neurological: Alert oriented x 4, grossly nonfocal Psychological:  Alert and cooperative. Normal mood and affect   Assessment and Recommendations:  1. New iron deficiency anemia with Hemoccult negative stool.  Bleeding gums are potential etiology.  Rule out celiac disease, ulcer, AVMs, etc. IgA, tTG today.  Continue iron replacement as prescribed.  Schedule EGD. The risks  (including bleeding, perforation, infection, missed lesions, medication reactions and possible hospitalization or surgery if complications occur), benefits, and alternatives to endoscopy with possible biopsy and possible dilation were discussed with the patient and they consent to proceed.   2. Hold Xarelto 2 days before procedure - will instruct when and how to resume after procedure. Low but real risk of cardiovascular event such as heart attack, stroke, embolism, thrombosis or ischemia/infarct of other organs off Xarelto explained and need to seek urgent help if this occurs. The patient consents to proceed. Will communicate by phone or EMR with patient's prescribing provider to confirm that holding Xarelto is reasonable in this case.

## 2017-11-28 LAB — TISSUE TRANSGLUTAMINASE, IGA: (TTG) AB, IGA: 1 U/mL

## 2017-11-28 NOTE — Telephone Encounter (Signed)
Is a 24 hour hold on Xarelto ok Dr. Fuller Plan for procedure?

## 2017-11-28 NOTE — Telephone Encounter (Signed)
Patient notified to hold Xarelto 24 hours prior to his procedure. Patient verbalized understanding.

## 2017-11-28 NOTE — Telephone Encounter (Signed)
24 hour hold with normal renal function is acceptable.

## 2017-11-29 ENCOUNTER — Telehealth: Payer: Self-pay | Admitting: Gastroenterology

## 2017-11-29 NOTE — Telephone Encounter (Signed)
Patient notified that celiac disease testing was negative.  All questions answered.  He will call back for additional questions or concerns.

## 2017-11-30 ENCOUNTER — Ambulatory Visit: Payer: Medicare Other | Admitting: Family Medicine

## 2017-12-02 ENCOUNTER — Other Ambulatory Visit: Payer: Self-pay | Admitting: Family Medicine
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
## 2017-12-31 NOTE — Patient Instructions (Addendum)
Follow up with Dr. Fuller Plan for endoscopy as scheduled in September.  Please schedule an appointment with the dentist.  The gum bleeding may be a significant source of bleeding--you have significant decay and some gum disease.  It is very important that you get this taken care of.  Go to the pharmacy and pick up the refill that should be there on the iron.  We will let you know based on the test results if your directions for taking it will change.  Continue to avoid all alcohol. Use colchicine as directed--only take 2 tablets on the first day of a flare, then once daily until resolved.  Low-Purine Diet Purines are compounds that affect the level of uric acid in your body. A low-purine diet is a diet that is low in purines. Eating a low-purine diet can prevent the level of uric acid in your body from getting too high and causing gout or kidney stones or both. What do I need to know about this diet?  Choose low-purine foods. Examples of low-purine foods are listed in the next section.  Drink plenty of fluids, especially water. Fluids can help remove uric acid from your body. Try to drink 8-16 cups (1.9-3.8 L) a day.  Limit foods high in fat, especially saturated fat, as fat makes it harder for the body to get rid of uric acid. Foods high in saturated fat include pizza, cheese, ice cream, whole milk, fried foods, and gravies. Choose foods that are lower in fat and lean sources of protein. Use olive oil when cooking as it contains healthy fats that are not high in saturated fat.  Limit alcohol. Alcohol interferes with the elimination of uric acid from your body. If you are having a gout attack, avoid all alcohol.  Keep in mind that different people's bodies react differently to different foods. You will probably learn over time which foods do or do not affect you. If you discover that a food tends to cause your gout to flare up, avoid eating that food. You can more freely enjoy foods that do not cause  problems. If you have any questions about a food item, talk to your dietitian or health care provider. Which foods are low, moderate, and high in purines? The following is a list of foods that are low, moderate, and high in purines. You can eat any amount of the foods that are low in purines. You may be able to have small amounts of foods that are moderate in purines. Ask your health care provider how much of a food moderate in purines you can have. Avoid foods high in purines. Grains  Foods low in purines: Enriched white bread, pasta, rice, cake, cornbread, popcorn.  Foods moderate in purines: Whole-grain breads and cereals, wheat germ, bran, oatmeal. Uncooked oatmeal. Dry wheat bran or wheat germ.  Foods high in purines: Pancakes, [place] toast, biscuits, muffins. Vegetables  Foods low in purines: All vegetables, except those that are moderate in purines.  Foods moderate in purines: Asparagus, cauliflower, spinach, mushrooms, green peas. Fruits  All fruits are low in purines. Meats and other Protein Foods  Foods low in purines: Eggs, nuts, peanut butter.  Foods moderate in purines: 80-90% lean beef, lamb, veal, pork, poultry, fish, eggs, peanut butter, nuts. Crab, lobster, oysters, and shrimp. Cooked dried beans, peas, and lentils.  Foods high in purines: Anchovies, sardines, herring, mussels, tuna, codfish, scallops, trout, and haddock. Berniece Salines. Organ meats (such as liver or kidney). Tripe. Game meat. Goose. Sweetbreads. Dairy  All dairy foods are low in purines. Low-fat and fat-free dairy products are best because they are low in saturated fat. Beverages  Drinks low in purines: Water, carbonated beverages, tea, coffee, cocoa.  Drinks moderate in purines: Soft drinks and other drinks sweetened with high-fructose corn syrup. Juices. To find whether a food or drink is sweetened with high-fructose corn syrup, look at the ingredients list.  Drinks high in purines: Alcoholic beverages  (such as beer). Condiments  Foods low in purines: Salt, herbs, olives, pickles, relishes, vinegar.  Foods moderate in purines: Butter, margarine, oils, mayonnaise. Fats and Oils  Foods low in purines: All types, except gravies and sauces made with meat.  Foods high in purines: Gravies and sauces made with meat. Other Foods  Foods low in purines: Sugars, sweets, gelatin. Cake. Soups made without meat.  Foods moderate in purines: Meat-based or fish-based soups, broths, or bouillons. Foods and drinks sweetened with high-fructose corn syrup.  Foods high in purines: High-fat desserts (such as ice cream, cookies, cakes, pies, doughnuts, and chocolate). Contact your dietitian for more information on foods that are not listed here. This information is not intended to replace advice given to you by your health care provider. Make sure you discuss any questions you have with your health care provider. Document Released: 09/03/2010 Document Revised: 10/15/2015 Document Reviewed: 04/15/2013 Elsevier Interactive Patient Education  2017 [employer].

## 2018-01-01 ENCOUNTER — Ambulatory Visit: Payer: Medicare Other | Admitting: Family Medicine

## 2018-01-01 ENCOUNTER — Encounter: Payer: Self-pay | Admitting: Family Medicine

## 2018-01-01 VITALS — BP 140/80 | HR 68 | Ht 69.0 in | Wt 216.0 lb

## 2018-01-01 DIAGNOSIS — D509 Iron deficiency anemia, unspecified: Secondary | ICD-10-CM | POA: Diagnosis not present

## 2018-01-01 DIAGNOSIS — Z5181 Encounter for therapeutic drug level monitoring: Secondary | ICD-10-CM | POA: Diagnosis not present

## 2018-01-01 DIAGNOSIS — M109 Gout, unspecified: Secondary | ICD-10-CM | POA: Diagnosis not present

## 2018-01-01 MED ORDER — COLCHICINE 0.6 MG PO TABS: ORAL_TABLET | ORAL | 0 refills | Status: DC

## 2018-01-02 LAB — CBC WITH DIFFERENTIAL/PLATELET
BASOS: 1 %
Basophils Absolute: 0.1 10*3/uL (ref 0.0–0.2)
EOS (ABSOLUTE): 0.4 10*3/uL (ref 0.0–0.4)
EOS: 6 %
HEMATOCRIT: 38.2 % (ref 37.5–51.0)
Hemoglobin: 12.6 g/dL — ABNORMAL LOW (ref 13.0–17.7)
IMMATURE GRANS (ABS): 0 10*3/uL (ref 0.0–0.1)
IMMATURE GRANULOCYTES: 0 %
LYMPHS: 32 %
Lymphocytes Absolute: 2.1 10*3/uL (ref 0.7–3.1)
MCH: 26.4 pg — AB (ref 26.6–33.0)
MCHC: 33 g/dL (ref 31.5–35.7)
MCV: 80 fL (ref 79–97)
Monocytes Absolute: 0.5 10*3/uL (ref 0.1–0.9)
Monocytes: 8 %
NEUTROS ABS: 3.5 10*3/uL (ref 1.4–7.0)
Neutrophils: 53 %
Platelets: 220 10*3/uL (ref 150–450)
RBC: 4.77 x10E6/uL (ref 4.14–5.80)
RDW: 19.1 % — ABNORMAL HIGH (ref 12.3–15.4)
WBC: 6.5 10*3/uL (ref 3.4–10.8)

## 2018-01-02 LAB — FERRITIN: Ferritin: 22 ng/mL — ABNORMAL LOW (ref 30–400)

## 2018-01-02 LAB — URIC ACID: URIC ACID: 3.6 mg/dL — AB (ref 3.7–8.6)

## 2018-01-06 ENCOUNTER — Other Ambulatory Visit: Payer: Self-pay | Admitting: Family Medicine

## 2018-01-06 DIAGNOSIS — M109 Gout, unspecified: Secondary | ICD-10-CM

## 2018-01-08 NOTE — Telephone Encounter (Signed)
Not okay to refill. If he is having ongoing/worsening knee pain, he was supposed to have x-ray (he had declined xray, said he would let us know)

## 2018-01-08 NOTE — Telephone Encounter (Signed)
Denied and left message for patient.

## 2018-01-08 NOTE — Telephone Encounter (Signed)
Is this ok to refill?  

## 2018-01-11 ENCOUNTER — Other Ambulatory Visit: Payer: Self-pay | Admitting: *Deleted

## 2018-01-11 DIAGNOSIS — M25562 Pain in left knee: Secondary | ICD-10-CM

## 2018-01-15 ENCOUNTER — Ambulatory Visit
Admission: RE | Admit: 2018-01-15 | Discharge: 2018-01-15 | Disposition: A | Discharge status: Home / Self Care | Payer: Medicare Other | Source: Ambulatory Visit | Attending: Family Medicine | Admitting: Family Medicine

## 2018-01-15 ENCOUNTER — Other Ambulatory Visit: Payer: Self-pay | Admitting: Family Medicine

## 2018-01-15 DIAGNOSIS — M25562 Pain in left knee: Secondary | ICD-10-CM

## 2018-01-18 ENCOUNTER — Encounter: Payer: Self-pay | Admitting: Gastroenterology

## 2018-01-22 ENCOUNTER — Other Ambulatory Visit: Payer: Self-pay | Admitting: Family Medicine

## 2018-01-22 DIAGNOSIS — I2510 Atherosclerotic heart disease of native coronary artery without angina pectoris: Secondary | ICD-10-CM

## 2018-01-22 DIAGNOSIS — I10 Essential (primary) hypertension: Secondary | ICD-10-CM

## 2018-01-24 ENCOUNTER — Other Ambulatory Visit: Payer: Self-pay | Admitting: Family Medicine

## 2018-01-24 DIAGNOSIS — M109 Gout, unspecified: Secondary | ICD-10-CM

## 2018-01-30 ENCOUNTER — Ambulatory Visit (AMBULATORY_SURGERY_CENTER): Payer: Medicare Other | Admitting: Gastroenterology

## 2018-01-30 ENCOUNTER — Encounter: Payer: Self-pay | Admitting: Gastroenterology

## 2018-01-30 VITALS — BP 106/71 | HR 63 | Temp 97.8°F | Resp 15 | Ht 69.0 in | Wt 214.0 lb

## 2018-01-30 DIAGNOSIS — D508 Other iron deficiency anemias: Secondary | ICD-10-CM

## 2018-01-30 DIAGNOSIS — K319 Disease of stomach and duodenum, unspecified: Secondary | ICD-10-CM

## 2018-01-30 DIAGNOSIS — B9681 Helicobacter pylori [H. pylori] as the cause of diseases classified elsewhere: Secondary | ICD-10-CM | POA: Diagnosis not present

## 2018-01-30 DIAGNOSIS — K2950 Unspecified chronic gastritis without bleeding: Secondary | ICD-10-CM

## 2018-01-30 MED ORDER — SODIUM CHLORIDE 0.9 % IV SOLN: 500.0000 mL | Freq: Once | INTRAVENOUS | Status: DC

## 2018-01-30 NOTE — Progress Notes (Signed)
A and O x3. Report to RN. Tolerated MAC anesthesia well.Teeth unchanged after procedure.

## 2018-01-30 NOTE — Op Note (Signed)
[facility] Patient Name: Taimur Fier Procedure Date: 01/30/2018 9:53 AM MRN: [id number] Endoscopist: Ladene Artist , MD Age: 67 Referring MD:  Date of Birth: [birth year] Gender: Male Account #: [id number] Procedure:                Upper GI endoscopy Indications:              Iron deficiency anemia Medicines:                Monitored Anesthesia Care Procedure:                Pre-Anesthesia Assessment:                           - Prior to the procedure, a History and Physical                            was performed, and patient medications and                            allergies were reviewed. The patient's tolerance of                            previous anesthesia was also reviewed. The risks                            and benefits of the procedure and the sedation                            options and risks were discussed with the patient.                            All questions were answered, and informed consent                            was obtained. Prior Anticoagulants: The patient has                            taken Xarelto (rivaroxaban), last dose was 2 days                            prior to procedure. ASA Grade Assessment: III - A                            patient with severe systemic disease. After                            reviewing the risks and benefits, the patient was                            deemed in satisfactory condition to undergo the                            procedure.  After obtaining informed consent, the endoscope was                            passed under direct vision. Throughout the                            procedure, the patient's blood pressure, pulse, and                            oxygen saturations were monitored continuously. The                            Endoscope was introduced through the mouth, and                            advanced to the second part of duodenum. The upper          GI endoscopy was accomplished without difficulty.                            The patient tolerated the procedure well. Scope In: Scope Out: Findings:                 The Z-line was variable and was found at the                            gastroesophageal junction. Biopsies were taken with                            a cold forceps for histology.                           The exam of the esophagus was otherwise normal.                           Patchy mildly erythematous mucosa without bleeding                            was found in the gastric fundus and in the gastric                            body. Biopsies were taken with a cold forceps for                            histology.                           The exam of the stomach was otherwise normal.                           The duodenal bulb and second portion of the                            duodenum were normal. Complications:            No immediate complications.  Estimated Blood Loss:     Estimated blood loss was minimal. Impression:               - Z-line variable, at the gastroesophageal                            junction. Biopsied.                           - Erythematous mucosa in the gastric fundus and                            gastric body. Biopsied.                           - Normal duodenal bulb and second portion of the                            duodenum. Recommendation:           - Patient has a contact number available for                            emergencies. The signs and symptoms of potential                            delayed complications were discussed with the                            patient. Return to normal activities tomorrow.                            Written discharge instructions were provided to the                            patient.                           - Resume previous diet.                           - Continue present medications.                           - Await pathology  results.                           - Resume Xarelto (rivaroxaban) at prior dose                            tomorrow. Refer to managing physician for further                            adjustment of therapy.                           - To visualize the small bowel, perform video  capsule endoscopy at appointment to be scheduled. Ladene Artist, MD 01/30/2018 10:12:33 AM This report has been signed electronically.

## 2018-01-30 NOTE — Patient Instructions (Signed)
    [place]    INFORMATION ON GASTRITIS GIVEN TO YOU TODAY    AWAIT PATHOLOGY RESULTS  DR STARK'S OFFICE NURSE WILL CALL YOU TO SET UP DATE AND TIME FOR CAPSULE ENDOSCOPY TO BE ORDERED BY DR STARK   YOU HAD AN ENDOSCOPIC PROCEDURE TODAY AT [place]:   Refer to the procedure report that was given to you for any specific questions about what was found during the examination.  If the procedure report does not answer your questions, please call your gastroenterologist to clarify.  If you requested that your care partner not be given the details of your procedure findings, then the procedure report has been included in a sealed envelope for you to review at your convenience later.  YOU SHOULD EXPECT: Some feelings of bloating in the abdomen. Passage of more gas than usual.  Walking can help get rid of the air that was put into your GI tract during the procedure and reduce the bloating. If you had a lower endoscopy (such as a colonoscopy or flexible sigmoidoscopy) you may notice spotting of blood in your stool or on the toilet paper. If you underwent a bowel prep for your procedure, you may not have a normal bowel movement for a few days.  Please Note:  You might notice some irritation and congestion in your nose or some drainage.  This is from the oxygen used during your procedure.  There is no need for concern and it should clear up in a day or so.  SYMPTOMS TO REPORT IMMEDIATELY:     Following upper endoscopy (EGD)  Vomiting of blood or coffee ground material  New chest pain or pain under the shoulder blades  Painful or persistently difficult swallowing  New shortness of breath  Fever of 100F or higher  Black, tarry-looking stools  For urgent or emergent issues, a gastroenterologist can be reached at any hour by calling [phone].   DIET:  We do recommend a small meal at first, but then you may proceed to your regular diet.   Drink plenty of fluids but you should avoid alcoholic beverages for 24 hours.  ACTIVITY:  You should plan to take it easy for the rest of today and you should NOT DRIVE or use heavy machinery until tomorrow (because of the sedation medicines used during the test).    FOLLOW UP: Our staff will call the number listed on your records the next business day following your procedure to check on you and address any questions or concerns that you may have regarding the information given to you following your procedure. If we do not reach you, we will leave a message.  However, if you are feeling well and you are not experiencing any problems, there is no need to return our call.  We will assume that you have returned to your regular daily activities without incident.  If any biopsies were taken you will be contacted by phone or by letter within the next 1-3 weeks.  Please call us at [phone] if you have not heard about the biopsies in 3 weeks.    SIGNATURES/CONFIDENTIALITY: You and/or your care partner have signed paperwork which will be entered into your electronic medical record.  These signatures attest to the fact that that the information above on your After Visit Summary has been reviewed and is understood.  Full responsibility of the confidentiality of this discharge information lies with you and/or your care-partner.

## 2018-01-30 NOTE — Progress Notes (Signed)
Called to room to assist during endoscopic procedure.  Patient ID and intended procedure confirmed with present staff. Received instructions for my participation in the procedure from the performing physician.  

## 2018-01-31 ENCOUNTER — Telehealth: Payer: Self-pay | Admitting: *Deleted

## 2018-01-31 ENCOUNTER — Telehealth: Payer: Self-pay

## 2018-01-31 NOTE — Telephone Encounter (Signed)
  Follow up Call-  Call back number 01/30/2018 11/10/2015  Post procedure Call Back phone  # [phone]  Permission to leave phone message Yes Yes  Some recent data might be hidden     Patient questions:  Do you have a fever, pain , or abdominal swelling? No. Pain Score  0 *  Have you tolerated food without any problems? Yes.    Have you been able to return to your normal activities? Yes.    Do you have any questions about your discharge instructions: Diet   No. Medications  No. Follow up visit  No.  Do you have questions or concerns about your Care? No.  Actions: * If pain score is 4 or above: No action needed, pain <4.

## 2018-01-31 NOTE — Telephone Encounter (Signed)
  Follow up Call-  Call back number 01/30/2018 11/10/2015  Post procedure Call Back phone  # [phone]  Permission to leave phone message Yes Yes  Some recent data might be hidden     Left message

## 2018-02-12 ENCOUNTER — Other Ambulatory Visit: Payer: Self-pay

## 2018-02-12 MED ORDER — BIS SUBCIT-METRONID-TETRACYC 140-125-125 MG PO CAPS: 3.0000 | ORAL_CAPSULE | Freq: Three times a day (TID) | ORAL | 0 refills | Status: DC

## 2018-02-12 MED ORDER — OMEPRAZOLE 20 MG PO CPDR: 20.0000 mg | DELAYED_RELEASE_CAPSULE | Freq: Two times a day (BID) | ORAL | 0 refills | Status: DC

## 2018-02-13 MED ORDER — METRONIDAZOLE 250 MG PO TABS: 250.0000 mg | ORAL_TABLET | Freq: Four times a day (QID) | ORAL | 0 refills | Status: AC

## 2018-02-13 MED ORDER — BISMUTH SUBSALICYLATE 262 MG PO CHEW: 524.0000 mg | CHEWABLE_TABLET | Freq: Four times a day (QID) | ORAL | 0 refills | Status: AC

## 2018-02-13 MED ORDER — DOXYCYCLINE HYCLATE 100 MG PO CAPS: 100.0000 mg | ORAL_CAPSULE | Freq: Two times a day (BID) | ORAL | 0 refills | Status: AC

## 2018-02-13 NOTE — Addendum Note (Signed)
Addended by: Marlon Pel on: 02/13/2018 03:14 PM   Modules accepted: Orders

## 2018-02-22 ENCOUNTER — Other Ambulatory Visit: Payer: Self-pay | Admitting: Family Medicine

## 2018-02-22 NOTE — Telephone Encounter (Signed)
I just want to confirm that you want me to deny this-last CBC results state for him to take last rx that he was going to pick up at pharmacy BID until it runs out.

## 2018-02-22 NOTE — Telephone Encounter (Signed)
True.  I denied it.

## 2018-02-25 ENCOUNTER — Other Ambulatory Visit: Payer: Self-pay | Admitting: Internal Medicine

## 2018-02-25 DIAGNOSIS — I480 Paroxysmal atrial fibrillation: Secondary | ICD-10-CM

## 2018-03-30 ENCOUNTER — Other Ambulatory Visit (INDEPENDENT_AMBULATORY_CARE_PROVIDER_SITE_OTHER): Payer: Medicare Other

## 2018-03-30 ENCOUNTER — Telehealth: Payer: Self-pay | Admitting: Family Medicine

## 2018-03-30 DIAGNOSIS — Z23 Encounter for immunization: Secondary | ICD-10-CM | POA: Diagnosis not present

## 2018-03-30 NOTE — Telephone Encounter (Signed)
He should ask his orthopedist (who is treating the condition that requires this)

## 2018-03-30 NOTE — Telephone Encounter (Signed)
Called and informed pt he is going to call them

## 2018-03-30 NOTE — Addendum Note (Signed)
Addended by: Gwinda Maine on: 03/30/2018 08:34 AM   Modules accepted: Orders

## 2018-03-30 NOTE — Telephone Encounter (Signed)
Pt is wanting to know if he can get a handicap placard he is going to get hip surgery in December, states he has a hard time walking long distances, put in your folder, pt can be reached at [phone] informed pt that you was in not our office today

## 2018-04-16 ENCOUNTER — Other Ambulatory Visit: Payer: Self-pay | Admitting: Family Medicine

## 2018-04-16 DIAGNOSIS — M109 Gout, unspecified: Secondary | ICD-10-CM

## 2018-04-16 DIAGNOSIS — I2510 Atherosclerotic heart disease of native coronary artery without angina pectoris: Secondary | ICD-10-CM

## 2018-04-16 DIAGNOSIS — I10 Essential (primary) hypertension: Secondary | ICD-10-CM

## 2018-04-22 ENCOUNTER — Other Ambulatory Visit: Payer: Self-pay | Admitting: Family Medicine

## 2018-04-22 DIAGNOSIS — I2510 Atherosclerotic heart disease of native coronary artery without angina pectoris: Secondary | ICD-10-CM

## 2018-04-22 DIAGNOSIS — E782 Mixed hyperlipidemia: Secondary | ICD-10-CM

## 2018-05-08 NOTE — Progress Notes (Signed)
Chief Complaint  Patient presents with  . Hypertension    fasting med check. No concerns.    He sees Dr. Alvan Dame, who wants to do hip surgery, thinks it will help with his knees.  He made him see the dentist prior to any surgery. He has been going, had perio-scaling, had his wisdom teeth removed last week  Waiting for final clearance from the oral surgeon/dentist to Dr Alvan Dame before surgery will be scheduled.  This is for his left hip. No records received here from Dr. Alvan Dame.  Iron deficiency anemia.  EGD done 01/2018 showed H.pylori gastritis on gastric biospies, reflux changes on esophageal biopsies.  He was treated by GI with Pylera 3 po qid, 14d, Omeprazole 20 mg po bid, 14d. He hasn't had f/u with GI since.  He is currently taking iron twice daily. Lab Results  Component Value Date   WBC 6.5 01/01/2018   HGB 12.6 (L) 01/01/2018   HCT 38.2 01/01/2018   MCV 80 01/01/2018   PLT 220 01/01/2018   Lab Results  Component Value Date   IRON 24 (L) 10/26/2017   TIBC 492 (H) 10/26/2017   FERRITIN 22 (L) 01/01/2018   Gout: Diagnosed in May, 2018 when he presented with bilateral knee effusions. He was started on 363m of allopurinol daily, and f/u uric acid level was normal. He has colchicine to use prn for any flare.  At his June visit, he had flare of knee pain/swelling.  He was treated (at physical) with oral steroids and tramadol.  He had increased his beer intake during that time. At his f/u visit in August, he was doing better, hadn't been drinking any beer, and still hasn't had any since that time.  In August, x-rays were performed, which showed: IMPRESSION: Severe degenerative joint disease. No acute abnormality seen in the left knee.  Knee pain is overall much better.  Currently complains of left lateral hip pain. Knees haven't been swelling, hasn't needed any colchicine.  Hypertension follow-up: Blood pressures elsewhere are upper 120's-130/low 80'sat home, checks every other day.He  reports his monitor was accurate. Denies headaches, dizziness, chest pain, edema, dyspnea with exertion, side effects to medications.   Hyperlipidemia follow-up: Patient is reportedly following a low-fat, low cholesterol diet. Compliant with medications and denies medication side effects. Medication was changed from atorvastatin to rosuvastatin, and f/u lipids were improved. He did not reach LDL goal with atorvastatin. His diet hasn't changed--He uses 1% milk, has cereal every morning. He has 2-3 eggs/week, cut back on cheese. Red meat 2x/week. Mostly lots of chicken and [place]  Confirmed 04/2018, no changes in diet (maybe less red meat). Lab Results  Component Value Date   CHOL 126 10/26/2017   HDL 52 10/26/2017   LDLCALC 51 10/26/2017   TRIG 115 10/26/2017   CHOLHDL 2.4 10/26/2017   Impaired fasting glucose--noted 07/2013. He had mildly elevated glucose (106)in the past, with normal A1c's. He continues to follow a lower carb diet than in the past, eating how his diabetic wife eats.  Lab Results  Component Value Date   HGBA1C 5.1 09/22/2016   H/oVitamin D deficiency--level was low at 28 at his physical 02/2015.Whentaking 1000 IU daily,f/u level was normal in October 2017 (41). He continues to take a daily vitamin D.  Paroxysmal atrial fibrillation and coronary artery disease, status post non-ST elevation MI, with preserved left ventricular systolic function:He is on Xarelto and ASA 839mHe has been maintaining sinus rhythmas far as he knows. He denies any bleeding (  just mild hemorrhoidal bleeding, rare), bruising. He last saw Dr. Lovena Le in 09/2017 and no changes were made.  He does not have a f/u visit scheduled with cardiology.  Patient advised that he will need to have cardiac clearance prior to any surgery, so likely will need to schedule a visit with him soon.   PMH, PSH, SH reviewed  Outpatient Encounter Medications as of 05/10/2018  Medication Sig Note  . allopurinol  (ZYLOPRIM) 300 MG tablet TAKE 1 TABLET (300 MG TOTAL) DAILY BY MOUTH.   Marland Kitchen aspirin EC 81 MG tablet Take 1 tablet (81 mg total) by mouth daily.   . cholecalciferol (VITAMIN D) 1000 units tablet Take 1,000 Units by mouth daily.   Marland Kitchen COENZYME Q-10 PO Take 1 tablet by mouth daily.   . ferrous sulfate 325 (65 FE) MG tablet TAKE 1 TABLET (325 MG TOTAL) BY MOUTH 3 (THREE) TIMES DAILY WITH MEALS. 05/10/2018: Taking twice daily  . fish oil-omega-3 fatty acids 1000 MG capsule Take 4 g by mouth daily.    . metoprolol tartrate (LOPRESSOR) 25 MG tablet TAKE 1 TABLET BY MOUTH TWICE A DAY   . niacin (NIASPAN) 1000 MG CR tablet TAKE 1 TABLET (1,000 MG TOTAL) BY MOUTH AT BEDTIME.   . rosuvastatin (CRESTOR) 40 MG tablet TAKE 1 TABLET (40 MG TOTAL) DAILY BY MOUTH.   Alveda Reasons 20 MG TABS tablet TAKE 1 TABLET (20 MG TOTAL) BY MOUTH DAILY WITH SUPPER.   Marland Kitchen colchicine 0.6 MG tablet Take 2 by mouth at onset of flare and then once daily if needed. (Patient not taking: Reported on 05/10/2018)   . nitroGLYCERIN (NITROSTAT) 0.4 MG SL tablet Place 1 tablet (0.4 mg total) every 5 (five) minutes as needed under the tongue for chest pain. (Patient not taking: Reported on 01/01/2018)   . [DISCONTINUED] Misc Natural Products (OSTEO BI-FLEX JOINT SHIELD PO) Take 1 tablet by mouth daily.   . [DISCONTINUED] omeprazole (PRILOSEC) 20 MG capsule Take 1 capsule (20 mg total) by mouth 2 (two) times daily for 14 days.   . [DISCONTINUED] traMADol (ULTRAM) 50 MG tablet Take 1 tablet (50 mg total) by mouth every 8 (eight) hours as needed for moderate pain or severe pain. (Patient not taking: Reported on 05/10/2018)    No facility-administered encounter medications on file as of 05/10/2018.    No Known Allergies  ROS:  Denies fever, chills, URI symptoms, cough, shortness of breath, chest pain, edema.  No heartburn, nausea, vomiting, diarrhea, bowel changes (rare hemorrhoid flare). Denies urinary complaints, moods are good.  No bleeding, bruising,  rashes. +weight gain (14# since last visit). He reports this is due to less activity due to hip pain, and eating more through Thanksgiving.   PHYSICAL EXAM:  BP 140/72   Pulse 68   Ht '5\' 9"'  (1.753 m)   Wt 228 lb 3.2 oz (103.5 kg)   BMI 33.70 kg/m   128/76 on repeat by MD  Wt Readings from Last 3 Encounters:  05/10/18 228 lb 3.2 oz (103.5 kg)  01/30/18 214 lb (97.1 kg)  01/01/18 216 lb (98 kg)   Pleasant, well-appearing elderly male in good spirits HEENT: PERRL, EOMI, conjunctiva and sclera are clear. OP: has had significant dental work done, missing some, but overall appears improved, gums appear healthy. Neck: no lymphadenopathy, mass, bruit Heart: regular rate and rhythm Lungs: clear bilaterally Back: no spinal or CVA tenderness Extremities: no edema.  He is tender at left trochanteric bursa. Knees without effusion/warmth.  Neuro: alert  and oriented, cranial nerves intact.  Walks with cane Psych: normal mood, affect, hygiene and grooming  ASSESSMENT/PLAN:  Iron deficiency anemia, unspecified iron deficiency anemia type - related to H.pylori gastritis which was treated in September. Currently on BID FeSo4, due for f/u labs - Plan: CBC with Differential/Platelet, Ferritin, Iron  Gout of knee, unspecified cause, unspecified chronicity, unspecified laterality - no longer problematic. Cont allopurinol and avoidance of alcohol  Osteoarthritis of left knee, unspecified osteoarthritis type  Essential hypertension - Plan: Comprehensive metabolic panel  Medication monitoring encounter - Plan: Comprehensive metabolic panel, CBC with Differential/Platelet, Ferritin, Iron  Mixed hyperlipidemia - lipids at goal on current regimen - Plan: niacin (NIASPAN) 1000 MG CR tablet, Comprehensive metabolic panel  Weight gain - Plan: TSH  Left hip pain - no records from Dr. Alvan Dame, pt reports surgery to be scheduled, so suspect OA. Currently has e/o trochanteric bursitis--discuss poss cortisone  shot w/ortho    CBC, iron, ferritin c-met (fasting), TSH (due to weight gain) Lipids not needed, at goal on last check, and uric acid low.  F/u as scheduled 6/20202 for CPE   Continue all of your current medications. Contact your pharmacy when other refills are needed (some will be due the end of February).  You are tender over your left trochanteric bursa.  I have no records from Dr. Alvan Dame to know about your hip pain, or x-ray results, but you may also have a component of bursitis.  This is treated differently (with a cortisone shot), and you may not need to wait for your dental clearance to try a cortisone shot for the pain you have at the SIDE of your hip (where I pressed and you said it hurt).  You gained 14 pounds since your last visit.  I know your exercise is limited, which means you need to pay more attention to healthy food choices and portion control. If you continue to gain, it will only make your hip and knee pain worse.

## 2018-05-10 ENCOUNTER — Ambulatory Visit: Payer: Medicare Other | Admitting: Family Medicine

## 2018-05-10 ENCOUNTER — Encounter: Payer: Self-pay | Admitting: Family Medicine

## 2018-05-10 VITALS — BP 128/76 | HR 68 | Ht 69.0 in | Wt 228.2 lb

## 2018-05-10 DIAGNOSIS — R635 Abnormal weight gain: Secondary | ICD-10-CM

## 2018-05-10 DIAGNOSIS — I10 Essential (primary) hypertension: Secondary | ICD-10-CM

## 2018-05-10 DIAGNOSIS — E782 Mixed hyperlipidemia: Secondary | ICD-10-CM

## 2018-05-10 DIAGNOSIS — Z5181 Encounter for therapeutic drug level monitoring: Secondary | ICD-10-CM

## 2018-05-10 DIAGNOSIS — D509 Iron deficiency anemia, unspecified: Secondary | ICD-10-CM

## 2018-05-10 DIAGNOSIS — M25552 Pain in left hip: Secondary | ICD-10-CM

## 2018-05-10 DIAGNOSIS — M1712 Unilateral primary osteoarthritis, left knee: Secondary | ICD-10-CM | POA: Diagnosis not present

## 2018-05-10 DIAGNOSIS — M109 Gout, unspecified: Secondary | ICD-10-CM

## 2018-05-10 MED ORDER — NIACIN ER (ANTIHYPERLIPIDEMIC) 1000 MG PO TBCR: EXTENDED_RELEASE_TABLET | ORAL | 5 refills | Status: DC

## 2018-05-10 NOTE — Patient Instructions (Signed)
Continue all of your current medications. Contact your pharmacy when other refills are needed (some will be due the end of February).  You are tender over your left trochanteric bursa.  I have no records from Dr. Alvan Dame to know about your hip pain, or x-ray results, but you may also have a component of bursitis.  This is treated differently (with a cortisone shot), and you may not need to wait for your dental clearance to try a cortisone shot for the pain you have at the SIDE of your hip (where I pressed and you said it hurt).  You gained 14 pounds since your last visit.  I know your exercise is limited, which means you need to pay more attention to healthy food choices and portion control. If you continue to gain, it will only make your hip and knee pain worse.

## 2018-05-11 LAB — COMPREHENSIVE METABOLIC PANEL
ALT: 27 IU/L (ref 0–44)
AST: 28 IU/L (ref 0–40)
Albumin/Globulin Ratio: 1.5 (ref 1.2–2.2)
Albumin: 4.4 g/dL (ref 3.6–4.8)
Alkaline Phosphatase: 64 IU/L (ref 39–117)
BUN/Creatinine Ratio: 20 (ref 10–24)
BUN: 17 mg/dL (ref 8–27)
Bilirubin Total: 0.5 mg/dL (ref 0.0–1.2)
CO2: 22 mmol/L (ref 20–29)
Calcium: 9.2 mg/dL (ref 8.6–10.2)
Chloride: 103 mmol/L (ref 96–106)
Creatinine, Ser: 0.86 mg/dL (ref 0.76–1.27)
GFR calc Af Amer: 104 mL/min/{1.73_m2} (ref >59)
GFR calc non Af Amer: 90 mL/min/{1.73_m2} (ref >59)
Globulin, Total: 2.9 g/dL (ref 1.5–4.5)
Glucose: 98 mg/dL (ref 65–99)
Potassium: 3.9 mmol/L (ref 3.5–5.2)
Sodium: 137 mmol/L (ref 134–144)
Total Protein: 7.3 g/dL (ref 6.0–8.5)

## 2018-05-11 LAB — FERRITIN: Ferritin: 39 ng/mL (ref 30–400)

## 2018-05-11 LAB — CBC WITH DIFFERENTIAL/PLATELET
BASOS: 1 %
Basophils Absolute: 0.1 10*3/uL (ref 0.0–0.2)
EOS (ABSOLUTE): 0.4 10*3/uL (ref 0.0–0.4)
Eos: 6 %
Hematocrit: 41.9 % (ref 37.5–51.0)
Hemoglobin: 13.8 g/dL (ref 13.0–17.7)
Immature Grans (Abs): 0 10*3/uL (ref 0.0–0.1)
Immature Granulocytes: 0 %
Lymphocytes Absolute: 2.4 10*3/uL (ref 0.7–3.1)
Lymphs: 33 %
MCH: 28.4 pg (ref 26.6–33.0)
MCHC: 32.9 g/dL (ref 31.5–35.7)
MCV: 86 fL (ref 79–97)
Monocytes Absolute: 0.7 10*3/uL (ref 0.1–0.9)
Monocytes: 10 %
NEUTROS ABS: 3.7 10*3/uL (ref 1.4–7.0)
Neutrophils: 50 %
Platelets: 179 10*3/uL (ref 150–450)
RBC: 4.86 x10E6/uL (ref 4.14–5.80)
RDW: 14.6 % (ref 12.3–15.4)
WBC: 7.3 10*3/uL (ref 3.4–10.8)

## 2018-05-11 LAB — IRON: Iron: 61 ug/dL (ref 38–169)

## 2018-05-11 LAB — TSH: TSH: 2.66 u[IU]/mL (ref 0.450–4.500)

## 2018-06-04 ENCOUNTER — Telehealth: Payer: Self-pay

## 2018-06-04 NOTE — Telephone Encounter (Signed)
   [facility] Pre-operative Risk Assessment    Request for surgical clearance:  1. What type of surgery is being performed? Left total hip replacement   2. When is this surgery scheduled? 07/10/2018   3. What type of clearance is required (medical clearance vs. Pharmacy clearance to hold med vs. Both)? Pharmacy  4. Are there any medications that need to be held prior to surgery and how long? Xarelto/7 days prior to surgery   5. Practice name and name of physician performing surgery? [facility]/Dr. Alvan Dame   6. What is your office phone number [phone]    7.   What is your office fax number [phone]  8.   Anesthesia type (None, local, MAC, general) ? Spinal   Timothy Estes 06/04/2018, 1:53 PM  _________________________________________________________________   (provider comments below)

## 2018-06-05 NOTE — Telephone Encounter (Signed)
Patient with diagnosis of Afib with noted clotting disorder on Xarelto for anticoagulation.    Procedure: left total hip Date of procedure: 07/10/18  CHADS2-VASc score of  3 (CHF, HTN, AGE, DM2, stroke/tia x 2, CAD, AGE, male)  CrCl 134ml/min  Per office protocol, patient can hold Xarelto for 3 days prior to procedure.

## 2018-06-18 NOTE — H&P (Signed)
TOTAL HIP ADMISSION H&P  Patient is admitted for left total hip arthroplasty, anterior approach.  Subjective:  Chief Complaint:    Left hip primary OA / pain  HPI: Timothy Estes, 68 y.o. male, has a history of pain and functional disability in the left hip(s) due to arthritis and patient has failed non-surgical conservative treatments for greater than 12 weeks to include NSAID's and/or analgesics and activity modification.  Onset of symptoms was gradual starting 2+ years ago with gradually worsening course since that time.The patient noted no past surgery on the left hip(s).  Patient currently rates pain in the left hip at 6 out of 10 with activity. Patient has night pain, worsening of pain with activity and weight bearing, trendelenberg gait, pain that interfers with activities of daily living and pain with passive range of motion. Patient has evidence of periarticular osteophytes and joint space narrowing by imaging studies. This condition presents safety issues increasing the risk of falls.   There is no current active infection.  Risks, benefits and expectations were discussed with the patient.  Risks including but not limited to the risk of anesthesia, blood clots, nerve damage, blood vessel damage, failure of the prosthesis, infection and up to and including death.  Patient understand the risks, benefits and expectations and wishes to proceed with surgery.   PCP: Rita Ohara, MD  D/C Plans:       Home   Post-op Meds:       No Rx given  Tranexamic Acid:      To be given - IV   Decadron:      Is to be given  FYI:      Xarelto (on pre-op)  Norco  DME:   Pt already has equipment  PT:   No PT    Patient Active Problem List   Diagnosis Date Noted  . Iron deficiency anemia 11/08/2017  . Acute gouty arthritis 10/11/2016  . Impaired fasting glucose 07/24/2014  . Mixed hyperlipidemia 07/10/2012  . Elevated LFTs 04/01/2011  . NSTEMI (non-ST elevated myocardial infarction) ([place]) 04/01/2011   . Atrial fibrillation ([place]) 04/01/2011  . HTN (hypertension) 04/01/2011  . HLD (hyperlipidemia) 04/01/2011  . Nonobstructive CAD by Cath 02/2011 04/01/2011   Past Medical History:  Diagnosis Date  . Anemia   . Arthritis   . Atrial fibrillation with RVR ([place])    converted to NSR with IV dilt 10/12 - no coumadin due to need for Plavix and ASA  . Cataract   . Clotting disorder ([place])    PE 2012  . Colon polyps  10/2015   (non-adenomatous)  . Coronary artery disease    LHC 03/17/11: Dx 30%, OM1 30%, OM2 30%, pRCA 30%, EF 40-45% with anterolat and apical HK  . Diverticulosis  10/2015   noted on colonoscopy  . Elevated LFTs   . Fatty liver   . Hematochezia    LIKEY FROM HEMORRHOIDAL BLEEDING; PT HAS H/O  . Hyperlipidemia   . Hypertension   . Internal hemorrhoids  10/2015   noted on colonoscopy  . NSTEMI (non-ST elevated myocardial infarction) ([place])    2012-FLET THAT NON-ST-ELEVATION MI IS POSSIBLY SECONDARY TO CORONARY EMBOLUS FROM HIS AFIB  . Syncope    in setting of AFib with RVR    Past Surgical History:  Procedure Laterality Date  . CARDIAC CATHETERIZATION  03/18/11  . cardiac event monitor  04/01/11  . CATARACT EXTRACTION W/PHACO Left 09/18/2014   Procedure: CATARACT EXTRACTION PHACO AND INTRAOCULAR LENS PLACEMENT LEFT  EYE;  Surgeon: Tonny Branch, MD;  Location: [facility];  Service: Ophthalmology;  Laterality: Left;  CDE 13.32  . COLONOSCOPY    . TRANSTHORACIC ECHOCARDIOGRAM  03/19/11    No current facility-administered medications for this encounter.    Current Outpatient Medications  Medication Sig Dispense Refill Last Dose  . allopurinol (ZYLOPRIM) 300 MG tablet TAKE 1 TABLET (300 MG TOTAL) DAILY BY MOUTH. 90 tablet 0 Taking  . aspirin EC 81 MG tablet Take 1 tablet (81 mg total) by mouth daily.   Taking  . cholecalciferol (VITAMIN D) 1000 units tablet Take 1,000 Units by mouth daily.   Taking  . COENZYME Q-10 PO Take 1 tablet by mouth daily.   Taking  . colchicine 0.6 MG  tablet Take 2 by mouth at onset of flare and then once daily if needed. (Patient not taking: Reported on 05/10/2018) 30 tablet 0 Not Taking  . ferrous sulfate 325 (65 FE) MG tablet TAKE 1 TABLET (325 MG TOTAL) BY MOUTH 3 (THREE) TIMES DAILY WITH MEALS. 90 tablet 1 Taking  . fish oil-omega-3 fatty acids 1000 MG capsule Take 4 g by mouth daily.    Taking  . metoprolol tartrate (LOPRESSOR) 25 MG tablet TAKE 1 TABLET BY MOUTH TWICE A DAY 180 tablet 0 Taking  . niacin (NIASPAN) 1000 MG CR tablet TAKE 1 TABLET (1,000 MG TOTAL) BY MOUTH AT BEDTIME. 30 tablet 5   . nitroGLYCERIN (NITROSTAT) 0.4 MG SL tablet Place 1 tablet (0.4 mg total) every 5 (five) minutes as needed under the tongue for chest pain. (Patient not taking: Reported on 01/01/2018) 28 tablet 1 Not Taking  . rosuvastatin (CRESTOR) 40 MG tablet TAKE 1 TABLET (40 MG TOTAL) DAILY BY MOUTH. 90 tablet 0 Taking  . XARELTO 20 MG TABS tablet TAKE 1 TABLET (20 MG TOTAL) BY MOUTH DAILY WITH SUPPER. 90 tablet 1 Taking   No Known Allergies  Social History   Tobacco Use  . Smoking status: Former Smoker    Packs/day: 0.50    Years: 3.00    Pack years: 1.50    Types: Cigarettes    Last attempt to quit: 11/24/1974    Years since quitting: 43.5  . Smokeless tobacco: Never Used  Substance Use Topics  . Alcohol use: Yes    Alcohol/week: 0.0 standard drinks    Comment: 1-2 beers every other day, about 6/week    Family History  Problem Relation Age of Onset  . Hyperlipidemia Brother   . Hypertension Brother   . Arthritis Mother   . Heart disease Neg Hx   . Cancer Neg Hx   . Diabetes Neg Hx   . Colon cancer Neg Hx   . Esophageal cancer Neg Hx   . Rectal cancer Neg Hx   . Stomach cancer Neg Hx      Review of Systems  Constitutional: Negative.   HENT: Negative.   Eyes: Negative.   Respiratory: Negative.   Cardiovascular: Negative.   Gastrointestinal: Negative.   Genitourinary: Negative.   Musculoskeletal: Positive for joint pain.  Skin:  Negative.   Neurological: Negative.   Endo/Heme/Allergies: Negative.   Psychiatric/Behavioral: Negative.     Objective:  Physical Exam  Constitutional: He is oriented to person, place, and time. He appears well-developed.  HENT:  Head: Normocephalic.  Eyes: Pupils are equal, round, and reactive to light.  Neck: Neck supple. No JVD present. No tracheal deviation present. No thyromegaly present.  Cardiovascular: Normal rate, regular rhythm and intact distal pulses.  hx a-fib  Respiratory: Effort normal and breath sounds normal. No respiratory distress. He has no wheezes.  GI: Soft. There is no abdominal tenderness. There is no guarding.  Musculoskeletal:     Left hip: He exhibits decreased range of motion, decreased strength, tenderness and bony tenderness. He exhibits no swelling, no deformity and no laceration.  Lymphadenopathy:    He has no cervical adenopathy.  Neurological: He is alert and oriented to person, place, and time.  Skin: Skin is warm and dry.  Psychiatric: He has a normal mood and affect.      Labs:  Estimated body mass index is 33.7 kg/m as calculated from the following:   Height as of 05/10/18: 5\' 9"  (1.753 m).   Weight as of 05/10/18: 103.5 kg.   Imaging Review Plain radiographs demonstrate severe degenerative joint disease of the left hip. The bone quality appears to be good for age and reported activity level.    Preoperative templating of the joint replacement has been completed, documented, and submitted to the Operating Room personnel in order to optimize intra-operative equipment management.     Assessment/Plan:  End stage arthritis, left hip  The patient history, physical examination, clinical judgement of the provider and imaging studies are consistent with end stage degenerative joint disease of the left hip and total hip arthroplasty is deemed medically necessary. The treatment options including medical management, injection therapy,  arthroscopy and arthroplasty were discussed at length. The risks and benefits of total hip arthroplasty were presented and reviewed. The risks due to aseptic loosening, infection, stiffness, dislocation/subluxation,  thromboembolic complications and other imponderables were discussed.  The patient acknowledged the explanation, agreed to proceed with the plan and consent was signed. Patient is being admitted for inpatient treatment for surgery, pain control, PT, OT, prophylactic antibiotics, VTE prophylaxis, progressive ambulation and ADL's and discharge planning.The patient is planning to be discharged home.     [place] Fritz Cauthon   PA-C  06/18/2018, 11:36 AM

## 2018-07-04 ENCOUNTER — Encounter (HOSPITAL_COMMUNITY)
Admission: RE | Admit: 2018-07-04 | Discharge: 2018-07-04 | Disposition: A | Discharge status: Home / Self Care | Payer: Medicare Other | Source: Ambulatory Visit | Attending: Orthopedic Surgery | Admitting: Orthopedic Surgery

## 2018-07-04 ENCOUNTER — Encounter (HOSPITAL_COMMUNITY): Payer: Self-pay

## 2018-07-04 ENCOUNTER — Other Ambulatory Visit: Payer: Self-pay

## 2018-07-04 DIAGNOSIS — Z01812 Encounter for preprocedural laboratory examination: Secondary | ICD-10-CM | POA: Diagnosis present

## 2018-07-04 LAB — BASIC METABOLIC PANEL
Anion gap: 9 (ref 5–15)
BUN: 17 mg/dL (ref 8–23)
CO2: 23 mmol/L (ref 22–32)
Calcium: 9.3 mg/dL (ref 8.9–10.3)
Chloride: 107 mmol/L (ref 98–111)
Creatinine, Ser: 0.86 mg/dL (ref 0.61–1.24)
GFR calc Af Amer: >60 mL/min (ref >60)
GFR calc non Af Amer: >60 mL/min (ref >60)
GLUCOSE: 96 mg/dL (ref 70–99)
Potassium: 4.3 mmol/L (ref 3.5–5.1)
Sodium: 139 mmol/L (ref 135–145)

## 2018-07-04 LAB — SURGICAL PCR SCREEN
MRSA, PCR: POSITIVE — AB
Staphylococcus aureus: POSITIVE — AB

## 2018-07-04 LAB — ABO/RH: ABO/RH(D): A NEG

## 2018-07-04 LAB — CBC
HCT: 44.3 % (ref 39.0–52.0)
Hemoglobin: 14.6 g/dL (ref 13.0–17.0)
MCH: 29 pg (ref 26.0–34.0)
MCHC: 33 g/dL (ref 30.0–36.0)
MCV: 88.1 fL (ref 80.0–100.0)
Platelets: 171 10*3/uL (ref 150–400)
RBC: 5.03 MIL/uL (ref 4.22–5.81)
RDW: 14.5 % (ref 11.5–15.5)
WBC: 7.1 10*3/uL (ref 4.0–10.5)
nRBC: 0 % (ref 0.0–0.2)

## 2018-07-04 NOTE — Patient Instructions (Signed)
Curtez Brallier  07/04/2018   Your procedure is scheduled on:  Tuesday 07/10/2018  Report to [facility] Main  Entrance              Report to admitting at  0600  AM    Call this number if you have problems the morning of surgery [phone]    Remember: Do not eat food or drink liquids :After Midnight.              BRUSH YOUR TEETH MORNING OF SURGERY AND RINSE YOUR MOUTH OUT, NO CHEWING GUM CANDY OR MINTS.     Take these medicines the morning of surgery with A SIP OF WATER: Metoprolol tartrate (Lopressor), Rosuvastatin (Crestor), Allopurinol (Zyloprim)                                   You may not have any metal on your body including hair pins and              piercings  Do not wear jewelry, make-up, lotions, powders or perfumes, deodorant             Men may shave face and neck.   Do not bring valuables to the hospital. [place].  Contacts, dentures or bridgework may not be worn into surgery.  Leave suitcase in the car. After surgery it may be brought to your room.                  Please read over the following fact sheets you were given: _____________________________________________________________________             [facility] - Preparing for Surgery Before surgery, you can play an important role.  Because skin is not sterile, your skin needs to be as free of germs as possible.  You can reduce the number of germs on your skin by washing with CHG (chlorahexidine gluconate) soap before surgery.  CHG is an antiseptic cleaner which kills germs and bonds with the skin to continue killing germs even after washing. Please DO NOT use if you have an allergy to CHG or antibacterial soaps.  If your skin becomes reddened/irritated stop using the CHG and inform your nurse when you arrive at Short Stay. Do not shave (including legs and underarms) for at least 48 hours prior to the first CHG shower.  You may  shave your face/neck. Please follow these instructions carefully:  1.  Shower with CHG Soap the night before surgery and the  morning of Surgery.  2.  If you choose to wash your hair, wash your hair first as usual with your  normal  shampoo.  3.  After you shampoo, rinse your hair and body thoroughly to remove the  shampoo.                           4.  Use CHG as you would any other liquid soap.  You can apply chg directly  to the skin and wash                       Gently with a scrungie or clean washcloth.  5.  Apply the  CHG Soap to your body ONLY FROM THE NECK DOWN.   Do not use on face/ open                           Wound or open sores. Avoid contact with eyes, ears mouth and genitals (private parts).                       Wash face,  Genitals (private parts) with your normal soap.             6.  Wash thoroughly, paying special attention to the area where your surgery  will be performed.  7.  Thoroughly rinse your body with warm water from the neck down.  8.  DO NOT shower/wash with your normal soap after using and rinsing off  the CHG Soap.                9.  Pat yourself dry with a clean towel.            10.  Wear clean pajamas.            11.  Place clean sheets on your bed the night of your first shower and do not  sleep with pets. Day of Surgery : Do not apply any lotions/deodorants the morning of surgery.  Please wear clean clothes to the hospital/surgery center.  FAILURE TO FOLLOW THESE INSTRUCTIONS MAY RESULT IN THE CANCELLATION OF YOUR SURGERY PATIENT SIGNATURE_________________________________  NURSE SIGNATURE__________________________________  ________________________________________________________________________   Adam Phenix  An incentive spirometer is a tool that can help keep your lungs clear and active. This tool measures how well you are filling your lungs with each breath. Taking long deep breaths may help reverse or decrease the chance of developing  breathing (pulmonary) problems (especially infection) following:  A long period of time when you are unable to move or be active. BEFORE THE PROCEDURE   If the spirometer includes an indicator to show your best effort, your nurse or respiratory therapist will set it to a desired goal.  If possible, sit up straight or lean slightly forward. Try not to slouch.  Hold the incentive spirometer in an upright position. INSTRUCTIONS FOR USE  1. Sit on the edge of your bed if possible, or sit up as far as you can in bed or on a chair. 2. Hold the incentive spirometer in an upright position. 3. Breathe out normally. 4. Place the mouthpiece in your mouth and seal your lips tightly around it. 5. Breathe in slowly and as deeply as possible, raising the piston or the ball toward the top of the column. 6. Hold your breath for 3-5 seconds or for as long as possible. Allow the piston or ball to fall to the bottom of the column. 7. Remove the mouthpiece from your mouth and breathe out normally. 8. Rest for a few seconds and repeat Steps 1 through 7 at least 10 times every 1-2 hours when you are awake. Take your time and take a few normal breaths between deep breaths. 9. The spirometer may include an indicator to show your best effort. Use the indicator as a goal to work toward during each repetition. 10. After each set of 10 deep breaths, practice coughing to be sure your lungs are clear. If you have an incision (the cut made at the time of surgery), support your incision when coughing by placing a pillow or rolled up  towels firmly against it. Once you are able to get out of bed, walk around indoors and cough well. You may stop using the incentive spirometer when instructed by your caregiver.  RISKS AND COMPLICATIONS  Take your time so you do not get dizzy or light-headed.  If you are in pain, you may need to take or ask for pain medication before doing incentive spirometry. It is harder to take a deep  breath if you are having pain. AFTER USE  Rest and breathe slowly and easily.  It can be helpful to keep track of a log of your progress. Your caregiver can provide you with a simple table to help with this. If you are using the spirometer at home, follow these instructions: [place] IF:   You are having difficultly using the spirometer.  You have trouble using the spirometer as often as instructed.  Your pain medication is not giving enough relief while using the spirometer.  You develop fever of 100.5 F (38.1 C) or higher. SEEK IMMEDIATE MEDICAL CARE IF:   You cough up bloody sputum that had not been present before.  You develop fever of 102 F (38.9 C) or greater.  You develop worsening pain at or near the incision site. MAKE SURE YOU:   Understand these instructions.  Will watch your condition.  Will get help right away if you are not doing well or get worse. Document Released: 09/19/2006 Document Revised: 08/01/2011 Document Reviewed: 11/20/2006 ExitCare Patient Information 2014 Exit[employer], [place].   ________________________________________________________________________  WHAT IS A BLOOD TRANSFUSION? Blood Transfusion Information  A transfusion is the replacement of blood or some of its parts. Blood is made up of multiple cells which provide different functions.  Red blood cells carry oxygen and are used for blood loss replacement.  White blood cells fight against infection.  Platelets control bleeding.  Plasma helps clot blood.  Other blood products are available for specialized needs, such as hemophilia or other clotting disorders. BEFORE THE TRANSFUSION  Who gives blood for transfusions?   Healthy volunteers who are fully evaluated to make sure their blood is safe. This is blood bank blood. Transfusion therapy is the safest it has ever been in the practice of medicine. Before blood is taken from a donor, a complete history is taken to make sure  that person has no history of diseases nor engages in risky social behavior (examples are intravenous drug use or sexual activity with multiple partners). The donor's travel history is screened to minimize risk of transmitting infections, such as malaria. The donated blood is tested for signs of infectious diseases, such as HIV and hepatitis. The blood is then tested to be sure it is compatible with you in order to minimize the chance of a transfusion reaction. If you or a relative donates blood, this is often done in anticipation of surgery and is not appropriate for emergency situations. It takes many days to process the donated blood. RISKS AND COMPLICATIONS Although transfusion therapy is very safe and saves many lives, the main dangers of transfusion include:   Getting an infectious disease.  Developing a transfusion reaction. This is an allergic reaction to something in the blood you were given. Every precaution is taken to prevent this. The decision to have a blood transfusion has been considered carefully by your caregiver before blood is given. Blood is not given unless the benefits outweigh the risks. AFTER THE TRANSFUSION  Right after receiving a blood transfusion, you will usually feel much  better and more energetic. This is especially true if your red blood cells have gotten low (anemic). The transfusion raises the level of the red blood cells which carry oxygen, and this usually causes an energy increase.  The nurse administering the transfusion will monitor you carefully for complications. HOME CARE INSTRUCTIONS  No special instructions are needed after a transfusion. You may find your energy is better. Speak with your caregiver about any limitations on activity for underlying diseases you may have. SEEK MEDICAL CARE IF:   Your condition is not improving after your transfusion.  You develop redness or irritation at the intravenous (IV) site. SEEK IMMEDIATE MEDICAL CARE IF:  Any of  the following symptoms occur over the next 12 hours:  Shaking chills.  You have a temperature by mouth above 102 F (38.9 C), not controlled by medicine.  Chest, back, or muscle pain.  People around you feel you are not acting correctly or are confused.  Shortness of breath or difficulty breathing.  Dizziness and fainting.  You get a rash or develop hives.  You have a decrease in urine output.  Your urine turns a dark color or changes to pink, red, or brown. Any of the following symptoms occur over the next 10 days:  You have a temperature by mouth above 102 F (38.9 C), not controlled by medicine.  Shortness of breath.  Weakness after normal activity.  The white part of the eye turns yellow (jaundice).  You have a decrease in the amount of urine or are urinating less often.  Your urine turns a dark color or changes to pink, red, or brown. Document Released: 05/06/2000 Document Revised: 08/01/2011 Document Reviewed: 12/24/2007 [facility] Patient Information 2014 [place], [place].  _______________________________________________________________________

## 2018-07-04 NOTE — Progress Notes (Signed)
06/11/2018- Medical Clearance from Dr. Tomi Bamberger on chart   06/05/2018- Cardiac Clearance from [place] , [facility] on chart  05/10/2018-  Office note from Dr. Tomi Bamberger on chart and lab worj- CMP, CBC w/diff., Ferritin, TSH, Iron on chart.09/22/2017- noted in Epic-Office note from Dr. Cristopher Peru and EKG

## 2018-07-05 NOTE — Progress Notes (Signed)
Anesthesia Chart Review   Case:  [id number] Date/Time:  07/10/18 0825   Procedure:  TOTAL HIP ARTHROPLASTY ANTERIOR APPROACH (Left ) - 70 mins   Anesthesia type:  Spinal   Pre-op diagnosis:  Left hip osteoarthritis   Location:  [place] [facility] / [facility]   Surgeon:  Timothy Cancel, MD      DISCUSSION: 68 yo former smoker (1.5 pack years, quit 11/24/74) with h/o CAD, HLD, PE (2012), A-fib (on Xarelto), HTN, NSTEMI (2012), anemia, left hip OA scheduled for above surgery 07/10/18 with Dr. Paralee Estes.   Pt last seen by PCP, Dr. Rita Estes, on 05/10/18.  Her her note pt checks BP at home every other day with normal readings.  At PST visit 07/04/18 BP 198/113.  Discussed importance of well controlled BP prior to surgery.  Surgeon made aware.  Will evaluate DOS.  Per Dr. Tomi Estes clearance is needed from Cardiologist.  Dr. Aurea Estes scheduler contacted to see if this has been requested.   Addendum 07/09/2018:  Cardiac Clearance received 07/06/2018.  Per Timothy Sharp, PA-C, "Timothy Estes was last seen on 09/22/17 by Dr. Lovena Estes.  Since that day, Timothy Estes has done well. His mobility is limited by hip pain, but he can complete more than 4.0 METS. He denies any changes in his medical history. He denies cardiac complaints, including anginal symptoms. Per office protocol, patient can holdXareltofor 3days prior to procedure.  Therefore, based on ACC/AHA guidelines, the patient would be at acceptable risk for the planned procedure without further cardiovascular testing."   Pt can proceed with planned procedure as long as BP improved DOS. VS: BP (!) 198/113   Pulse 73   Temp 36.5 C (Oral)   Resp 18   Ht 5\' 10"  (1.778 m)   Wt 104.7 kg   SpO2 98%   BMI 33.12 kg/m   PROVIDERS: Timothy Ohara, MD is PCP   Timothy Peru, MD is Cardiologist  LABS: Labs reviewed: Acceptable for surgery. (all labs ordered are listed, but only abnormal results are displayed)  Labs Reviewed  SURGICAL PCR SCREEN - Abnormal; Notable for the  following components:      Result Value   MRSA, PCR POSITIVE (*)    Staphylococcus aureus POSITIVE (*)    All other components within normal limits  BASIC METABOLIC PANEL  CBC  TYPE AND SCREEN  ABO/RH     IMAGES:   EKG: 09/22/2017 Rate 71 bpm Normal sinus rhythm Abnormal ECG   CV:  Past Medical History:  Diagnosis Date  . Anemia   . Arthritis   . Atrial fibrillation with RVR ([place])    converted to NSR with IV dilt 10/12 - no coumadin due to need for Plavix and ASA  . Cataract   . Clotting disorder ([facility])    PE 2012  . Colon polyps  10/2015   (non-adenomatous)  . Coronary artery disease    LHC 03/17/11: Dx 30%, OM1 30%, OM2 30%, pRCA 30%, EF 40-45% with anterolat and apical HK  . Diverticulosis  10/2015   noted on colonoscopy  . Elevated LFTs   . Fatty liver   . Hematochezia    LIKEY FROM HEMORRHOIDAL BLEEDING; PT HAS H/O  . Hyperlipidemia   . Hypertension   . Internal hemorrhoids  10/2015   noted on colonoscopy  . NSTEMI (non-ST elevated myocardial infarction) ([place])    2012-[facility] THAT NON-ST-ELEVATION MI IS POSSIBLY SECONDARY TO CORONARY EMBOLUS FROM HIS AFIB  . Syncope    in setting  of AFib with RVR    Past Surgical History:  Procedure Laterality Date  . CARDIAC CATHETERIZATION  03/18/11  . cardiac event monitor  04/01/11  . CATARACT EXTRACTION W/PHACO Left 09/18/2014   Procedure: CATARACT EXTRACTION PHACO AND INTRAOCULAR LENS PLACEMENT LEFT EYE;  Surgeon: Tonny Branch, MD;  Location: [facility];  Service: Ophthalmology;  Laterality: Left;  CDE 13.32  . COLONOSCOPY    . ESOPHAGOGASTRODUODENOSCOPY ENDOSCOPY    . MOUTH SURGERY    . TRANSTHORACIC ECHOCARDIOGRAM  03/19/11  . WISDOM TOOTH EXTRACTION      MEDICATIONS: . allopurinol (ZYLOPRIM) 300 MG tablet  . aspirin EC 81 MG tablet  . cholecalciferol (VITAMIN D) 1000 units tablet  . Coenzyme Q-10 100 MG capsule  . fish oil-omega-3 fatty acids 1000 MG capsule  . metoprolol tartrate (LOPRESSOR) 25 MG tablet  .  niacin (NIASPAN) 1000 MG CR tablet  . nitroGLYCERIN (NITROSTAT) 0.4 MG SL tablet  . rosuvastatin (CRESTOR) 40 MG tablet  . XARELTO 20 MG TABS tablet   No current facility-administered medications for this encounter.

## 2018-07-05 NOTE — Telephone Encounter (Signed)
Left message for the patient to call back and speak to the on call preop APP. Given the timing of the procedure, Callback pool also try to contact the patient tomorrow as well.

## 2018-07-05 NOTE — Telephone Encounter (Signed)
° ° °  [facility] calling for status of medical clearance.  Updated visit info to Standard Work format

## 2018-07-06 ENCOUNTER — Telehealth: Payer: Self-pay | Admitting: Internal Medicine

## 2018-07-06 NOTE — Telephone Encounter (Signed)
Follow Up: ° ° ° ° °Returning call from yesterday. °

## 2018-07-06 NOTE — Telephone Encounter (Signed)
   Primary Cardiologist: Cristopher Peru, MD  Chart reviewed as part of pre-operative protocol coverage. Patient was contacted 07/06/2018 in reference to pre-operative risk assessment for pending surgery as outlined below.  Timothy Estes was last seen on 09/22/17 by Dr. Lovena Le.  Since that day, Timothy Estes has done well. His mobility is limited by hip pain, but he can complete more than 4.0 METS. He denies any changes in his medical history. He denies cardiac complaints, including anginal symptoms.   Per our pharmacy staff: Patient with diagnosis of Afib with noted clotting disorder on Xarelto for anticoagulation.    Procedure: left total hip Date of procedure: 07/10/18  CHADS2-VASc score of  3 (CHF, HTN, AGE, DM2, stroke/tia x 2, CAD, AGE, male)  CrCl 176ml/min  Per office protocol, patient can hold Xarelto for 3 days prior to procedure.     Therefore, based on ACC/AHA guidelines, the patient would be at acceptable risk for the planned procedure without further cardiovascular testing.   I will route this recommendation to the requesting party via Epic fax function and remove from pre-op pool.  Please call with questions.  [place], PA 07/06/2018, 10:08 AM

## 2018-07-08 ENCOUNTER — Other Ambulatory Visit: Payer: Self-pay | Admitting: Family Medicine

## 2018-07-08 DIAGNOSIS — I10 Essential (primary) hypertension: Secondary | ICD-10-CM

## 2018-07-08 DIAGNOSIS — I2510 Atherosclerotic heart disease of native coronary artery without angina pectoris: Secondary | ICD-10-CM

## 2018-07-09 MED ORDER — VANCOMYCIN HCL 10 G IV SOLR
1500.0000 mg | INTRAVENOUS | Status: AC
  Administered 2018-07-10: 1500 mg via INTRAVENOUS
  Filled 2018-07-09: qty 1500

## 2018-07-09 NOTE — Anesthesia Preprocedure Evaluation (Addendum)
Anesthesia Evaluation  Patient identified by MRN, date of birth, ID band Patient awake    Reviewed: Allergy & Precautions, NPO status , Patient's Chart, lab work & pertinent test results, reviewed documented beta blocker date and time   Airway Mallampati: III  TM Distance: >3 FB Neck ROM: Full    Dental  (+) Dental Advisory Given   Pulmonary former smoker,    breath sounds clear to auscultation       Cardiovascular hypertension, Pt. on medications and Pt. on home beta blockers + CAD and + Past MI  + dysrhythmias Atrial Fibrillation  Rhythm:Regular Rate:Normal     Neuro/Psych negative neurological ROS     GI/Hepatic negative GI ROS, Neg liver ROS,   Endo/Other  negative endocrine ROS  Renal/GU negative Renal ROS     Musculoskeletal  (+) Arthritis ,   Abdominal   Peds  Hematology  (+) Blood dyscrasia (last dose xarelto 2/14), ,   Anesthesia Other Findings   Reproductive/Obstetrics                           Lab Results  Component Value Date   WBC 7.1 07/04/2018   HGB 14.6 07/04/2018   HCT 44.3 07/04/2018   MCV 88.1 07/04/2018   PLT 171 07/04/2018   Lab Results  Component Value Date   CREATININE 0.86 07/04/2018   BUN 17 07/04/2018   NA 139 07/04/2018   K 4.3 07/04/2018   CL 107 07/04/2018   CO2 23 07/04/2018    Anesthesia Physical Anesthesia Plan  ASA: III  Anesthesia Plan: Spinal and MAC   Post-op Pain Management:    Induction: Intravenous  PONV Risk Score and Plan: 1 and Propofol infusion, Ondansetron and Treatment may vary due to age or medical condition  Airway Management Planned: Natural Airway and Simple Face Mask  Additional Equipment:   Intra-op Plan:   Post-operative Plan:   Informed Consent: I have reviewed the patients History and Physical, chart, labs and discussed the procedure including the risks, benefits and alternatives for the proposed anesthesia  with the patient or authorized representative who has indicated his/her understanding and acceptance.       Plan Discussed with: CRNA  Anesthesia Plan Comments:       Anesthesia Quick Evaluation

## 2018-07-10 ENCOUNTER — Inpatient Hospital Stay (HOSPITAL_COMMUNITY): Payer: Medicare Other | Admitting: Physician Assistant

## 2018-07-10 ENCOUNTER — Inpatient Hospital Stay (HOSPITAL_COMMUNITY): Payer: Medicare Other | Admitting: Anesthesiology

## 2018-07-10 ENCOUNTER — Encounter (HOSPITAL_COMMUNITY): Payer: Self-pay | Admitting: *Deleted

## 2018-07-10 ENCOUNTER — Inpatient Hospital Stay (HOSPITAL_COMMUNITY): Payer: Medicare Other

## 2018-07-10 ENCOUNTER — Other Ambulatory Visit: Payer: Self-pay

## 2018-07-10 ENCOUNTER — Inpatient Hospital Stay (HOSPITAL_COMMUNITY)
Admission: RE | Admit: 2018-07-10 | Discharge: 2018-07-11 | DRG: 470 | Disposition: A | Discharge status: Home / Self Care | Payer: Medicare Other | Attending: Orthopedic Surgery | Admitting: Orthopedic Surgery

## 2018-07-10 ENCOUNTER — Encounter (HOSPITAL_COMMUNITY)
Admission: RE | Disposition: A | Discharge status: Home / Self Care | Payer: Self-pay | Source: Home / Self Care | Attending: Orthopedic Surgery

## 2018-07-10 DIAGNOSIS — Z8719 Personal history of other diseases of the digestive system: Secondary | ICD-10-CM | POA: Diagnosis not present

## 2018-07-10 DIAGNOSIS — D759 Disease of blood and blood-forming organs, unspecified: Secondary | ICD-10-CM | POA: Diagnosis present

## 2018-07-10 DIAGNOSIS — Z87891 Personal history of nicotine dependence: Secondary | ICD-10-CM | POA: Diagnosis not present

## 2018-07-10 DIAGNOSIS — Z8261 Family history of arthritis: Secondary | ICD-10-CM

## 2018-07-10 DIAGNOSIS — I4891 Unspecified atrial fibrillation: Secondary | ICD-10-CM | POA: Diagnosis present

## 2018-07-10 DIAGNOSIS — Z6833 Body mass index (BMI) 33.0-33.9, adult: Secondary | ICD-10-CM | POA: Diagnosis not present

## 2018-07-10 DIAGNOSIS — M109 Gout, unspecified: Secondary | ICD-10-CM | POA: Diagnosis present

## 2018-07-10 DIAGNOSIS — E782 Mixed hyperlipidemia: Secondary | ICD-10-CM | POA: Diagnosis present

## 2018-07-10 DIAGNOSIS — Z79899 Other long term (current) drug therapy: Secondary | ICD-10-CM

## 2018-07-10 DIAGNOSIS — K648 Other hemorrhoids: Secondary | ICD-10-CM | POA: Diagnosis present

## 2018-07-10 DIAGNOSIS — I2510 Atherosclerotic heart disease of native coronary artery without angina pectoris: Secondary | ICD-10-CM | POA: Diagnosis present

## 2018-07-10 DIAGNOSIS — Z8249 Family history of ischemic heart disease and other diseases of the circulatory system: Secondary | ICD-10-CM

## 2018-07-10 DIAGNOSIS — Z96642 Presence of left artificial hip joint: Secondary | ICD-10-CM

## 2018-07-10 DIAGNOSIS — Z86711 Personal history of pulmonary embolism: Secondary | ICD-10-CM

## 2018-07-10 DIAGNOSIS — Z419 Encounter for procedure for purposes other than remedying health state, unspecified: Secondary | ICD-10-CM

## 2018-07-10 DIAGNOSIS — D689 Coagulation defect, unspecified: Secondary | ICD-10-CM | POA: Diagnosis present

## 2018-07-10 DIAGNOSIS — D509 Iron deficiency anemia, unspecified: Secondary | ICD-10-CM | POA: Diagnosis present

## 2018-07-10 DIAGNOSIS — I252 Old myocardial infarction: Secondary | ICD-10-CM

## 2018-07-10 DIAGNOSIS — Z96649 Presence of unspecified artificial hip joint: Secondary | ICD-10-CM

## 2018-07-10 DIAGNOSIS — Z7982 Long term (current) use of aspirin: Secondary | ICD-10-CM

## 2018-07-10 DIAGNOSIS — E669 Obesity, unspecified: Secondary | ICD-10-CM | POA: Diagnosis present

## 2018-07-10 DIAGNOSIS — I10 Essential (primary) hypertension: Secondary | ICD-10-CM | POA: Diagnosis present

## 2018-07-10 DIAGNOSIS — K5790 Diverticulosis of intestine, part unspecified, without perforation or abscess without bleeding: Secondary | ICD-10-CM | POA: Diagnosis present

## 2018-07-10 DIAGNOSIS — M1612 Unilateral primary osteoarthritis, left hip: Secondary | ICD-10-CM | POA: Diagnosis present

## 2018-07-10 HISTORY — PX: TOTAL HIP ARTHROPLASTY: SHX124

## 2018-07-10 LAB — TYPE AND SCREEN
ABO/RH(D): A NEG
Antibody Screen: NEGATIVE

## 2018-07-10 SURGERY — ARTHROPLASTY, HIP, TOTAL, ANTERIOR APPROACH
Anesthesia: Monitor Anesthesia Care | Laterality: Left

## 2018-07-10 MED ORDER — DOCUSATE SODIUM 100 MG PO CAPS: 100.0000 mg | ORAL_CAPSULE | Freq: Two times a day (BID) | ORAL | 0 refills | Status: DC

## 2018-07-10 MED ORDER — DEXAMETHASONE SODIUM PHOSPHATE 10 MG/ML IJ SOLN
10.0000 mg | Freq: Once | INTRAMUSCULAR | Status: AC
  Administered 2018-07-11: 10 mg via INTRAVENOUS
  Filled 2018-07-10: qty 1

## 2018-07-10 MED ORDER — MAGNESIUM CITRATE PO SOLN: 1.0000 | Freq: Once | ORAL | Status: DC | PRN

## 2018-07-10 MED ORDER — METOPROLOL TARTRATE 25 MG PO TABS
25.0000 mg | ORAL_TABLET | Freq: Two times a day (BID) | ORAL | Status: DC
  Administered 2018-07-10 – 2018-07-11 (×2): 25 mg via ORAL
  Filled 2018-07-10 (×2): qty 1

## 2018-07-10 MED ORDER — HYDROCODONE-ACETAMINOPHEN 7.5-325 MG PO TABS: 1.0000 | ORAL_TABLET | ORAL | Status: DC | PRN

## 2018-07-10 MED ORDER — HYDROMORPHONE HCL 1 MG/ML IJ SOLN: 0.2500 mg | INTRAMUSCULAR | Status: DC | PRN

## 2018-07-10 MED ORDER — DEXAMETHASONE SODIUM PHOSPHATE 10 MG/ML IJ SOLN
INTRAMUSCULAR | Status: DC | PRN
  Administered 2018-07-10: 10 mg via INTRAVENOUS

## 2018-07-10 MED ORDER — TRANEXAMIC ACID-NACL 1000-0.7 MG/100ML-% IV SOLN
1000.0000 mg | INTRAVENOUS | Status: AC
  Administered 2018-07-10: 1000 mg via INTRAVENOUS
  Filled 2018-07-10: qty 100

## 2018-07-10 MED ORDER — HYDROCODONE-ACETAMINOPHEN 7.5-325 MG PO TABS: 1.0000 | ORAL_TABLET | ORAL | 0 refills | Status: DC | PRN

## 2018-07-10 MED ORDER — ONDANSETRON HCL 4 MG/2ML IJ SOLN
INTRAMUSCULAR | Status: DC | PRN
  Administered 2018-07-10: 4 mg via INTRAVENOUS

## 2018-07-10 MED ORDER — SODIUM CHLORIDE 0.9 % IV SOLN
INTRAVENOUS | Status: DC | PRN
  Administered 2018-07-10: 25 ug/min via INTRAVENOUS

## 2018-07-10 MED ORDER — PHENYLEPHRINE HCL 10 MG/ML IJ SOLN
INTRAMUSCULAR | Status: AC
  Filled 2018-07-10: qty 1

## 2018-07-10 MED ORDER — FENTANYL CITRATE (PF) 100 MCG/2ML IJ SOLN
INTRAMUSCULAR | Status: AC
  Filled 2018-07-10: qty 2

## 2018-07-10 MED ORDER — SODIUM CHLORIDE 0.9 % IV SOLN
INTRAVENOUS | Status: DC
  Administered 2018-07-10: 12:00:00 via INTRAVENOUS

## 2018-07-10 MED ORDER — METHOCARBAMOL 500 MG PO TABS: 500.0000 mg | ORAL_TABLET | Freq: Four times a day (QID) | ORAL | 0 refills | Status: DC | PRN

## 2018-07-10 MED ORDER — ROSUVASTATIN CALCIUM 20 MG PO TABS
40.0000 mg | ORAL_TABLET | Freq: Every day | ORAL | Status: DC
  Administered 2018-07-10 – 2018-07-11 (×2): 40 mg via ORAL
  Filled 2018-07-10 (×2): qty 2

## 2018-07-10 MED ORDER — METHOCARBAMOL 500 MG IVPB - SIMPLE MED
500.0000 mg | Freq: Four times a day (QID) | INTRAVENOUS | Status: DC | PRN
  Filled 2018-07-10: qty 50

## 2018-07-10 MED ORDER — FERROUS SULFATE 325 (65 FE) MG PO TABS: 325.0000 mg | ORAL_TABLET | Freq: Three times a day (TID) | ORAL | 3 refills | Status: DC

## 2018-07-10 MED ORDER — ALUM & MAG HYDROXIDE-SIMETH 200-200-20 MG/5ML PO SUSP: 15.0000 mL | ORAL | Status: DC | PRN

## 2018-07-10 MED ORDER — RIVAROXABAN 10 MG PO TABS
10.0000 mg | ORAL_TABLET | ORAL | Status: DC
  Administered 2018-07-11: 10 mg via ORAL
  Filled 2018-07-10: qty 1

## 2018-07-10 MED ORDER — DEXAMETHASONE SODIUM PHOSPHATE 10 MG/ML IJ SOLN
INTRAMUSCULAR | Status: AC
  Filled 2018-07-10: qty 2

## 2018-07-10 MED ORDER — ACETAMINOPHEN 325 MG PO TABS: 325.0000 mg | ORAL_TABLET | Freq: Four times a day (QID) | ORAL | Status: DC | PRN

## 2018-07-10 MED ORDER — POLYETHYLENE GLYCOL 3350 17 G PO PACK
17.0000 g | PACK | Freq: Two times a day (BID) | ORAL | Status: DC
  Administered 2018-07-11: 17 g via ORAL
  Filled 2018-07-10 (×2): qty 1

## 2018-07-10 MED ORDER — PROPOFOL 500 MG/50ML IV EMUL
INTRAVENOUS | Status: DC | PRN
  Administered 2018-07-10: 75 ug/kg/min via INTRAVENOUS

## 2018-07-10 MED ORDER — ROCURONIUM BROMIDE 100 MG/10ML IV SOLN
INTRAVENOUS | Status: AC
  Filled 2018-07-10: qty 1

## 2018-07-10 MED ORDER — ONDANSETRON HCL 4 MG/2ML IJ SOLN
INTRAMUSCULAR | Status: AC
  Filled 2018-07-10: qty 4

## 2018-07-10 MED ORDER — FENTANYL CITRATE (PF) 100 MCG/2ML IJ SOLN
INTRAMUSCULAR | Status: DC | PRN
  Administered 2018-07-10 (×2): 50 ug via INTRAVENOUS

## 2018-07-10 MED ORDER — DOCUSATE SODIUM 100 MG PO CAPS
100.0000 mg | ORAL_CAPSULE | Freq: Two times a day (BID) | ORAL | Status: DC
  Administered 2018-07-10 – 2018-07-11 (×2): 100 mg via ORAL
  Filled 2018-07-10 (×2): qty 1

## 2018-07-10 MED ORDER — TRANEXAMIC ACID-NACL 1000-0.7 MG/100ML-% IV SOLN
1000.0000 mg | Freq: Once | INTRAVENOUS | Status: AC
  Administered 2018-07-10: 1000 mg via INTRAVENOUS
  Filled 2018-07-10: qty 100

## 2018-07-10 MED ORDER — MENTHOL 3 MG MT LOZG: 1.0000 | LOZENGE | OROMUCOSAL | Status: DC | PRN

## 2018-07-10 MED ORDER — HYDROMORPHONE HCL 1 MG/ML IJ SOLN: 0.5000 mg | INTRAMUSCULAR | Status: DC | PRN

## 2018-07-10 MED ORDER — NIACIN ER (ANTIHYPERLIPIDEMIC) 500 MG PO TBCR
1000.0000 mg | EXTENDED_RELEASE_TABLET | Freq: Every day | ORAL | Status: DC
  Administered 2018-07-10: 1000 mg via ORAL
  Filled 2018-07-10: qty 2

## 2018-07-10 MED ORDER — CHLORHEXIDINE GLUCONATE 4 % EX LIQD: 60.0000 mL | Freq: Once | CUTANEOUS | Status: DC

## 2018-07-10 MED ORDER — ALLOPURINOL 300 MG PO TABS
300.0000 mg | ORAL_TABLET | Freq: Every day | ORAL | Status: DC
  Administered 2018-07-11: 300 mg via ORAL
  Filled 2018-07-10: qty 1

## 2018-07-10 MED ORDER — METOCLOPRAMIDE HCL 5 MG/ML IJ SOLN: 5.0000 mg | Freq: Three times a day (TID) | INTRAMUSCULAR | Status: DC | PRN

## 2018-07-10 MED ORDER — MIDAZOLAM HCL 2 MG/2ML IJ SOLN
INTRAMUSCULAR | Status: AC
  Filled 2018-07-10: qty 2

## 2018-07-10 MED ORDER — STERILE WATER FOR IRRIGATION IR SOLN
Status: DC | PRN
  Administered 2018-07-10: 2000 mL

## 2018-07-10 MED ORDER — METOCLOPRAMIDE HCL 5 MG PO TABS: 5.0000 mg | ORAL_TABLET | Freq: Three times a day (TID) | ORAL | Status: DC | PRN

## 2018-07-10 MED ORDER — EPHEDRINE 5 MG/ML INJ
INTRAVENOUS | Status: AC
  Filled 2018-07-10: qty 10

## 2018-07-10 MED ORDER — NITROGLYCERIN 0.4 MG SL SUBL: 0.4000 mg | SUBLINGUAL_TABLET | SUBLINGUAL | Status: DC | PRN

## 2018-07-10 MED ORDER — HYDROCODONE-ACETAMINOPHEN 5-325 MG PO TABS
1.0000 | ORAL_TABLET | ORAL | Status: DC | PRN
  Administered 2018-07-10 – 2018-07-11 (×3): 1 via ORAL
  Filled 2018-07-10 (×3): qty 1

## 2018-07-10 MED ORDER — DIPHENHYDRAMINE HCL 12.5 MG/5ML PO ELIX: 12.5000 mg | ORAL_SOLUTION | ORAL | Status: DC | PRN

## 2018-07-10 MED ORDER — FERROUS SULFATE 325 (65 FE) MG PO TABS
325.0000 mg | ORAL_TABLET | Freq: Three times a day (TID) | ORAL | Status: DC
  Administered 2018-07-10 – 2018-07-11 (×3): 325 mg via ORAL
  Filled 2018-07-10 (×3): qty 1

## 2018-07-10 MED ORDER — ONDANSETRON HCL 4 MG PO TABS: 4.0000 mg | ORAL_TABLET | Freq: Four times a day (QID) | ORAL | Status: DC | PRN

## 2018-07-10 MED ORDER — CEFAZOLIN SODIUM-DEXTROSE 2-4 GM/100ML-% IV SOLN
2.0000 g | INTRAVENOUS | Status: AC
  Administered 2018-07-10: 2 g via INTRAVENOUS
  Filled 2018-07-10: qty 100

## 2018-07-10 MED ORDER — CEFAZOLIN SODIUM-DEXTROSE 2-4 GM/100ML-% IV SOLN
2.0000 g | Freq: Four times a day (QID) | INTRAVENOUS | Status: AC
  Administered 2018-07-10 (×2): 2 g via INTRAVENOUS
  Filled 2018-07-10 (×3): qty 100

## 2018-07-10 MED ORDER — PHENYLEPHRINE 40 MCG/ML (10ML) SYRINGE FOR IV PUSH (FOR BLOOD PRESSURE SUPPORT)
PREFILLED_SYRINGE | INTRAVENOUS | Status: DC | PRN
  Administered 2018-07-10: 120 ug via INTRAVENOUS

## 2018-07-10 MED ORDER — MIDAZOLAM HCL 2 MG/2ML IJ SOLN
INTRAMUSCULAR | Status: DC | PRN
  Administered 2018-07-10 (×2): 1 mg via INTRAVENOUS

## 2018-07-10 MED ORDER — PHENOL 1.4 % MT LIQD
1.0000 | OROMUCOSAL | Status: DC | PRN
  Filled 2018-07-10: qty 177

## 2018-07-10 MED ORDER — PROPOFOL 10 MG/ML IV BOLUS
INTRAVENOUS | Status: AC
  Filled 2018-07-10: qty 40

## 2018-07-10 MED ORDER — CELECOXIB 200 MG PO CAPS
200.0000 mg | ORAL_CAPSULE | Freq: Two times a day (BID) | ORAL | Status: DC
  Administered 2018-07-10 – 2018-07-11 (×2): 200 mg via ORAL
  Filled 2018-07-10 (×2): qty 1

## 2018-07-10 MED ORDER — PHENYLEPHRINE 40 MCG/ML (10ML) SYRINGE FOR IV PUSH (FOR BLOOD PRESSURE SUPPORT)
PREFILLED_SYRINGE | INTRAVENOUS | Status: AC
  Filled 2018-07-10: qty 20

## 2018-07-10 MED ORDER — LIDOCAINE 2% (20 MG/ML) 5 ML SYRINGE
INTRAMUSCULAR | Status: DC | PRN
  Administered 2018-07-10: 20 mg via INTRAVENOUS

## 2018-07-10 MED ORDER — LACTATED RINGERS IV SOLN
INTRAVENOUS | Status: DC
  Administered 2018-07-10 (×4): via INTRAVENOUS

## 2018-07-10 MED ORDER — SUCCINYLCHOLINE CHLORIDE 200 MG/10ML IV SOSY
PREFILLED_SYRINGE | INTRAVENOUS | Status: AC
  Filled 2018-07-10: qty 10

## 2018-07-10 MED ORDER — LIDOCAINE 2% (20 MG/ML) 5 ML SYRINGE
INTRAMUSCULAR | Status: AC
  Filled 2018-07-10: qty 10

## 2018-07-10 MED ORDER — BISACODYL 10 MG RE SUPP: 10.0000 mg | Freq: Every day | RECTAL | Status: DC | PRN

## 2018-07-10 MED ORDER — ONDANSETRON HCL 4 MG/2ML IJ SOLN: 4.0000 mg | Freq: Four times a day (QID) | INTRAMUSCULAR | Status: DC | PRN

## 2018-07-10 MED ORDER — DEXAMETHASONE SODIUM PHOSPHATE 10 MG/ML IJ SOLN: 10.0000 mg | Freq: Once | INTRAMUSCULAR | Status: DC

## 2018-07-10 MED ORDER — POLYETHYLENE GLYCOL 3350 17 G PO PACK: 17.0000 g | PACK | Freq: Two times a day (BID) | ORAL | 0 refills | Status: DC

## 2018-07-10 MED ORDER — SODIUM CHLORIDE 0.9 % IR SOLN
Status: DC | PRN
  Administered 2018-07-10: 1000 mL

## 2018-07-10 MED ORDER — METHOCARBAMOL 500 MG PO TABS
500.0000 mg | ORAL_TABLET | Freq: Four times a day (QID) | ORAL | Status: DC | PRN
  Administered 2018-07-10: 500 mg via ORAL
  Filled 2018-07-10: qty 1

## 2018-07-10 MED ORDER — PROPOFOL 10 MG/ML IV BOLUS
INTRAVENOUS | Status: DC | PRN
  Administered 2018-07-10: 10 mg via INTRAVENOUS
  Administered 2018-07-10 (×2): 20 mg via INTRAVENOUS

## 2018-07-10 SURGICAL SUPPLY — 44 items
BAG DECANTER FOR FLEXI CONT (MISCELLANEOUS) IMPLANT
BAG ZIPLOCK 12X15 (MISCELLANEOUS) IMPLANT
BLADE SAG 18X100X1.27 (BLADE) ×3 IMPLANT
BLADE SURG SZ10 CARB STEEL (BLADE) ×6 IMPLANT
CATH FOLEY 2WAY SLVR  5CC 14FR (CATHETERS)
CATH FOLEY 2WAY SLVR 5CC 14FR (CATHETERS) IMPLANT
COVER PERINEAL POST (MISCELLANEOUS) ×3 IMPLANT
COVER SURGICAL LIGHT HANDLE (MISCELLANEOUS) ×3 IMPLANT
COVER WAND RF STERILE (DRAPES) IMPLANT
CUP ACET PINNACLE SECTR 56MM (Hips) ×1 IMPLANT
DERMABOND ADVANCED (GAUZE/BANDAGES/DRESSINGS) ×2
DERMABOND ADVANCED .7 DNX12 (GAUZE/BANDAGES/DRESSINGS) ×1 IMPLANT
DRAPE STERI IOBAN 125X83 (DRAPES) ×3 IMPLANT
DRAPE U-SHAPE 47X51 STRL (DRAPES) ×6 IMPLANT
DRESSING AQUACEL AG SP 3.5X10 (GAUZE/BANDAGES/DRESSINGS) ×1 IMPLANT
DRSG AQUACEL AG SP 3.5X10 (GAUZE/BANDAGES/DRESSINGS) ×3
DURAPREP 26ML APPLICATOR (WOUND CARE) ×3 IMPLANT
ELECT REM PT RETURN 15FT ADLT (MISCELLANEOUS) ×3 IMPLANT
ELIMINATOR HOLE APEX DEPUY (Hips) ×3 IMPLANT
GLOVE BIOGEL M STRL SZ7.5 (GLOVE) IMPLANT
GLOVE BIOGEL PI IND STRL 7.5 (GLOVE) ×1 IMPLANT
GLOVE BIOGEL PI IND STRL 8.5 (GLOVE) ×1 IMPLANT
GLOVE BIOGEL PI INDICATOR 7.5 (GLOVE) ×2
GLOVE BIOGEL PI INDICATOR 8.5 (GLOVE) ×2
GLOVE ECLIPSE 8.0 STRL XLNG CF (GLOVE) ×6 IMPLANT
GLOVE ORTHO TXT STRL SZ7.5 (GLOVE) ×3 IMPLANT
GOWN STRL REUS W/TWL 2XL LVL3 (GOWN DISPOSABLE) ×3 IMPLANT
GOWN STRL REUS W/TWL LRG LVL3 (GOWN DISPOSABLE) ×3 IMPLANT
HEAD CERAMIC 36 PLUS 8.5 12 14 (Hips) ×3 IMPLANT
HOLDER FOLEY CATH W/STRAP (MISCELLANEOUS) IMPLANT
PACK ANTERIOR HIP CUSTOM (KITS) ×3 IMPLANT
PINNACLE ALTRX PLUS 4 N 36X56 (Hips) ×3 IMPLANT
PINNACLE SECTOR CUP 56MM (Hips) ×3 IMPLANT
SCREW 6.5MMX30MM (Screw) ×3 IMPLANT
STEM FEM ACTIS HIGH SZ7 (Stem) ×3 IMPLANT
SUT MNCRL AB 4-0 PS2 18 (SUTURE) ×3 IMPLANT
SUT STRATAFIX 0 PDS 27 VIOLET (SUTURE) ×3
SUT VIC AB 1 CT1 36 (SUTURE) ×9 IMPLANT
SUT VIC AB 2-0 CT1 27 (SUTURE) ×4
SUT VIC AB 2-0 CT1 TAPERPNT 27 (SUTURE) ×2 IMPLANT
SUTURE STRATFX 0 PDS 27 VIOLET (SUTURE) ×1 IMPLANT
TRAY FOLEY MTR SLVR 16FR STAT (SET/KITS/TRAYS/PACK) IMPLANT
WATER STERILE IRR 1000ML POUR (IV SOLUTION) ×3 IMPLANT
YANKAUER SUCT BULB TIP 10FT TU (MISCELLANEOUS) IMPLANT

## 2018-07-10 NOTE — Anesthesia Postprocedure Evaluation (Signed)
Anesthesia Post Note  Patient: Atwood Adcock  Procedure(s) Performed: TOTAL HIP ARTHROPLASTY ANTERIOR APPROACH (Left )     Patient location during evaluation: PACU Anesthesia Type: MAC Level of consciousness: awake and alert Pain management: pain level controlled Vital Signs Assessment: post-procedure vital signs reviewed and stable Respiratory status: spontaneous breathing and respiratory function stable Cardiovascular status: blood pressure returned to baseline and stable Postop Assessment: spinal receding Anesthetic complications: no    Last Vitals:  Vitals:   07/10/18 1130 07/10/18 1146  BP: 125/77 138/82  Pulse: 63 67  Resp: 15 15  Temp: (!) 36.3 C (!) 36.4 C  SpO2: 99% 97%    Last Pain:  Vitals:   07/10/18 1146  TempSrc: Oral  PainSc:     LLE Motor Response: (P) Purposeful movement (07/10/18 1150) LLE Sensation: (P) Decreased (07/10/18 1150) RLE Motor Response: (P) Purposeful movement (07/10/18 1150) RLE Sensation: (P) Decreased (07/10/18 1150)      Tiajuana Amass

## 2018-07-10 NOTE — Progress Notes (Signed)
   07/10/18 2151  MEWS Score  Pulse Rate (!) 107   Lopressor given, will continue to monitor.

## 2018-07-10 NOTE — Transfer of Care (Signed)
Immediate Anesthesia Transfer of Care Note  Patient: Jerric Oyen  Procedure(s) Performed: TOTAL HIP ARTHROPLASTY ANTERIOR APPROACH (Left )  Patient Location: PACU  Anesthesia Type:Spinal  Level of Consciousness: awake and alert   Airway & Oxygen Therapy: Patient Spontanous Breathing and Patient connected to face mask oxygen  Post-op Assessment: Report given to RN and Post -op Vital signs reviewed and stable  Post vital signs: Reviewed and stable  Last Vitals:  Vitals Value Taken Time  BP 113/82 07/10/2018 10:34 AM  Temp    Pulse 90 07/10/2018 10:35 AM  Resp 22 07/10/2018 10:35 AM  SpO2 94 % 07/10/2018 10:35 AM  Vitals shown include unvalidated device data.  Last Pain:  Vitals:   07/10/18 0603  TempSrc: Oral         Complications: No apparent anesthesia complications

## 2018-07-10 NOTE — Anesthesia Procedure Notes (Signed)
Date/Time: 07/10/2018 8:33 AM Performed by: Cynda Familia, CRNA Pre-anesthesia Checklist: Patient identified, Emergency Drugs available, Suction available, Patient being monitored and Timeout performed Patient Re-evaluated:Patient Re-evaluated prior to induction Oxygen Delivery Method: Simple face mask Placement Confirmation: positive ETCO2 and breath sounds checked- equal and bilateral Dental Injury: Teeth and Oropharynx as per pre-operative assessment  Comments: Sedation for spinal

## 2018-07-10 NOTE — Evaluation (Signed)
Physical Therapy Evaluation Patient Details Name: Timothy Estes MRN: [id number] DOB: [birth year] Today's Date: 07/10/2018   History of Present Illness  68 yo male s/p L THA-direct anterior 07/10/18. Hx of significant OA.  Clinical Impression  On eval POD 0, pt required Mod assist for mobility. He walked ~65 feet with a RW. Moderate pain with activity. Will progress activity as tolerated. D/C plan is for home with a HEP.    Follow Up Recommendations Follow surgeon's recommendation for DC plan and follow-up therapies    Equipment Recommendations  Rolling walker with 5" wheels(pt has a rollator at home)    Recommendations for Other Services       Precautions / Restrictions Precautions Precautions: Fall Restrictions Weight Bearing Restrictions: No Other Position/Activity Restrictions: wbat      Mobility  Bed Mobility               General bed mobility comments: sitting EOB at start of session  Transfers Overall transfer level: Needs assistance Equipment used: Rolling walker (2 wheeled) Transfers: Sit to/from Stand Sit to Stand: Mod assist;From elevated surface         General transfer comment: Assist to rise, stabilize, control descent. VCs safety, technique, hand/LE placement. Increased time.   Ambulation/Gait Ambulation/Gait assistance: Min assist Gait Distance (Feet): 65 Feet Assistive device: Rolling walker (2 wheeled) Gait Pattern/deviations: Step-to pattern     General Gait Details: Assist to stabilize pt. VCs safety, sequence, posture, flat foot on L.   Stairs            Wheelchair Mobility    Modified Rankin (Stroke Patients Only)       Balance Overall balance assessment: Needs assistance         Standing balance support: Bilateral upper extremity supported Standing balance-Leahy Scale: Poor                               Pertinent Vitals/Pain Pain Assessment: 0-10 Pain Score: 5  Pain Location: L hip/thigh Pain  Descriptors / Indicators: Sore;Aching Pain Intervention(s): Monitored during session;Repositioned    Home Living Family/patient expects to be discharged to:: Private residence Living Arrangements: Spouse/significant other Available Help at Discharge: Family Type of Home: House       Home Layout: One level Home Equipment: Environmental consultant - 4 wheels;Cane - single point;Crutches      Prior Function Level of Independence: Independent               Hand Dominance        Extremity/Trunk Assessment   Upper Extremity Assessment Upper Extremity Assessment: Overall WFL for tasks assessed    Lower Extremity Assessment Lower Extremity Assessment: Generalized weakness    Cervical / Trunk Assessment Cervical / Trunk Assessment: Normal  Communication   Communication: No difficulties  Cognition Arousal/Alertness: Awake/alert Behavior During Therapy: WFL for tasks assessed/performed Overall Cognitive Status: Within Functional Limits for tasks assessed                                        General Comments      Exercises     Assessment/Plan    PT Assessment Patient needs continued PT services  PT Problem List Decreased strength;Decreased balance;Decreased range of motion;Decreased mobility;Decreased activity tolerance;Pain;Decreased knowledge of use of DME       PT Treatment Interventions DME instruction;Gait training;Functional mobility training;Therapeutic  activities;Balance training;Patient/family education;Therapeutic exercise    PT Goals (Current goals can be found in the Care Plan section)  Acute Rehab PT Goals Patient Stated Goal: home PT Goal Formulation: With patient/family Time For Goal Achievement: 07/24/18 Potential to Achieve Goals: Good    Frequency 7X/week   Barriers to discharge        Co-evaluation               AM-PAC PT "6 Clicks" Mobility  Outcome Measure Help needed turning from your back to your side while in a flat bed  without using bedrails?: A Lot Help needed moving from lying on your back to sitting on the side of a flat bed without using bedrails?: A Lot Help needed moving to and from a bed to a chair (including a wheelchair)?: A Lot Help needed standing up from a chair using your arms (e.g., wheelchair or bedside chair)?: A Lot Help needed to walk in hospital room?: A Lot Help needed climbing 3-5 steps with a railing? : A Lot 6 Click Score: 12    End of Session Equipment Utilized During Treatment: Gait belt Activity Tolerance: Patient tolerated treatment well Patient left: in chair;with family/visitor present;with call bell/phone within reach   PT Visit Diagnosis: Other abnormalities of gait and mobility (R26.89);Unsteadiness on feet (R26.81)    Time: 7989-2119 PT Time Calculation (min) (ACUTE ONLY): 13 min   Charges:   PT Evaluation $PT Eval Low Complexity: [place], PT Acute Rehabilitation Services Pager: [phone] Office: [phone]

## 2018-07-10 NOTE — Plan of Care (Signed)
  Problem: Health Behavior/Discharge Planning: Goal: Ability to manage health-related needs will improve Outcome: Progressing   Problem: Clinical Measurements: Goal: Will remain free from infection Outcome: Progressing Goal: Respiratory complications will improve Outcome: Progressing   Problem: Coping: Goal: Level of anxiety will decrease Outcome: Progressing   Problem: Elimination: Goal: Will not experience complications related to urinary retention Outcome: Progressing   Problem: Pain Managment: Goal: General experience of comfort will improve Outcome: Progressing   Problem: Safety: Goal: Ability to remain free from injury will improve Outcome: Progressing

## 2018-07-10 NOTE — Anesthesia Procedure Notes (Signed)
Spinal  Patient location during procedure: OR Start time: 07/10/2018 8:38 AM End time: 07/10/2018 8:43 AM Staffing Anesthesiologist: Suzette Battiest, MD Performed: anesthesiologist  Preanesthetic Checklist Completed: patient identified, site marked, surgical consent, pre-op evaluation, timeout performed, IV checked, risks and benefits discussed and monitors and equipment checked Spinal Block Patient position: sitting Prep: DuraPrep Patient monitoring: heart rate, cardiac monitor, continuous pulse ox and blood pressure Approach: midline Location: L4-5 Injection technique: single-shot Needle Needle type: Pencan  Needle gauge: 24 G Needle length: 9 cm Assessment Sensory level: T4

## 2018-07-10 NOTE — OR Nursing (Signed)
07/10/2018; 08:40 Foley catheter insertion was unsuccessful with a 14 & 69fr coude catheters. Dr. Alvan Dame, Surgeon, opted to continue without an indwelling catheter.  Forestine Chute, RN, BSN

## 2018-07-10 NOTE — Discharge Instructions (Addendum)

## 2018-07-10 NOTE — Op Note (Signed)
NAME:  Julies Carmickle                ACCOUNT NO.: [id number]      MEDICAL RECORD NO.: [id number]      FACILITY:  [facility]      PHYSICIAN:  Mauri Pole  DATE OF BIRTH:  1950/07/09     DATE OF PROCEDURE:  07/10/2018                                 OPERATIVE REPORT         PREOPERATIVE DIAGNOSIS: Left  hip osteoarthritis.      POSTOPERATIVE DIAGNOSIS:  Left hip osteoarthritis.      PROCEDURE:  Left total hip replacement through an anterior approach   utilizing DePuy THR system, component size 40mm pinnacle cup, a size 36+4 neutral   Altrex liner, a size 7 Hi Actis femoral stem with a 36+5 delta ceramic   ball.      SURGEON:  Pietro Cassis. Alvan Dame, M.D.      ASSISTANT:  Danae Orleans, PA-C     ANESTHESIA:  Spinal.      SPECIMENS:  None.      COMPLICATIONS:  None.      BLOOD LOSS:  400 cc     DRAINS:  None.      INDICATION OF THE PROCEDURE:  Oral Remache is a 68 y.o. male who had   presented to office for evaluation of left hip pain.  Radiographs revealed   progressive degenerative changes with bone-on-bone   articulation of the  hip joint, including subchondral cystic changes and osteophytes.  The patient had painful limited range of   motion significantly affecting their overall quality of life and function.  The patient was failing to    respond to conservative measures including medications and/or injections and activity modification and at this point was ready   to proceed with more definitive measures.  Consent was obtained for   benefit of pain relief.  Specific risks of infection, DVT, component   failure, dislocation, neurovascular injury, and need for revision surgery were reviewed in the office as well discussion of   the anterior versus posterior approach were reviewed.     PROCEDURE IN DETAIL:  The patient was brought to operative theater.   Once adequate anesthesia, preoperative antibiotics, 2 gm of Ancef, 1 gm of Tranexamic Acid, and 10 mg of  Decadron were administered, the patient was positioned supine on the [employer] table.  Once the patient was safely positioned with adequate padding of boney prominences we predraped out the hip, and used fluoroscopy to confirm orientation of the pelvis.      The left hip was then prepped and draped from proximal iliac crest to   mid thigh with a shower curtain technique.      Time-out was performed identifying the patient, planned procedure, and the appropriate extremity.     An incision was then made 2 cm lateral to the   anterior superior iliac spine extending over the orientation of the   tensor fascia lata muscle and sharp dissection was carried down to the   fascia of the muscle.      The fascia was then incised.  The muscle belly was identified and swept   laterally and retractor placed along the superior neck.  Following   cauterization of the circumflex vessels and removing some pericapsular  fat, a second cobra retractor was placed on the inferior neck.  A T-capsulotomy was made along the line of the   superior neck to the trochanteric fossa, then extended proximally and   distally.  Tag sutures were placed and the retractors were then placed   intracapsular.  We then identified the trochanteric fossa and   orientation of my neck cut and then made a neck osteotomy with the femur on traction.  The femoral   head was removed without difficulty or complication.  Traction was let   off and retractors were placed posterior and anterior around the   acetabulum.      The labrum and foveal tissue were debrided.  I began reaming with a 46 mm   reamer and reamed up to 55 mm reamer with good bony bed preparation and a 56 mm  cup was chosen.  The final 56 mm Pinnacle cup was then impacted under fluoroscopy to confirm the depth of penetration and orientation with respect to   Abduction and forward flexion.  A screw was placed into the ilium followed by the hole eliminator.  The final   36+4  neutral Altrex liner was impacted with good visualized rim fit.  The cup was positioned anatomically within the acetabular portion of the pelvis.      At this point, the femur was rolled to 100 degrees.  Further capsule was   released off the inferior aspect of the femoral neck.  I then   released the superior capsule proximally.  With the leg in a neutral position the hook was placed laterally   along the femur under the vastus lateralis origin and elevated manually and then held in position using the hook attachment on the bed.  The leg was then extended and adducted with the leg rolled to 100   degrees of external rotation.  Retractors were placed along the medial calcar and posteriorly over the greater trochanter.  Once the proximal femur was fully   exposed, I used a box osteotome to set orientation.  I then began   broaching with the starting chili pepper broach and passed this by hand and then broached up to 7.  With the 7 broach in place I chose a high offset neck and did several trial reductions.  The offset was appropriate, leg lengths   appeared to be equal best matched with the +5 head ball trial confirmed radiographically.   Given these findings, I went ahead and dislocated the hip, repositioned all   retractors and positioned the right hip in the extended and abducted position.  The final 7 Hi Actis femoral stem was   chosen and it was impacted down to the level of neck cut.  Based on this   and the trial reductions, a final 36+5 delta ceramic ball was chosen and   impacted onto a clean and dry trunnion, and the hip was reduced.  The   hip had been irrigated throughout the case again at this point.  I did   reapproximate the superior capsular leaflet to the anterior leaflet   using #1 Vicryl.  The fascia of the   tensor fascia lata muscle was then reapproximated using #1 Vicryl and #0 Stratafix sutures.  The   remaining wound was closed with 2-0 Vicryl and running 4-0 Monocryl.    The hip was cleaned, dried, and dressed sterilely using Dermabond and   Aquacel dressing.  The patient was then brought   to recovery room  in stable condition tolerating the procedure well.    Danae Orleans, PA-C was present for the entirety of the case involved from   preoperative positioning, perioperative retractor management, general   facilitation of the case, as well as primary wound closure as assistant.            Pietro Cassis Alvan Dame, M.D.        07/10/2018 10:06 AM

## 2018-07-10 NOTE — Interval H&P Note (Signed)
History and Physical Interval Note:  07/10/2018 7:07 AM  Timothy Estes  has presented today for surgery, with the diagnosis of Left hip osteoarthritis  The various methods of treatment have been discussed with the patient and family. After consideration of risks, benefits and other options for treatment, the patient has consented to  Procedure(s) with comments: TOTAL HIP ARTHROPLASTY ANTERIOR APPROACH (Left) - 70 mins as a surgical intervention .  The patient's history has been reviewed, patient examined, no change in status, stable for surgery.  I have reviewed the patient's chart and labs.  Questions were answered to the patient's satisfaction.     Mauri Pole

## 2018-07-11 ENCOUNTER — Encounter (HOSPITAL_COMMUNITY): Payer: Self-pay | Admitting: Orthopedic Surgery

## 2018-07-11 DIAGNOSIS — E669 Obesity, unspecified: Secondary | ICD-10-CM | POA: Diagnosis present

## 2018-07-11 LAB — BASIC METABOLIC PANEL
Anion gap: 7 (ref 5–15)
BUN: 19 mg/dL (ref 8–23)
CO2: 21 mmol/L — ABNORMAL LOW (ref 22–32)
Calcium: 8.7 mg/dL — ABNORMAL LOW (ref 8.9–10.3)
Chloride: 108 mmol/L (ref 98–111)
Creatinine, Ser: 0.89 mg/dL (ref 0.61–1.24)
GFR calc Af Amer: >60 mL/min (ref >60)
GFR calc non Af Amer: >60 mL/min (ref >60)
GLUCOSE: 138 mg/dL — AB (ref 70–99)
Potassium: 4.4 mmol/L (ref 3.5–5.1)
Sodium: 136 mmol/L (ref 135–145)

## 2018-07-11 LAB — CBC
HCT: 35.9 % — ABNORMAL LOW (ref 39.0–52.0)
Hemoglobin: 11.5 g/dL — ABNORMAL LOW (ref 13.0–17.0)
MCH: 29 pg (ref 26.0–34.0)
MCHC: 32 g/dL (ref 30.0–36.0)
MCV: 90.4 fL (ref 80.0–100.0)
Platelets: 125 10*3/uL — ABNORMAL LOW (ref 150–400)
RBC: 3.97 MIL/uL — ABNORMAL LOW (ref 4.22–5.81)
RDW: 14.3 % (ref 11.5–15.5)
WBC: 15.8 10*3/uL — ABNORMAL HIGH (ref 4.0–10.5)
nRBC: 0 % (ref 0.0–0.2)

## 2018-07-11 NOTE — Progress Notes (Signed)
     Subjective: 1 Day Post-Op Procedure(s) (LRB): TOTAL HIP ARTHROPLASTY ANTERIOR APPROACH (Left)   Patient reports pain as mild, pain controlled. No events reported throughout the night.  Feels that he is progressing well.  Ready to be discharged home.   Objective:   VITALS:   Vitals:   07/11/18 0115 07/11/18 0538  BP: 123/74 (!) 151/87  Pulse: 67 75  Resp: 14 14  Temp: (!) 97.5 F (36.4 C) 98 F (36.7 C)  SpO2: 98% 99%    Dorsiflexion/Plantar flexion intact Incision: dressing C/D/I No cellulitis present Compartment soft  LABS Recent Labs    07/11/18 0511  HGB 11.5*  HCT 35.9*  WBC 15.8*  PLT 125*    Recent Labs    07/11/18 0511  NA 136  K 4.4  BUN 19  CREATININE 0.89  GLUCOSE 138*     Assessment/Plan: 1 Day Post-Op Procedure(s) (LRB): TOTAL HIP ARTHROPLASTY ANTERIOR APPROACH (Left) Foley cath d/c'ed Advance diet Up with therapy D/C IV fluids Discharge home Follow up in 2 weeks at [facility] Follow up with OLIN,Evaan Tidwell D in 2 weeks.  Contact information:  [facility] [address], [place] [phone]    Obese (BMI 30-39.9) Estimated body mass index is 33 kg/m as calculated from the following:   Height as of this encounter: 5\' 10"  (1.778 m).   Weight as of this encounter: 104.3 kg. Patient also counseled that weight may inhibit the healing process Patient counseled that losing weight will help with future health issues         West Pugh. Scott Vanderveer   PAC  07/11/2018, 8:59 AM

## 2018-07-11 NOTE — Progress Notes (Signed)
Physical Therapy Treatment Patient Details Name: Crockett Rallo MRN: [id number] DOB: 1951/01/24 Today's Date: 07/11/2018    History of Present Illness 68 yo male s/p L THA-direct anterior 07/10/18. Hx of significant OA.    PT Comments    Progressing well with mobility. Reviewed/practiced exercises and gait training. Issued HEP for pt to perform 2x/day. Pt and wife would like a standard RW-made RN aware. All education completed-made RN aware.    Follow Up Recommendations  Follow surgeon's recommendation for DC plan and follow-up therapies     Equipment Recommendations  Rolling walker with 5" wheels    Recommendations for Other Services       Precautions / Restrictions Precautions Precautions: Fall Restrictions Weight Bearing Restrictions: No Other Position/Activity Restrictions: wbat    Mobility  Bed Mobility Overal bed mobility: Needs Assistance Bed Mobility: Supine to Sit     Supine to sit: Min guard;HOB elevated     General bed mobility comments: Increased time. Close guard for safety. Pt relied on bedrail.   Transfers Overall transfer level: Needs assistance Equipment used: Rolling walker (2 wheeled) Transfers: Sit to/from Stand Sit to Stand: Min guard         General transfer comment: Close guard for safety.  VCs safety, technique, hand/LE placement. Increased time.   Ambulation/Gait Ambulation/Gait assistance: Min guard Gait Distance (Feet): 120 Feet Assistive device: Rolling walker (2 wheeled) Gait Pattern/deviations: Trunk flexed;Step-to pattern;Step-through pattern;Decreased stride length     General Gait Details: Close guard for safety.  VCs safety, sequence, posture.     Stairs             Wheelchair Mobility    Modified Rankin (Stroke Patients Only)       Balance                                            Cognition Arousal/Alertness: Awake/alert Behavior During Therapy: WFL for tasks assessed/performed Overall  Cognitive Status: Within Functional Limits for tasks assessed                                        Exercises Total Joint Exercises Ankle Circles/Pumps: AROM;Both;10 reps;Supine Quad Sets: AROM;Both;10 reps;Supine Heel Slides: AAROM;Left;10 reps;Supine Hip ABduction/ADduction: AAROM;Left;10 reps;Supine;Standing Knee Flexion: AROM;Left;10 reps;Standing Marching in Standing: AROM;Both;10 reps;Standing    General Comments        Pertinent Vitals/Pain Pain Assessment: 0-10 Pain Score: 5  Pain Location: L hip/thigh Pain Descriptors / Indicators: Sore;Aching Pain Intervention(s): Monitored during session;Repositioned;Ice applied    Home Living                      Prior Function            PT Goals (current goals can now be found in the care plan section) Progress towards PT goals: Progressing toward goals    Frequency    7X/week      PT Plan Current plan remains appropriate    Co-evaluation              AM-PAC PT "6 Clicks" Mobility   Outcome Measure  Help needed turning from your back to your side while in a flat bed without using bedrails?: A Little Help needed moving from lying on your back to sitting on the side  of a flat bed without using bedrails?: A Little Help needed moving to and from a bed to a chair (including a wheelchair)?: A Little Help needed standing up from a chair using your arms (e.g., wheelchair or bedside chair)?: A Little Help needed to walk in hospital room?: A Little Help needed climbing 3-5 steps with a railing? : A Little 6 Click Score: 18    End of Session Equipment Utilized During Treatment: Gait belt Activity Tolerance: Patient tolerated treatment well Patient left: in chair;with call bell/phone within reach;with family/visitor present   PT Visit Diagnosis: Other abnormalities of gait and mobility (R26.89);Unsteadiness on feet (R26.81)     Time: 1021-1050 PT Time Calculation (min) (ACUTE ONLY):  29 min  Charges:  $Gait Training: 8-22 mins $Therapeutic Exercise: 8-22 mins                        Weston Anna, PT Acute Rehabilitation Services Pager: [phone] Office: [phone]

## 2018-07-13 NOTE — Discharge Summary (Signed)
Physician Discharge Summary  Patient ID: Timothy Estes MRN: [id number] DOB/AGE: [birth year] 68 y.o.  Admit date: 07/10/2018 Discharge date: 07/11/2018   Procedures:  Procedure(s) (LRB): TOTAL HIP ARTHROPLASTY ANTERIOR APPROACH (Left)  Attending Physician:  Dr. Paralee Cancel   Admission Diagnoses:   Left hip primary OA / pain  Discharge Diagnoses:  Principal Problem:   S/P left THA Active Problems:   Obese  Past Medical History:  Diagnosis Date  . Anemia   . Arthritis   . Atrial fibrillation with RVR ([place])    converted to NSR with IV dilt 10/12 - no coumadin due to need for Plavix and ASA  . Cataract   . Clotting disorder ([place])    PE 2012  . Colon polyps  10/2015   (non-adenomatous)  . Coronary artery disease    LHC 03/17/11: Dx 30%, OM1 30%, OM2 30%, pRCA 30%, EF 40-45% with anterolat and apical HK  . Diverticulosis  10/2015   noted on colonoscopy  . Elevated LFTs   . Fatty liver   . Hematochezia    LIKEY FROM HEMORRHOIDAL BLEEDING; PT HAS H/O  . Hyperlipidemia   . Hypertension   . Internal hemorrhoids  10/2015   noted on colonoscopy  . NSTEMI (non-ST elevated myocardial infarction) ([place])    2012-FLET THAT NON-ST-ELEVATION MI IS POSSIBLY SECONDARY TO CORONARY EMBOLUS FROM HIS AFIB  . Syncope    in setting of AFib with RVR    HPI:    Timothy Estes, 68 y.o. male, has a history of pain and functional disability in the left hip(s) due to arthritis and patient has failed non-surgical conservative treatments for greater than 12 weeks to include NSAID's and/or analgesics and activity modification.  Onset of symptoms was gradual starting 2+ years ago with gradually worsening course since that time.The patient noted no past surgery on the left hip(s).  Patient currently rates pain in the left hip at 6 out of 10 with activity. Patient has night pain, worsening of pain with activity and weight bearing, trendelenberg gait, pain that interfers with activities of daily living and pain  with passive range of motion. Patient has evidence of periarticular osteophytes and joint space narrowing by imaging studies. This condition presents safety issues increasing the risk of falls.  There is no current active infection.  Risks, benefits and expectations were discussed with the patient.  Risks including but not limited to the risk of anesthesia, blood clots, nerve damage, blood vessel damage, failure of the prosthesis, infection and up to and including death.  Patient understand the risks, benefits and expectations and wishes to proceed with surgery.   PCP: Rita Ohara, MD   Discharged Condition: good  Hospital Course:  Patient underwent the above stated procedure on 07/10/2018. Patient tolerated the procedure well and brought to the recovery room in good condition and subsequently to the floor.  POD #1 BP: 151/87 ; Pulse: 75 ; Temp: 98 F (36.7 C) ; Resp: 14 Patient reports pain as mild, pain controlled. No events reported throughout the night.  Feels that he is progressing well.  Ready to be discharged home. Dorsiflexion/plantar flexion intact, incision: dressing C/D/I, no cellulitis present and compartment soft.   LABS  Basename    HGB     11.5  HCT     35.9    Discharge Exam: General appearance: alert, cooperative and no distress Extremities: Homans sign is negative, no sign of DVT, no edema, redness or tenderness in the calves or thighs and no  ulcers, gangrene or trophic changes  Disposition:  Home with follow up in 2 weeks   Follow-up Information    Paralee Cancel, MD. Schedule an appointment as soon as possible for a visit in 2 week(s).   Specialty:  Orthopedic Surgery Why:  call for 2 week f/u appointment upon discharge to home Contact information: [address] [place] [address] [phone]           Discharge Instructions    Call MD / Call 911   Complete by:  As directed    If you experience chest pain or shortness of breath, CALL  911 and be transported to the hospital emergency room.  If you develope a fever above 101 F, pus (white drainage) or increased drainage or redness at the wound, or calf pain, call your surgeon's office.   Change dressing   Complete by:  As directed    Maintain surgical dressing until follow up in the clinic. If the edges start to pull up, may reinforce with tape. If the dressing is no longer working, may remove and cover with gauze and tape, but must keep the area dry and clean.  Call with any questions or concerns.   Constipation Prevention   Complete by:  As directed    Drink plenty of fluids.  Prune juice may be helpful.  You may use a stool softener, such as Colace (over the counter) 100 mg twice a day.  Use MiraLax (over the counter) for constipation as needed.   Diet - low sodium heart healthy   Complete by:  As directed    Discharge instructions   Complete by:  As directed    Maintain surgical dressing until follow up in the clinic. If the edges start to pull up, may reinforce with tape. If the dressing is no longer working, may remove and cover with gauze and tape, but must keep the area dry and clean.  Follow up in 2 weeks at [facility]. Call with any questions or concerns.   Increase activity slowly as tolerated   Complete by:  As directed    Weight bearing as tolerated with assist device (walker, cane, etc) as directed, use it as long as suggested by your surgeon or therapist, typically at least 4-6 weeks.   TED hose   Complete by:  As directed    Use stockings (TED hose) for 2 weeks on both leg(s).  You may remove them at night for sleeping.      Allergies as of 07/11/2018   No Known Allergies     Medication List    STOP taking these medications   aspirin EC 81 MG tablet     TAKE these medications   allopurinol 300 MG tablet Commonly known as:  ZYLOPRIM TAKE 1 TABLET (300 MG TOTAL) DAILY BY MOUTH.   cholecalciferol 1000 units tablet Commonly known as:   VITAMIN D Take 1,000 Units by mouth daily.   Coenzyme Q-10 100 MG capsule Take 1 tablet by mouth daily.   docusate sodium 100 MG capsule Commonly known as:  COLACE Take 1 capsule (100 mg total) by mouth 2 (two) times daily.   ferrous sulfate 325 (65 FE) MG tablet Commonly known as:  FERROUSUL Take 1 tablet (325 mg total) by mouth 3 (three) times daily with meals.   fish oil-omega-3 fatty acids 1000 MG capsule Take 2 g by mouth 2 (two) times daily.   HYDROcodone-acetaminophen 7.5-325 MG tablet Commonly known as:  NORCO  Take 1-2 tablets by mouth every 4 (four) hours as needed for moderate pain.   methocarbamol 500 MG tablet Commonly known as:  ROBAXIN Take 1 tablet (500 mg total) by mouth every 6 (six) hours as needed for muscle spasms.   metoprolol tartrate 25 MG tablet Commonly known as:  LOPRESSOR Take 1 tablet (25 mg total) by mouth 2 (two) times daily. TAKE 1 TABLET BY MOUTH TWICE A DAY   niacin 1000 MG CR tablet Commonly known as:  NIASPAN TAKE 1 TABLET (1,000 MG TOTAL) BY MOUTH AT BEDTIME.   nitroGLYCERIN 0.4 MG SL tablet Commonly known as:  NITROSTAT Place 1 tablet (0.4 mg total) every 5 (five) minutes as needed under the tongue for chest pain.   polyethylene glycol packet Commonly known as:  MIRALAX / GLYCOLAX Take 17 g by mouth 2 (two) times daily.   rosuvastatin 40 MG tablet Commonly known as:  CRESTOR TAKE 1 TABLET (40 MG TOTAL) DAILY BY MOUTH.   XARELTO 20 MG Tabs tablet Generic drug:  rivaroxaban TAKE 1 TABLET (20 MG TOTAL) BY MOUTH DAILY WITH SUPPER.            Discharge Care Instructions  (From admission, onward)         Start     Ordered   07/11/18 0000  Change dressing    Comments:  Maintain surgical dressing until follow up in the clinic. If the edges start to pull up, may reinforce with tape. If the dressing is no longer working, may remove and cover with gauze and tape, but must keep the area dry and clean.  Call with any questions or  concerns.   07/11/18 1126           Signed: West Pugh. Markas Aldredge   PA-C  07/13/2018, 3:02 PM

## 2018-08-01 ENCOUNTER — Other Ambulatory Visit: Payer: Self-pay | Admitting: Family Medicine

## 2018-08-01 DIAGNOSIS — I2510 Atherosclerotic heart disease of native coronary artery without angina pectoris: Secondary | ICD-10-CM

## 2018-08-01 DIAGNOSIS — M109 Gout, unspecified: Secondary | ICD-10-CM

## 2018-08-01 DIAGNOSIS — E782 Mixed hyperlipidemia: Secondary | ICD-10-CM

## 2018-08-20 ENCOUNTER — Other Ambulatory Visit: Payer: Self-pay | Admitting: Internal Medicine

## 2018-08-20 DIAGNOSIS — I480 Paroxysmal atrial fibrillation: Secondary | ICD-10-CM

## 2018-08-20 NOTE — Telephone Encounter (Signed)
Xarelto 20mg  refill request received; pt is 68 yrs old, wt-98kg, Crea-0.89 on 07/11/2018, last seen by Dr. Lovena Le on 09/22/2017, CrCl-134ml/min; refill sent.

## 2018-10-02 ENCOUNTER — Other Ambulatory Visit: Payer: Self-pay | Admitting: Family Medicine

## 2018-10-02 DIAGNOSIS — I2510 Atherosclerotic heart disease of native coronary artery without angina pectoris: Secondary | ICD-10-CM

## 2018-10-02 DIAGNOSIS — I10 Essential (primary) hypertension: Secondary | ICD-10-CM

## 2018-10-09 ENCOUNTER — Ambulatory Visit: Payer: Medicare Other | Admitting: Internal Medicine

## 2018-10-11 ENCOUNTER — Telehealth: Payer: Self-pay | Admitting: Internal Medicine

## 2018-10-11 NOTE — Telephone Encounter (Signed)
Virtual Visit Pre-Appointment Phone Call  "(Name), I am calling you today to discuss your upcoming appointment. We are currently trying to limit exposure to the virus that causes COVID-19 by seeing patients at home rather than in the office."  1. "What is the BEST phone number to call the day of the visit?" - include this in appointment notes  2. Do you have or have access to (through a family member/friend) a smartphone with video capability that we can use for your visit?" a. If yes - list this number in appt notes as cell (if different from BEST phone #) and list the appointment type as a VIDEO visit in appointment notes b. If no - list the appointment type as a PHONE visit in appointment notes  3. Confirm consent - "In the setting of the current Covid19 crisis, you are scheduled for a (phone or video) visit with your provider on (date) at (time).  Just as we do with many in-office visits, in order for you to participate in this visit, we must obtain consent.  If you'd like, I can send this to your mychart (if signed up) or email for you to review.  Otherwise, I can obtain your verbal consent now.  All virtual visits are billed to your insurance company just like a normal visit would be.  By agreeing to a virtual visit, we'd like you to understand that the technology does not allow for your provider to perform an examination, and thus may limit your provider's ability to fully assess your condition. If your provider identifies any concerns that need to be evaluated in person, we will make arrangements to do so.  Finally, though the technology is pretty good, we cannot assure that it will always work on either your or our end, and in the setting of a video visit, we may have to convert it to a phone-only visit.  In either situation, we cannot ensure that we have a secure connection.  Are you willing to proceed?" STAFF: Did the patient verbally acknowledge consent to telehealth visit? Document  YES/NO here: yes 4.   5. Advise patient to be prepared - "Two hours prior to your appointment, go ahead and check your blood pressure, pulse, oxygen saturation, and your weight (if you have the equipment to check those) and write them all down. When your visit starts, your provider will ask you for this information. If you have an Apple Watch or Kardia device, please plan to have heart rate information ready on the day of your appointment. Please have a pen and paper handy nearby the day of the visit as well."  6. Give patient instructions for MyChart download to smartphone OR Doximity/Doxy.me as below if video visit (depending on what platform provider is using)  7. Inform patient they will receive a phone call 15 minutes prior to their appointment time (may be from unknown caller ID) so they should be prepared to answer    TELEPHONE CALL NOTE  Timothy Estes has been deemed a candidate for a follow-up tele-health visit to limit community exposure during the Covid-19 pandemic. I spoke with the patient via phone to ensure availability of phone/video source, confirm preferred email & phone number, and discuss instructions and expectations.  I reminded Timothy Estes to be prepared with any vital sign and/or heart rhythm information that could potentially be obtained via home monitoring, at the time of his visit. I reminded Timothy Estes to expect a phone call prior to his  visit.  Timothy Estes 10/11/2018 12:45 PM   INSTRUCTIONS FOR DOWNLOADING THE MYCHART APP TO SMARTPHONE  - The patient must first make sure to have activated MyChart and know their login information - If Apple, go to [employer] and type in MyChart in the search bar and download the app. If Android, ask patient to go to [employer] and type in [place] in the search bar and download the app. The app is free but as with any other app downloads, their phone may require them to verify saved payment information or Apple/Android  password.  - The patient will need to then log into the app with their MyChart username and password, and select [facility] as their healthcare provider to link the account. When it is time for your visit, go to the MyChart app, find appointments, and click Begin Video Visit. Be sure to Select Allow for your device to access the Microphone and Camera for your visit. You will then be connected, and your provider will be with you shortly.  **If they have any issues connecting, or need assistance please contact MyChart service desk [phone])**  **If using a computer, in order to ensure the best quality for their visit they will need to use either of the following Internet Browsers: [employer], or Google Chrome**  IF USING DOXIMITY or DOXY.ME - The patient will receive a link just prior to their visit by text.     FULL LENGTH CONSENT FOR TELE-HEALTH VISIT   I hereby voluntarily request, consent and authorize [facility] and its employed or contracted physicians, physician assistants, nurse practitioners or other licensed health care professionals (the Practitioner), to provide me with telemedicine health care services (the Services") as deemed necessary by the treating Practitioner. I acknowledge and consent to receive the Services by the Practitioner via telemedicine. I understand that the telemedicine visit will involve communicating with the Practitioner through live audiovisual communication technology and the disclosure of certain medical information by electronic transmission. I acknowledge that I have been given the opportunity to request an in-person assessment or other available alternative prior to the telemedicine visit and am voluntarily participating in the telemedicine visit.  I understand that I have the right to withhold or withdraw my consent to the use of telemedicine in the course of my care at any time, without affecting my right to future care or treatment,  and that the Practitioner or I may terminate the telemedicine visit at any time. I understand that I have the right to inspect all information obtained and/or recorded in the course of the telemedicine visit and may receive copies of available information for a reasonable fee.  I understand that some of the potential risks of receiving the Services via telemedicine include:   Delay or interruption in medical evaluation due to technological equipment failure or disruption;  Information transmitted may not be sufficient (e.g. poor resolution of images) to allow for appropriate medical decision making by the Practitioner; and/or   In rare instances, security protocols could fail, causing a breach of personal health information.  Furthermore, I acknowledge that it is my responsibility to provide information about my medical history, conditions and care that is complete and accurate to the best of my ability. I acknowledge that Practitioner's advice, recommendations, and/or decision may be based on factors not within their control, such as incomplete or inaccurate data provided by me or distortions of diagnostic images or specimens that may result from electronic transmissions. I understand  that the practice of medicine is not an exact science and that Practitioner makes no warranties or guarantees regarding treatment outcomes. I acknowledge that I will receive a copy of this consent concurrently upon execution via email to the email address I last provided but may also request a printed copy by calling the office of [facility].    I understand that my insurance will be billed for this visit.   I have read or had this consent read to me.  I understand the contents of this consent, which adequately explains the benefits and risks of the Services being provided via telemedicine.   I have been provided ample opportunity to ask questions regarding this consent and the Services and have had my questions  answered to my satisfaction.  I give my informed consent for the services to be provided through the use of telemedicine in my medical care  By participating in this telemedicine visit I agree to the above.

## 2018-10-17 ENCOUNTER — Other Ambulatory Visit: Payer: Self-pay

## 2018-10-17 ENCOUNTER — Telehealth (INDEPENDENT_AMBULATORY_CARE_PROVIDER_SITE_OTHER): Payer: Medicare Other | Admitting: Internal Medicine

## 2018-10-17 VITALS — BP 126/82 | Ht 70.0 in | Wt 219.0 lb

## 2018-10-17 DIAGNOSIS — I480 Paroxysmal atrial fibrillation: Secondary | ICD-10-CM

## 2018-10-17 DIAGNOSIS — I10 Essential (primary) hypertension: Secondary | ICD-10-CM

## 2018-10-17 NOTE — Progress Notes (Signed)
Electrophysiology TeleHealth Note   Due to national recommendations of social distancing due to COVID 19, an audio/video telehealth visit is felt to be most appropriate for this patient at this time.  See MyChart message from today for the patient's consent to telehealth for [facility]   Date:  10/17/2018   ID:  Timothy Estes, DOB [birth year], MRN [id number]  Location: patient's home  Provider location: [address], [place]  Evaluation Performed: Follow-up visit  PCP:  Rita Ohara, MD  Cardiologist:  Cristopher Peru, MD  Electrophysiologist:  Dr Lovena Le  Chief Complaint:  "I been doing pretty good."  History of Present Illness:    Timothy Estes is a 68 y.o. male who presents via audio/video conferencing for a telehealth visit today. He is a pleasant man with PAF, HTN, and CAD. He has recently undergon hip replacement. His pain is gone and his rehab went very well.  Since last being seen in our clinic, the patient reports doing very well.  Today, he denies symptoms of palpitations, chest pain, shortness of breath,  lower extremity edema, dizziness, presyncope, or syncope.  The patient is otherwise without complaint today.  The patient denies symptoms of fevers, chills, cough, or new SOB worrisome for COVID 19.  Past Medical History:  Diagnosis Date  . Anemia   . Arthritis   . Atrial fibrillation with RVR ([place])    converted to NSR with IV dilt 10/12 - no coumadin due to need for Plavix and ASA  . Cataract   . Clotting disorder ([place])    PE 2012  . Colon polyps  10/2015   (non-adenomatous)  . Coronary artery disease    LHC 03/17/11: Dx 30%, OM1 30%, OM2 30%, pRCA 30%, EF 40-45% with anterolat and apical HK  . Diverticulosis  10/2015   noted on colonoscopy  . Elevated LFTs   . Fatty liver   . Hematochezia    LIKEY FROM HEMORRHOIDAL BLEEDING; PT HAS H/O  . Hyperlipidemia   . Hypertension   . Internal hemorrhoids  10/2015   noted on colonoscopy  . NSTEMI (non-ST  elevated myocardial infarction) ([place])    2012-FLET THAT NON-ST-ELEVATION MI IS POSSIBLY SECONDARY TO CORONARY EMBOLUS FROM HIS AFIB  . Syncope    in setting of AFib with RVR    Past Surgical History:  Procedure Laterality Date  . CARDIAC CATHETERIZATION  03/18/11  . cardiac event monitor  04/01/11  . CATARACT EXTRACTION W/PHACO Left 09/18/2014   Procedure: CATARACT EXTRACTION PHACO AND INTRAOCULAR LENS PLACEMENT LEFT EYE;  Surgeon: Tonny Branch, MD;  Location: [facility];  Service: Ophthalmology;  Laterality: Left;  CDE 13.32  . COLONOSCOPY    . ESOPHAGOGASTRODUODENOSCOPY ENDOSCOPY    . MOUTH SURGERY    . TOTAL HIP ARTHROPLASTY Left 07/10/2018   Procedure: TOTAL HIP ARTHROPLASTY ANTERIOR APPROACH;  Surgeon: Paralee Cancel, MD;  Location: [facility];  Service: Orthopedics;  Laterality: Left;  70 mins  . TRANSTHORACIC ECHOCARDIOGRAM  03/19/11  . WISDOM TOOTH EXTRACTION      Current Outpatient Medications  Medication Sig Dispense Refill  . allopurinol (ZYLOPRIM) 300 MG tablet TAKE 1 TABLET (300 MG TOTAL) DAILY BY MOUTH. 90 tablet 0  . cholecalciferol (VITAMIN D) 1000 units tablet Take 1,000 Units by mouth daily.    . Coenzyme Q-10 100 MG capsule Take 1 tablet by mouth daily.     . fish oil-omega-3 fatty acids 1000 MG capsule Take 2 g by mouth 2 (two) times daily.     Marland Kitchen  metoprolol tartrate (LOPRESSOR) 25 MG tablet TAKE 1 TABLET (25 MG TOTAL) BY MOUTH 2 (TWO) TIMES DAILY. 180 tablet 0  . niacin (NIASPAN) 1000 MG CR tablet TAKE 1 TABLET (1,000 MG TOTAL) BY MOUTH AT BEDTIME. 30 tablet 5  . nitroGLYCERIN (NITROSTAT) 0.4 MG SL tablet Place 1 tablet (0.4 mg total) every 5 (five) minutes as needed under the tongue for chest pain. 28 tablet 1  . polyethylene glycol (MIRALAX / GLYCOLAX) packet Take 17 g by mouth 2 (two) times daily. 14 each 0  . rosuvastatin (CRESTOR) 40 MG tablet TAKE 1 TABLET (40 MG TOTAL) DAILY BY MOUTH. 90 tablet 0  . XARELTO 20 MG TABS tablet TAKE 1 TABLET (20 MG TOTAL) BY MOUTH DAILY WITH  SUPPER. 90 tablet 0   No current facility-administered medications for this visit.     Allergies:   Patient has no known allergies.   Social History:  The patient  reports that he quit smoking about 43 years ago. His smoking use included cigarettes. He has a 1.50 pack-year smoking history. He has never used smokeless tobacco. He reports current alcohol use. He reports that he does not use drugs.   Family History:  The patient's  family history includes Arthritis in his mother; Hyperlipidemia in his brother; Hypertension in his brother.   ROS:  Please see the history of present illness.   All other systems are personally reviewed and negative.    Exam:    Vital Signs:  BP 126/82   Ht 5\' 10"  (1.778 m)   Wt 219 lb (99.3 kg)   BMI 31.42 kg/m   Well appearing, alert and conversant, regular work of breathing,  good skin color Eyes- anicteric, neuro- grossly intact, skin- no apparent rash or lesions or cyanosis, mouth- oral mucosa is pink   Labs/Other Tests and Data Reviewed:    Recent Labs: 05/10/2018: ALT 27; TSH 2.660 07/11/2018: BUN 19; Creatinine, Ser 0.89; Hemoglobin 11.5; Platelets 125; Potassium 4.4; Sodium 136   Wt Readings from Last 3 Encounters:  10/17/18 219 lb (99.3 kg)  07/10/18 230 lb (104.3 kg)  07/04/18 230 lb 12.8 oz (104.7 kg)     Other studies personally reviewed: none   ASSESSMENT & PLAN:    1.  PAF - he appears to be maintaining NSR. He will continue his current meds. 2. CAD - he denies anginal symptoms. He will continue his current meds. 3. HTN - his pressure has been controlled.  4. COVID 19 screen The patient denies symptoms of COVID 19 at this time.  The importance of social distancing was discussed today.  Follow-up:  6 months Next remote: n/a  Current medicines are reviewed at length with the patient today.   The patient does not have concerns regarding his medicines.  The following changes were made today:  none  Labs/ tests ordered today  include: none No orders of the defined types were placed in this encounter.    Patient Risk:  after full review of this patients clinical status, I feel that they are at moderate risk at this time.  Today, I have spent 15 minutes with the patient with telehealth technology discussing all of the above.    Signed, Cristopher Peru, MD  10/17/2018 8:58 AM     [place] [place] [address] [phone] (office) [phone] (fax)

## 2018-10-17 NOTE — Patient Instructions (Signed)
Medication Instructions:  Your physician recommends that you continue on your current medications as directed. Please refer to the Current Medication list given to you today.   Labwork: NONE  Testing/Procedures: NONE   Follow-Up: Your physician wants you to follow-up in: 6 Months with Dr. Taylor.  You will receive a reminder letter in the mail two months in advance. If you don't receive a letter, please call our office to schedule the follow-up appointment.   Any Other Special Instructions Will Be Listed Below (If Applicable).     If you need a refill on your cardiac medications before your next appointment, please call your pharmacy.  Thank you for choosing [facility]!   

## 2018-10-25 ENCOUNTER — Other Ambulatory Visit: Payer: Self-pay | Admitting: Family Medicine

## 2018-10-25 DIAGNOSIS — I2510 Atherosclerotic heart disease of native coronary artery without angina pectoris: Secondary | ICD-10-CM

## 2018-10-25 DIAGNOSIS — I10 Essential (primary) hypertension: Secondary | ICD-10-CM

## 2018-10-25 DIAGNOSIS — M109 Gout, unspecified: Secondary | ICD-10-CM

## 2018-10-25 DIAGNOSIS — Z5181 Encounter for therapeutic drug level monitoring: Secondary | ICD-10-CM

## 2018-10-25 DIAGNOSIS — R7301 Impaired fasting glucose: Secondary | ICD-10-CM

## 2018-10-25 DIAGNOSIS — E782 Mixed hyperlipidemia: Secondary | ICD-10-CM

## 2018-10-25 DIAGNOSIS — D509 Iron deficiency anemia, unspecified: Secondary | ICD-10-CM

## 2018-10-25 DIAGNOSIS — Z125 Encounter for screening for malignant neoplasm of prostate: Secondary | ICD-10-CM

## 2018-10-30 ENCOUNTER — Other Ambulatory Visit: Payer: Self-pay

## 2018-10-30 ENCOUNTER — Other Ambulatory Visit: Payer: Medicare Other

## 2018-10-30 DIAGNOSIS — E782 Mixed hyperlipidemia: Secondary | ICD-10-CM

## 2018-10-30 DIAGNOSIS — Z5181 Encounter for therapeutic drug level monitoring: Secondary | ICD-10-CM

## 2018-10-30 DIAGNOSIS — M109 Gout, unspecified: Secondary | ICD-10-CM

## 2018-10-30 DIAGNOSIS — R7301 Impaired fasting glucose: Secondary | ICD-10-CM

## 2018-10-30 DIAGNOSIS — Z125 Encounter for screening for malignant neoplasm of prostate: Secondary | ICD-10-CM

## 2018-10-30 LAB — LIPID PANEL

## 2018-10-31 LAB — COMPREHENSIVE METABOLIC PANEL
ALT: 53 IU/L — ABNORMAL HIGH (ref 0–44)
AST: 56 IU/L — ABNORMAL HIGH (ref 0–40)
Albumin/Globulin Ratio: 1.5 (ref 1.2–2.2)
Albumin: 4.5 g/dL (ref 3.8–4.8)
Alkaline Phosphatase: 77 IU/L (ref 39–117)
BUN/Creatinine Ratio: 13 (ref 10–24)
BUN: 15 mg/dL (ref 8–27)
Bilirubin Total: 0.4 mg/dL (ref 0.0–1.2)
CO2: 21 mmol/L (ref 20–29)
Calcium: 9.4 mg/dL (ref 8.6–10.2)
Chloride: 103 mmol/L (ref 96–106)
Creatinine, Ser: 1.18 mg/dL (ref 0.76–1.27)
GFR calc Af Amer: 73 mL/min/{1.73_m2} (ref >59)
GFR calc non Af Amer: 63 mL/min/{1.73_m2} (ref >59)
Globulin, Total: 3.1 g/dL (ref 1.5–4.5)
Glucose: 106 mg/dL — ABNORMAL HIGH (ref 65–99)
Potassium: 4.7 mmol/L (ref 3.5–5.2)
Sodium: 140 mmol/L (ref 134–144)
Total Protein: 7.6 g/dL (ref 6.0–8.5)

## 2018-10-31 LAB — LIPID PANEL
Chol/HDL Ratio: 3.6 ratio (ref 0.0–5.0)
Cholesterol, Total: 154 mg/dL (ref 100–199)
HDL: 43 mg/dL (ref >39)
LDL Calculated: 78 mg/dL (ref 0–99)
Triglycerides: 164 mg/dL — ABNORMAL HIGH (ref 0–149)
VLDL Cholesterol Cal: 33 mg/dL (ref 5–40)

## 2018-10-31 LAB — CBC WITH DIFFERENTIAL/PLATELET
Basophils Absolute: 0.1 10*3/uL (ref 0.0–0.2)
Basos: 1 %
EOS (ABSOLUTE): 0.4 10*3/uL (ref 0.0–0.4)
Eos: 6 %
Hematocrit: 42.5 % (ref 37.5–51.0)
Hemoglobin: 14.5 g/dL (ref 13.0–17.7)
Immature Grans (Abs): 0 10*3/uL (ref 0.0–0.1)
Immature Granulocytes: 0 %
Lymphocytes Absolute: 2.8 10*3/uL (ref 0.7–3.1)
Lymphs: 37 %
MCH: 27.6 pg (ref 26.6–33.0)
MCHC: 34.1 g/dL (ref 31.5–35.7)
MCV: 81 fL (ref 79–97)
Monocytes Absolute: 0.7 10*3/uL (ref 0.1–0.9)
Monocytes: 9 %
Neutrophils Absolute: 3.6 10*3/uL (ref 1.4–7.0)
Neutrophils: 47 %
Platelets: 212 10*3/uL (ref 150–450)
RBC: 5.26 x10E6/uL (ref 4.14–5.80)
RDW: 15.2 % (ref 11.6–15.4)
WBC: 7.7 10*3/uL (ref 3.4–10.8)

## 2018-10-31 LAB — HEMOGLOBIN A1C
Est. average glucose Bld gHb Est-mCnc: 111 mg/dL
Hgb A1c MFr Bld: 5.5 % (ref 4.8–5.6)

## 2018-10-31 LAB — URIC ACID: Uric Acid: 4 mg/dL (ref 3.7–8.6)

## 2018-10-31 LAB — PSA: Prostate Specific Ag, Serum: 0.7 ng/mL (ref 0.0–4.0)

## 2018-10-31 NOTE — Patient Instructions (Addendum)
  HEALTH MAINTENANCE RECOMMENDATIONS:  It is recommended that you get at least 30 minutes of aerobic exercise at least 5 days/week (for weight loss, you may need as much as 60-90 minutes). This can be any activity that gets your heart rate up. This can be divided in 10-15 minute intervals if needed, but try and build up your endurance at least once a week.  Weight bearing exercise is also recommended twice weekly.  Eat a healthy diet with lots of vegetables, fruits and fiber.  "Colorful" foods have a lot of vitamins (ie green vegetables, tomatoes, red peppers, etc).  Limit sweet tea, regular sodas and alcoholic beverages, all of which has a lot of calories and sugar.  Up to 2 alcoholic drinks daily may be beneficial for men (unless trying to lose weight, watch sugars).  Drink a lot of water.  Sunscreen of at least SPF 30 should be used on all sun-exposed parts of the skin when outside between the hours of 10 am and 4 pm (not just when at beach or pool, but even with exercise, golf, tennis, and yard work!)  Use a sunscreen that says "broad spectrum" so it covers both UVA and UVB rays, and make sure to reapply every 1-2 hours.  Remember to change the batteries in your smoke detectors when changing your clock times in the spring and fall. Carbon monoxide detectors are recommended for your home.  Use your seat belt every time you are in a car, and please drive safely and not be distracted with cell phones and texting while driving.   [name] , Thank you for taking time to come for your Medicare Wellness Visit. I appreciate your ongoing commitment to your health goals. Please review the following plan we discussed and let me know if I can assist you in the future.   This is a list of the screening recommended for you and due dates:  Health Maintenance  Topic Date Due  . Flu Shot  12/22/2018  . Tetanus Vaccine  07/10/2022  . Colon Cancer Screening  11/11/2025  .  Hepatitis C: One time screening is  recommended by Center for Disease Control  (CDC) for  adults born from [birth year] through 1964.   Completed  . Pneumonia vaccines  Completed   Continue yearly high dose flu shots in the Fall (Sept/Oct)  Your liver tests were mildly elevated. Continue to avoid alcohol and use tylenol only occasionally.  We will schedule your for a 3 month recheck to ensure that the liver tests aren't getting any worse. Losing weight can help in case the liver has some fatty changes to it.  Please get [place] copies of your living will and healthcare power of attorney at your convenience.

## 2018-10-31 NOTE — Progress Notes (Signed)
Start time: 8:45 End time: 9:25   Virtual Visit via Video Note  I connected with Timothy Estes on 11/01/2018 by a video enabled telemedicine application and verified that I am speaking with the correct person using two identifiers.  Location: Patient: home, wife is also there Provider: office   I discussed the limitations of evaluation and management by telemedicine and the availability of in person appointments. The patient expressed understanding and agreed to proceed. He consents to filing his insurance for this visit.  History of Present Illness:  Chief Complaint  Patient presents with  . Medicare Wellness    no other concerns today    Patient presents for Annual Wellness visit, as well as 6 month follow-up on his chronic conditions. He had labs done prior to his visit, see below.  Since his last visit, he under went hip replacement (06/2018) with Dr. Alvan Dame. He is doing well, no complications.  He was cleared to f/u in 1 year.  Iron deficiency anemia.  EGD done 01/2018 showed H.pylori gastritis on gastric biopsies, reflux changes on esophageal biopsies.  He was treated by GI with Pylera and iron twice daily.  He denies any further problems.  He denies abdominal pain, melena or hematochezia.  Gout: Diagnosed in May, 2018when he presented with bilateral knee effusions. He was started on 300mg  of allopurinol daily, and f/u uric acid level was normal. He has colchicine to use prn for any flare. He has had flares of gout (at knee) in the past related to increased beer intake (last year). Last flare was was a year ago, none since. No longer drinking alcohol.  He was noted to also have severe degenerative disease in the left knee on x-ray. He currently denies significant knee pain.  Hypertension follow-up: Blood pressures elsewhere are 125-126/82-85 at home, checks every other day.  He reports his monitor was accurate. Denies headaches, dizziness, chest pain, edema, dyspnea with  exertion, side effects to medications.   Hyperlipidemia follow-up: Patient is reportedly following a low-fat, low cholesterol diet. Compliant with medications (crestor, niaspan and fish oil) and denies medication side effects. Medication was changed from atorvastatin to rosuvastatin in the past due to not reaching goal on atorvastatin. His diet hasn't changed--He uses 1% milk, has cereal every morning. He has 2-3 eggs/week, cut back on cheese. Red meat 2x/week. Mostly lots of chicken and [place].  Impaired fasting glucose--noted 07/2013. He had mildly elevated glucosein the past,with normal A1c's. He continues to follow a lower carb diet than in the past, eating how his diabetic wife eats.   H/oVitamin D deficiency--level was low at 28 at his physical 02/2015.Whentaking 1000 IU daily,f/u level was normal in October 2017 (41). He admits that lately he hasn't been taking the vitamin D daily, maybe every other day or less.  Paroxysmal atrial fibrillation and coronary artery disease, status post non-ST elevation MI, with preserved left ventricular systolic function:He is on Xarelto and ASA 81mg .He has been maintaining sinus rhythmas far as he knows. He denies any significant bleeding or bruising. His last visit with Dr. Lovena Le was in 09/2018 (virtual); no changes were made.   Immunization History  Administered Date(s) Administered  . Influenza Split 03/15/2012  . Influenza, High Dose Seasonal PF 04/03/2017, 03/30/2018  . Influenza,inj,Quad PF,6+ Mos 01/23/2013, 01/22/2014, 02/24/2015, 03/10/2016  . Pneumococcal Conjugate-13 09/22/2016  . Pneumococcal Polysaccharide-23 10/30/2017  . Tdap 07/10/2012  . Zoster 07/10/2012  . Zoster Recombinat (Shingrix) 12/09/2016, 04/04/2017   Last colonoscopy:10/2015 Dr. Fuller Plan (benign polyps, repeat  10 years) Last PSA: with recent labs (10/2018), 0.7 Dentist:Getting teeth cleaned regularly over the last year. Ophtho:yearly Exercise: hip  surgery 4 months ago, walking slowly, up and down driveway about 29-51 minutes daily.  He is also working in his garden.  Other doctors caring for patient include: GI: Dr. Fuller Plan Cardiology: Dr. Lovena Le Dentist:Dr. Lynnette Caffey Ophtho:Dr. Rona Ravens ([facility] in [place] (did cataract surgery) Oral surgeon: Dr. Luvenia Heller (removed wisdom teeth) Ortho: Dr. Alvan Dame   Depression screen:Negative Fall screen: none Functional status screen: unremarkable Mini-Cog screen:  Normal (clock drawing shown through video) See full questionnaires in Epic  End of Life Discussion: Patient does havea living will and medical power of attorney. He hasn't yet gotten [place] copies  Past Medical History:  Diagnosis Date  . Anemia   . Arthritis   . Atrial fibrillation with RVR ([place])    converted to NSR with IV dilt 10/12 - no coumadin due to need for Plavix and ASA  . Cataract   . Clotting disorder ([place])    PE 2012  . Colon polyps  10/2015   (non-adenomatous)  . Coronary artery disease    LHC 03/17/11: Dx 30%, OM1 30%, OM2 30%, pRCA 30%, EF 40-45% with anterolat and apical HK  . Diverticulosis  10/2015   noted on colonoscopy  . Elevated LFTs   . Fatty liver   . Hematochezia    LIKEY FROM HEMORRHOIDAL BLEEDING; PT HAS H/O  . Hyperlipidemia   . Hypertension   . Internal hemorrhoids  10/2015   noted on colonoscopy  . NSTEMI (non-ST elevated myocardial infarction) ([place])    2012-FLET THAT NON-ST-ELEVATION MI IS POSSIBLY SECONDARY TO CORONARY EMBOLUS FROM HIS AFIB  . Syncope    in setting of AFib with RVR    Past Surgical History:  Procedure Laterality Date  . CARDIAC CATHETERIZATION  03/18/11  . cardiac event monitor  04/01/11  . CATARACT EXTRACTION W/PHACO Left 09/18/2014   Procedure: CATARACT EXTRACTION PHACO AND INTRAOCULAR LENS PLACEMENT LEFT EYE;  Surgeon: Tonny Branch, MD;  Location: [facility];  Service: Ophthalmology;  Laterality: Left;  CDE 13.32  . COLONOSCOPY    .  ESOPHAGOGASTRODUODENOSCOPY ENDOSCOPY    . MOUTH SURGERY    . TOTAL HIP ARTHROPLASTY Left 07/10/2018   Procedure: TOTAL HIP ARTHROPLASTY ANTERIOR APPROACH;  Surgeon: Paralee Cancel, MD;  Location: [facility];  Service: Orthopedics;  Laterality: Left;  70 mins  . TRANSTHORACIC ECHOCARDIOGRAM  03/19/11  . WISDOM TOOTH EXTRACTION      Social History   Socioeconomic History  . Marital status: Married    Spouse name: Not on file  . Number of children: 0  . Years of education: Not on file  . Highest education level: Not on file  Occupational History  . Occupation: [employer] job 02/2013    Employer: [employer] Needs  . Financial resource strain: Not on file  . Food insecurity    Worry: Not on file    Inability: Not on file  . Transportation needs    Medical: Not on file    Non-medical: Not on file  Tobacco Use  . Smoking status: Former Smoker    Packs/day: 0.50    Years: 3.00    Pack years: 1.50    Types: Cigarettes    Quit date: 11/24/1974    Years since quitting: 43.9  . Smokeless tobacco: Never Used  Substance and Sexual Activity  . Alcohol use: Not Currently    Alcohol/week:  0.0 standard drinks  . Drug use: No  . Sexual activity: Yes    Partners: Female  Lifestyle  . Physical activity    Days per week: Not on file    Minutes per session: Not on file  . Stress: Not on file  Relationships  . Social Herbalist on phone: Not on file    Gets together: Not on file    Attends religious service: Not on file    Active member of club or organization: Not on file    Attends meetings of clubs or organizations: Not on file    Relationship status: Not on file  . Intimate partner violence    Fear of current or ex partner: Not on file    Emotionally abused: Not on file    Physically abused: Not on file    Forced sexual activity: Not on file  Other Topics Concern  . Not on file  Social History Narrative   Lives with wife.  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 Hyperlipidemia Brother   . Hypertension Brother   . Arthritis Mother   . Heart disease Neg Hx   . Cancer Neg Hx   . Diabetes Neg Hx   . Colon cancer Neg Hx   . Esophageal cancer Neg Hx   . Rectal cancer Neg Hx   . Stomach cancer Neg Hx     Outpatient Encounter Medications as of 11/01/2018  Medication Sig Note  . allopurinol (ZYLOPRIM) 300 MG tablet TAKE 1 TABLET (300 MG TOTAL) DAILY BY MOUTH.   Marland Kitchen aspirin 81 MG EC tablet Take 81 mg by mouth daily. Swallow whole.   . cholecalciferol (VITAMIN D) 1000 units tablet Take 1,000 Units by mouth daily. 11/01/2018: Has been taking every other day (or less)  . Coenzyme Q-10 100 MG capsule Take 1 tablet by mouth daily.    . fish oil-omega-3 fatty acids 1000 MG capsule Take 2 g by mouth 2 (two) times daily.    . metoprolol tartrate (LOPRESSOR) 25 MG tablet TAKE 1 TABLET (25 MG TOTAL) BY MOUTH 2 (TWO) TIMES DAILY.   . niacin (NIASPAN) 1000 MG CR tablet TAKE 1 TABLET (1,000 MG TOTAL) BY MOUTH AT BEDTIME.   . rosuvastatin (CRESTOR) 40 MG tablet TAKE 1 TABLET (40 MG TOTAL) DAILY BY MOUTH.   Alveda Reasons 20 MG TABS tablet TAKE 1 TABLET (20 MG TOTAL) BY MOUTH DAILY WITH SUPPER.   . nitroGLYCERIN (NITROSTAT) 0.4 MG SL tablet Place 1 tablet (0.4 mg total) every 5 (five) minutes as needed under the tongue for chest pain. (Patient not taking: Reported on 11/01/2018)   . polyethylene glycol (MIRALAX / GLYCOLAX) packet Take 17 g by mouth 2 (two) times daily. (Patient not taking: Reported on 11/01/2018)    No facility-administered encounter medications on file as of 11/01/2018.     No Known Allergies   ROS: The patient denies anorexia, fever, headaches, weight changes, decreased hearing, ear pain, hoarseness, chest pain, palpitations, dizziness, syncope, dyspnea on exertion, cough, swelling, nausea, vomiting, diarrhea, constipation, abdominal pain, melena,  hematochezia, indigestion/heartburn, hematuria, incontinence, erectile dysfunction, nocturia, weakened urine stream, dysuria, genital lesions, numbness, tingling, weakness, tremor, suspicious skin lesions, depression, anxiety, abnormal bleeding/bruising, or enlarged lymph nodes. Denies memory issues. +snores (per wife). Feels well-rested in the mornings. Denies daytime somnolence. Wife denies apnea. (reviewed--denies any change)  Occasional  runny nose related to allergies, not currently.  Denies joint pains (some hip pain depending on sleep position); no knee pain.    Observations/Objective:  BP 126/82   Pulse 76   Temp 98.4 F (36.9 C)   Ht 5\' 10"  (1.778 m)   Wt 219 lb (99.3 kg)   BMI 31.42 kg/m   Wt Readings from Last 3 Encounters:  10/17/18 219 lb (99.3 kg)  07/10/18 230 lb (104.3 kg)  07/04/18 230 lb 12.8 oz (104.7 kg)   He appears alert, oriented, cranial nerves are grossly intact. He is in good spirits, normal eye contact, speech, grooming Exam is limited due to virtual nature of the visit.   Labs: Lab Results  Component Value Date   HGBA1C 5.5 10/30/2018     Chemistry      Component Value Date/Time   NA 140 10/30/2018 0929   K 4.7 10/30/2018 0929   CL 103 10/30/2018 0929   CO2 21 10/30/2018 0929   BUN 15 10/30/2018 0929   CREATININE 1.18 10/30/2018 0929   CREATININE 1.05 09/03/2015 0001      Component Value Date/Time   CALCIUM 9.4 10/30/2018 0929   ALKPHOS 77 10/30/2018 0929   AST 56 (H) 10/30/2018 0929   ALT 53 (H) 10/30/2018 0929   BILITOT 0.4 10/30/2018 0929     Fasting glucose 106  PSA 0.7  Lab Results  Component Value Date   CHOL 154 10/30/2018   HDL 43 10/30/2018   LDLCALC 78 10/30/2018   TRIG 164 (H) 10/30/2018   CHOLHDL 3.6 10/30/2018   Lab Results  Component Value Date   WBC 7.7 10/30/2018   HGB 14.5 10/30/2018   HCT 42.5 10/30/2018   MCV 81 10/30/2018   PLT 212 10/30/2018   Lab Results  Component Value Date   LABURIC 4.0  10/30/2018     Assessment and Plan:  Medicare annual wellness visit, subsequent   Mixed hyperlipidemia - TG mildly elevated. diet reviewed. Cont current meds - Plan: niacin (NIASPAN) 1000 MG CR tablet, rosuvastatin (CRESTOR) 40 MG tablet  Osteoarthritis of left knee, unspecified osteoarthritis type - doing well, not causing any pain currently. Weight loss encouraged   Gout of knee, unspecified cause, unspecified chronicity, unspecified laterality - continue allopurinol; uric acid at goal - Plan: allopurinol (ZYLOPRIM) 300 MG tablet  Essential hypertension - well controlled  Impaired fasting glucose - discussed proper diet, exercise and weight loss  Coronary artery disease involving native coronary artery of native heart without angina pectoris - stable; asymptomatic - Plan: rosuvastatin (CRESTOR) 40 MG tablet  Elevated LFTs - ddx reviewed. Encouraged weight loss. cont to avoid alcohol. recheck 3 mos - Plan: Hepatic function panel  Obesity with comorbidity--counseled re: portion control, diet, weight loss recommended.   Discussed PSA screening (risks/benefits), recommended at least 30 minutes of aerobic activity at least 5 days/week; proper sunscreen use reviewed; healthy diet and alcohol recommendations (less than or equal to 2 drinks/day) reviewed; regular seatbelt use; changing batteries in smoke detectors. Self-testicular exams. Immunization recommendations discussed--continue yearly flu shots. Colonoscopy recommendations reviewed--UTD.   MOST form reviewed, unchanged (unable to sign due to virtual nature of the visit).  Full Code, Full Care Encouraged to get [place] copies of living will and healthcare POA paperwork   CPE 6 months Lab visit 3 months (nonfasting)   Follow Up Instructions:    I discussed the assessment and treatment plan with the patient. The patient was provided an opportunity to ask questions and all were  answered. The patient agreed with the plan and  demonstrated an understanding of the instructions.   The patient was advised to call back or seek an in-person evaluation if the symptoms worsen or if the condition fails to improve as anticipated.  I provided 40 minutes of non-face-to-face time during this encounter.   Vikki Ports, MD  Medicare Attestation I have personally reviewed: The patient's medical and social history Their use of alcohol, tobacco or illicit drugs Their current medications and supplements The patient's functional ability including ADLs,fall risks, home safety risks, cognitive, and hearing and visual impairment Diet and physical activities Evidence for depression or mood disorders  The patient's weight, height and BMI have been recorded in the chart.  I have made referrals, counseling, and provided education to the patient based on review of the above and I have provided the patient with a written personalized care plan for preventive services.

## 2018-11-01 ENCOUNTER — Other Ambulatory Visit: Payer: Self-pay

## 2018-11-01 ENCOUNTER — Ambulatory Visit: Payer: Medicare Other | Admitting: Family Medicine

## 2018-11-01 ENCOUNTER — Encounter: Payer: Self-pay | Admitting: Family Medicine

## 2018-11-01 VITALS — BP 126/82 | HR 76 | Temp 98.4°F | Ht 70.0 in | Wt 219.0 lb

## 2018-11-01 DIAGNOSIS — I2510 Atherosclerotic heart disease of native coronary artery without angina pectoris: Secondary | ICD-10-CM

## 2018-11-01 DIAGNOSIS — M109 Gout, unspecified: Secondary | ICD-10-CM | POA: Diagnosis not present

## 2018-11-01 DIAGNOSIS — M1712 Unilateral primary osteoarthritis, left knee: Secondary | ICD-10-CM

## 2018-11-01 DIAGNOSIS — E782 Mixed hyperlipidemia: Secondary | ICD-10-CM | POA: Diagnosis not present

## 2018-11-01 DIAGNOSIS — R7989 Other specified abnormal findings of blood chemistry: Secondary | ICD-10-CM

## 2018-11-01 DIAGNOSIS — Z6831 Body mass index (BMI) 31.0-31.9, adult: Secondary | ICD-10-CM | POA: Diagnosis not present

## 2018-11-01 DIAGNOSIS — I10 Essential (primary) hypertension: Secondary | ICD-10-CM | POA: Diagnosis not present

## 2018-11-01 DIAGNOSIS — Z0000 Encounter for general adult medical examination without abnormal findings: Secondary | ICD-10-CM | POA: Diagnosis not present

## 2018-11-01 DIAGNOSIS — E6609 Other obesity due to excess calories: Secondary | ICD-10-CM

## 2018-11-01 DIAGNOSIS — R945 Abnormal results of liver function studies: Secondary | ICD-10-CM

## 2018-11-01 DIAGNOSIS — R7301 Impaired fasting glucose: Secondary | ICD-10-CM

## 2018-11-01 MED ORDER — ROSUVASTATIN CALCIUM 40 MG PO TABS: 40.0000 mg | ORAL_TABLET | Freq: Every day | ORAL | 1 refills | Status: DC

## 2018-11-01 MED ORDER — NIACIN ER (ANTIHYPERLIPIDEMIC) 1000 MG PO TBCR: EXTENDED_RELEASE_TABLET | ORAL | 5 refills | Status: DC

## 2018-11-01 MED ORDER — ALLOPURINOL 300 MG PO TABS: 300.0000 mg | ORAL_TABLET | Freq: Every day | ORAL | 3 refills | Status: DC

## 2018-11-01 NOTE — Progress Notes (Signed)
Done

## 2018-11-11 ENCOUNTER — Other Ambulatory Visit: Payer: Self-pay | Admitting: Internal Medicine

## 2018-11-11 DIAGNOSIS — I480 Paroxysmal atrial fibrillation: Secondary | ICD-10-CM

## 2018-12-24 ENCOUNTER — Other Ambulatory Visit: Payer: Self-pay | Admitting: Family Medicine

## 2018-12-24 DIAGNOSIS — I2510 Atherosclerotic heart disease of native coronary artery without angina pectoris: Secondary | ICD-10-CM

## 2018-12-24 DIAGNOSIS — I10 Essential (primary) hypertension: Secondary | ICD-10-CM

## 2019-02-04 ENCOUNTER — Other Ambulatory Visit: Payer: Medicare Other

## 2019-02-04 ENCOUNTER — Other Ambulatory Visit: Payer: Self-pay

## 2019-02-04 DIAGNOSIS — R7989 Other specified abnormal findings of blood chemistry: Secondary | ICD-10-CM

## 2019-02-05 LAB — HEPATIC FUNCTION PANEL
ALT: 38 IU/L (ref 0–44)
AST: 42 IU/L — ABNORMAL HIGH (ref 0–40)
Albumin: 4.4 g/dL (ref 3.8–4.8)
Alkaline Phosphatase: 70 IU/L (ref 39–117)
Bilirubin Total: 0.6 mg/dL (ref 0.0–1.2)
Bilirubin, Direct: 0.17 mg/dL (ref 0.00–0.40)
Total Protein: 7.2 g/dL (ref 6.0–8.5)

## 2019-02-18 ENCOUNTER — Other Ambulatory Visit (INDEPENDENT_AMBULATORY_CARE_PROVIDER_SITE_OTHER): Payer: Medicare Other

## 2019-02-18 ENCOUNTER — Other Ambulatory Visit: Payer: Self-pay

## 2019-02-18 DIAGNOSIS — Z23 Encounter for immunization: Secondary | ICD-10-CM

## 2019-05-01 ENCOUNTER — Other Ambulatory Visit: Payer: Self-pay | Admitting: *Deleted

## 2019-05-01 DIAGNOSIS — E783 Hyperchylomicronemia: Secondary | ICD-10-CM

## 2019-05-01 DIAGNOSIS — Z5181 Encounter for therapeutic drug level monitoring: Secondary | ICD-10-CM

## 2019-05-06 ENCOUNTER — Other Ambulatory Visit: Payer: Self-pay

## 2019-05-06 ENCOUNTER — Other Ambulatory Visit: Payer: Medicare Other

## 2019-05-06 DIAGNOSIS — E783 Hyperchylomicronemia: Secondary | ICD-10-CM

## 2019-05-06 DIAGNOSIS — Z5181 Encounter for therapeutic drug level monitoring: Secondary | ICD-10-CM

## 2019-05-06 LAB — LIPID PANEL
Chol/HDL Ratio: 3.3 ratio (ref 0.0–5.0)
Cholesterol, Total: 156 mg/dL (ref 100–199)
HDL: 47 mg/dL (ref >39)
LDL Chol Calc (NIH): 75 mg/dL (ref 0–99)
Triglycerides: 205 mg/dL — ABNORMAL HIGH (ref 0–149)
VLDL Cholesterol Cal: 34 mg/dL (ref 5–40)

## 2019-05-07 LAB — COMPREHENSIVE METABOLIC PANEL
ALT: 50 IU/L — ABNORMAL HIGH (ref 0–44)
AST: 53 IU/L — ABNORMAL HIGH (ref 0–40)
Albumin/Globulin Ratio: 1.6 (ref 1.2–2.2)
Albumin: 4.6 g/dL (ref 3.8–4.8)
Alkaline Phosphatase: 68 IU/L (ref 39–117)
BUN/Creatinine Ratio: 15 (ref 10–24)
BUN: 21 mg/dL (ref 8–27)
Bilirubin Total: 0.7 mg/dL (ref 0.0–1.2)
CO2: 17 mmol/L — ABNORMAL LOW (ref 20–29)
Calcium: 9.5 mg/dL (ref 8.6–10.2)
Chloride: 105 mmol/L (ref 96–106)
Creatinine, Ser: 1.43 mg/dL — ABNORMAL HIGH (ref 0.76–1.27)
GFR calc Af Amer: 58 mL/min/{1.73_m2} — ABNORMAL LOW (ref >59)
GFR calc non Af Amer: 50 mL/min/{1.73_m2} — ABNORMAL LOW (ref >59)
Globulin, Total: 2.8 g/dL (ref 1.5–4.5)
Glucose: 98 mg/dL (ref 65–99)
Potassium: 4.4 mmol/L (ref 3.5–5.2)
Sodium: 140 mmol/L (ref 134–144)
Total Protein: 7.4 g/dL (ref 6.0–8.5)

## 2019-05-08 NOTE — Progress Notes (Signed)
Chief Complaint  Patient presents with  . Medicare Wellness    nonfasting CPE/med check. No new concerns.     Timothy Estes is a 68 y.o. male who presents for a complete physical and follow-up on chronic problems.  He had virtual AWV six months ago.  He had labs done prior to his visit, see below.  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  He will ultimately need surgery on the knee (Dr. Alvan Dame).  Hypertension follow-up: Blood pressure was 122/84 last night at home, usually checks once daily in the morning and runs 120's/80's. Was stuck in traffic today, which he cites as the reason for high blood pressure today. He reports his monitor was accurate. He is compliant with metoprolol 25mg  BID and denies side effects. Denies headaches, dizziness, chest pain, edema, dyspnea with exertion.  Hyperlipidemia follow-up: Patient is reportedly following a low-fat, low cholesterol diet. Compliant with medications (crestor, niaspan and fish oil) and denies medication side effects. Medication was changed from atorvastatin to rosuvastatin in the past due to not reaching goal on atorvastatin. His diet hasn't changed--He uses 1% milk, has cereal every morning. He has 2-3 eggs/week, cut back on cheese. Red meat 2x/week. Mostly lots of chicken and [place].Not much fried foods, since hasn't been eating out.  Denies snacking much, occ peanut butter nabs.   Impaired fasting glucose--noted 07/2013. He had mildly elevated glucosein the past,with normal A1c's. He continues to follow a lower carb diet than in the past, eating how his diabetic wife eats.   H/oVitamin D deficiency--level was low at 28 at his physical 02/2015.Whentaking 1000 IU daily,f/u level was normal in October 2017 (41).  He has been taking 1000 IU about 3-4x/week.  Paroxysmal atrial fibrillation and coronary artery disease, status post non-ST elevation MI, with preserved left  ventricular systolic function:He is on Xarelto and ASA 81mg .He has been maintaining sinus rhythmas far as he knows (since 2012 per pt). He denies any significant bleeding or bruising. His last visit with Dr. Lovena Le was in 09/2018 (virtual); no changes were made.Has upcoming appt.  History of iron deficiency anemia. EGD done 01/2018 showed H.pylori gastritis on gastric biopsies, reflux changes on esophageal biopsies. He was treated by GI with Pylera and iron twice daily.  He denies any further problems.  He denies abdominal pain, melena or hematochezia. Lab Results  Component Value Date   WBC 7.7 10/30/2018   HGB 14.5 10/30/2018   HCT 42.5 10/30/2018   MCV 81 10/30/2018   PLT 212 10/30/2018   Lab Results  Component Value Date   FERRITIN 39 05/10/2018   Gout: Diagnosed in May, 2018when he presented with bilateral knee effusions. He was started on 300mg  of allopurinol daily, and f/u uric acid level was normal. He has colchicine to use prn for any flare.He has had flares of gout (at knee) in the past related to increased beer intake. He is no longer drinking alcohol, last flare was over a year ago. Has very few beer, last was a couple of weeks ago.  Lab Results  Component Value Date   LABURIC 4.0 10/30/2018     Immunization History  Administered Date(s) Administered  . Fluad Quad(high Dose 65+) 02/18/2019  . Influenza Split 03/15/2012  . Influenza, High Dose Seasonal PF 04/03/2017, 03/30/2018  . Influenza,inj,Quad PF,6+ Mos 01/23/2013, 01/22/2014, 02/24/2015, 03/10/2016  . Influenza,inj,quad, With Preservative 02/20/2017  . Pneumococcal Conjugate-13 09/22/2016  .  Pneumococcal Polysaccharide-23 10/30/2017  . Tdap 07/10/2012  . Zoster 07/10/2012  . Zoster Recombinat (Shingrix) 12/09/2016, 04/04/2017   Last colonoscopy:10/2015 Dr. Fuller Plan (benign polyps, repeat 10 years) Last PSA: Lab Results  Component Value Date   PSA1 0.7 10/30/2018   PSA1 0.6 10/26/2017   PSA 0.5  09/22/2016   PSA 0.54 09/03/2015   PSA 0.44 07/24/2014   Dentist:Getting teeth cleaned regularly over the last year. Cleanings every  3 months. Ophtho:yearly Exercise:  Since COVID, hasn't been getting out much to walk Cutting trees down with chainsaw and other yardwork  PMH, PSH, SH and FH reviewed and updated  Outpatient Encounter Medications as of 05/09/2019  Medication Sig Note  . allopurinol (ZYLOPRIM) 300 MG tablet Take 1 tablet (300 mg total) by mouth daily.   Marland Kitchen aspirin 81 MG EC tablet Take 81 mg by mouth daily. Swallow whole.   . cholecalciferol (VITAMIN D) 1000 units tablet Take 1,000 Units by mouth daily. 11/01/2018: Has been taking every other day (or less)  . Coenzyme Q-10 100 MG capsule Take 1 tablet by mouth daily.    . fish oil-omega-3 fatty acids 1000 MG capsule Take 2 g by mouth 2 (two) times daily.    . metoprolol tartrate (LOPRESSOR) 25 MG tablet TAKE 1 TABLET BY MOUTH TWICE A DAY   . niacin (NIASPAN) 1000 MG CR tablet TAKE 1 TABLET (1,000 MG TOTAL) BY MOUTH AT BEDTIME.   . rosuvastatin (CRESTOR) 40 MG tablet Take 1 tablet (40 mg total) by mouth daily.   Alveda Reasons 20 MG TABS tablet TAKE 1 TABLET (20 MG TOTAL) BY MOUTH DAILY WITH SUPPER.   . nitroGLYCERIN (NITROSTAT) 0.4 MG SL tablet Place 1 tablet (0.4 mg total) every 5 (five) minutes as needed under the tongue for chest pain. (Patient not taking: Reported on 11/01/2018)   . [DISCONTINUED] polyethylene glycol (MIRALAX / GLYCOLAX) packet Take 17 g by mouth 2 (two) times daily. (Patient not taking: Reported on 11/01/2018)    No facility-administered encounter medications on file as of 05/09/2019.   No Known Allergies   ROS: The patient denies anorexia, fever, headaches, weight changes, decreased hearing, ear pain, hoarseness, chest pain, palpitations, dizziness, syncope, dyspnea on exertion, cough, swelling, nausea, vomiting, diarrhea, constipation, abdominal pain, melena, hematochezia, indigestion/heartburn,  hematuria, incontinence, erectile dysfunction, nocturia, weakened urine stream, dysuria, genital lesions, numbness, tingling, weakness, tremor, suspicious skin lesions, depression, anxiety, abnormal bleeding/bruising (just some bruising on forearms), or enlarged lymph nodes. Denies memory issues. +snores (per wife). Feels well-rested in the mornings. Denies daytime somnolence. Wife denies apnea. (reviewed--denies any change)  Occasional runny nose related to allergies, not currently.  L knee pain, OA.   PHYSICAL EXAM:  BP (!) 180/110   Pulse 68   Temp 98.4 F (36.9 C) (Other (Comment))   Ht 5\' 10"  (1.778 m)   Wt 235 lb 6.4 oz (106.8 kg)   BMI 33.78 kg/m   170/94 on repeat by MD Repeated at end of visit by nurse 152/90  Wt Readings from Last 3 Encounters:  05/09/19 235 lb 6.4 oz (106.8 kg)  11/01/18 219 lb (99.3 kg)  10/17/18 219 lb (99.3 kg)    General Appearance:  Alert, cooperative, no distress, appears stated age.  Head:  Normocephalic, without obvious abnormality, atraumatic   Eyes:  PERRL, conjunctiva/corneas clear, EOM's intact, fundi benign   Ears:  Normal TM's and external ear canals   Nose:  Not examined, wearing mask due to COVID-19 pandemic   Throat:  Not examined,  wearing mask due to COVID-19 pandemic  Neck:  Supple, no lymphadenopathy; thyroid: no enlargement/ tenderness/nodules; no carotid bruit or JVD   Back:  Spine nontender, no curvature, ROM normal, no CVA tenderness   Lungs:  Clear to auscultation bilaterally without wheezes, rales or ronchi; respirations unlabored.   Chest Wall:  No tenderness or deformity   Heart:  Regular rate and rhythm, S1 and S2 normal, no murmur, rub or gallop   Breast Exam:  No chest wall tenderness, masses or gynecomastia   Abdomen:  Soft, non-tender, nondistended, normoactive bowel sounds,  no masses, no hepatosplenomegaly   Genitalia:  normal external genitalia, circumsized, no  lesions. Testicles are normal, no masses, no hernias  Rectal:  Normal sphincter tone, no masses.  Heme negative stool. Prostate is normal, not enlarged, no nodule.  Extremities:  No clubbing, cyanosis or edema.Knees without erythema or warmth  Pulses:  2+ and symmetric all extremities   Skin:  Skin color, texture, turgor normal, no rashes or lesions.Healing scabs at knees bilaterally (denies fall--scraped by something that fell out of his truck).  Lymph nodes:  Cervical, supraclavicular, and axillary nodes normal   Neurologic:  Normal strength, sensation; uses can, abnormal gait (due to pain)t; reflexes 2+ and symmetric throughout    Psych: Normal mood, affect, hygiene and grooming     Chemistry      Component Value Date/Time   NA 140 05/06/2019 0921   K 4.4 05/06/2019 0921   CL 105 05/06/2019 0921   CO2 17 (L) 05/06/2019 0921   BUN 21 05/06/2019 0921   CREATININE 1.43 (H) 05/06/2019 0921   CREATININE 1.05 09/03/2015 0001      Component Value Date/Time   CALCIUM 9.5 05/06/2019 0921   ALKPHOS 68 05/06/2019 0921   AST 53 (H) 05/06/2019 0921   ALT 50 (H) 05/06/2019 0921   BILITOT 0.7 05/06/2019 0921     Fasting glucose 98  Lab Results  Component Value Date   CHOL 156 05/06/2019   HDL 47 05/06/2019   LDLCALC 75 05/06/2019   TRIG 205 (H) 05/06/2019   CHOLHDL 3.3 05/06/2019    ASSESSMENT/PLAN:  Annual physical exam  Essential hypertension - elevated today, normal at home; ensure monitor accurate; low Na diet, cont to monitor  Gout of knee, unspecified cause, unspecified chronicity, unspecified laterality - cont allopurinol  Coronary artery disease involving native coronary artery of native heart without angina pectoris - Plan: rosuvastatin (CRESTOR) 40 MG tablet  Vitamin D deficiency - encouraged daily supplement  Atrial fibrillation, unspecified type (HCC) - rate controlled; cont anticoag  Elevated serum creatinine -  reviewed w/pt. cont to avoid NSAIDs and stay well hydrated. Will monitor - Plan: Comprehensive metabolic panel  Mixed hyperlipidemia - TG mildly elevated. diet reviewed. Cont current meds - Plan: niacin (NIASPAN) 1000 MG CR tablet, rosuvastatin (CRESTOR) 40 MG tablet  Coronary artery disease involving native coronary artery of native heart without angina pectoris - stable; asymptomatic - Plan: rosuvastatin (CRESTOR) 40 MG tablet  Elevated LFTs - reviewed Ddx, encouraged lowfat diet and wt loss. Monitor - Plan: Comprehensive metabolic panel   Drink more water Recheck kidney and liver tests in 1 month May need further evauation , including imaging.  TG above goal; diet reviewed  Sent labs to Dr. Lovena Le  Discussed PSA screening (risks/benefits), recommended at least 30 minutes of aerobic activity at least 5 days/week, weight-bearing exercise at least 2x/week; proper sunscreen use reviewed; healthy diet and alcohol recommendations (less than or equal to 2  drinks/day) reviewed; regular seatbelt use; changing batteries in smoke detectors. Immunization recommendations discussed--continue yearly flu shots. COVID vaccine when available. Colonoscopy recommendations reviewed--UTD.  Discussed/recommended  [facility] clinic--wants to try on his own first.  MOST formreviewed, updated. Full Code, Full Care Encouraged to get [place] copies of living will and healthcare POA paperwork

## 2019-05-08 NOTE — Patient Instructions (Addendum)
HEALTH MAINTENANCE RECOMMENDATIONS:  It is recommended that you get at least 30 minutes of aerobic exercise at least 5 days/week (for weight loss, you may need as much as 60-90 minutes). This can be any activity that gets your heart rate up. This can be divided in 10-15 minute intervals if needed, but try and build up your endurance at least once a week.  Weight bearing exercise is also recommended twice weekly.  Eat a healthy diet with lots of vegetables, fruits and fiber.  "Colorful" foods have a lot of vitamins (ie green vegetables, tomatoes, red peppers, etc).  Limit sweet tea, regular sodas and alcoholic beverages, all of which has a lot of calories and sugar.  Up to 2 alcoholic drinks daily may be beneficial for men (unless trying to lose weight, watch sugars).  Drink a lot of water.  Sunscreen of at least SPF 30 should be used on all sun-exposed parts of the skin when outside between the hours of 10 am and 4 pm (not just when at beach or pool, but even with exercise, golf, tennis, and yard work!)  Use a sunscreen that says "broad spectrum" so it covers both UVA and UVB rays, and make sure to reapply every 1-2 hours.  Remember to change the batteries in your smoke detectors when changing your clock times in the spring and fall. Carbon monoxide detectors are recommended for your home.  Use your seat belt every time you are in a car, and please drive safely and not be distracted with cell phones and texting while driving.  Please get [place] copies of your living will and healthcare power of attorney, to be scanned into your chart, at your convenience  Please try and take your vitamin D supplement every day.  Your kidney tests and liver tests were elevated on your recent labs. I'm unsure why the kidney tests are abnormal. It is important that we make sure your blood pressure is normal.  Bring your monitor with you to your lab visit in a month to ensure that it is accurate, and that we can trust your  normal readings you are getting at home. Be sure to stay well hydrated. The liver test elevation is likely related to being overweight and elevated triglycerides.  Please work hard to cut out fats and sweets from the diet, and work on losing weight.  I highly recommend that you be seen at the [facility] through [facility].  You elected to hold off on this referral at this time, promised to work on it yourself.   High Triglycerides Eating Plan Triglycerides are a type of fat in the blood. High levels of triglycerides can increase your risk of heart disease and stroke. If your triglyceride levels are high, choosing the right foods can help lower your triglycerides and keep your heart healthy. Work with your health care provider or a diet and nutrition specialist (dietitian) to develop an eating plan that is right for you. What are tips for following this plan? General guidelines   Lose weight, if you are overweight. For most people, losing 5-10 lbs (2-5 kg) helps lower triglyceride levels. A weight-loss plan may include. ? 30 minutes of exercise at least 5 days a week. ? Reducing the amount of calories, sugar, and fat you eat.  Eat a wide variety of fresh fruits, vegetables, and whole grains. These foods are high in fiber.  Eat foods that contain healthy fats, such as fatty fish, nuts, seeds, and  olive oil.  Avoid foods that are high in added sugar, added salt (sodium), saturated fat, and trans fat.  Avoid low-fiber, refined carbohydrates such as white bread, crackers, noodles, and white rice.  Avoid foods with partially hydrogenated oils (trans fats), such as fried foods or stick margarine.  Limit alcohol intake to no more than 1 drink a day for nonpregnant women and 2 drinks a day for men. One drink equals 12 oz of beer, 5 oz of wine, or 1 oz of hard liquor. Your health care provider may recommend that you drink less depending on your overall health. Reading food  labels  Check food labels for the amount of saturated fat. Choose foods with no or very little saturated fat.  Check food labels for the amount of trans fat. Choose foods with no trans fat.  Check food labels for the amount of cholesterol. Choose foods low in cholesterol. Ask your dietitian how much cholesterol you should have each day.  Check food labels for the amount of sodium. Choose foods with less than 140 milligrams (mg) per serving. Shopping  Buy dairy products labeled as nonfat (skim) or low-fat (1%).  Avoid buying processed or prepackaged foods. These are often high in added sugar, sodium, and fat. Cooking  Choose healthy fats when cooking, such as olive oil or canola oil.  Cook foods using lower fat methods, such as baking, broiling, boiling, or grilling.  Make your own sauces, dressings, and marinades when possible, instead of buying them. Store-bought sauces, dressings, and marinades are often high in sodium and sugar. Meal planning  Eat more home-cooked food and less restaurant, buffet, and fast food.  Eat fatty fish at least 2 times each week. Examples of fatty fish include salmon, trout, mackerel, tuna, and herring.  If you eat whole eggs, do not eat more than 3 egg yolks per week. What foods are recommended? The items listed may not be a complete list. Talk with your dietitian about what dietary choices are best for you. Grains Whole wheat or whole grain breads, crackers, cereals, and pasta. Unsweetened oatmeal. Bulgur. Barley. Quinoa. Brown rice. Whole wheat flour tortillas. Vegetables Fresh or frozen vegetables. Low-sodium canned vegetables. Fruits All fresh, canned (in natural juice), or frozen fruits. Meats and other protein foods Skinless chicken or [place]. Ground chicken or [place]. Lean cuts of pork, trimmed of fat. Fish and seafood, especially salmon, trout, and herring. Egg whites. Dried beans, peas, or lentils. Unsalted nuts or seeds. Unsalted canned  beans. Natural peanut or almond butter. Dairy Low-fat dairy products. Skim or low-fat (1%) milk. Reduced fat (2%) and low-sodium cheese. Low-fat ricotta cheese. Low-fat cottage cheese. Plain, low-fat yogurt. Fats and oils Tub margarine without trans fats. Light or reduced-fat mayonnaise. Light or reduced-fat salad dressings. Avocado. Safflower, olive, sunflower, soybean, and canola oils. What foods are not recommended? The items listed may not be a complete list. Talk with your dietitian about what dietary choices are best for you. Grains White bread. White (regular) pasta. White rice. Cornbread. Bagels. Pastries. Crackers that contain trans fat. Vegetables Creamed or fried vegetables. Vegetables in a cheese sauce. Fruits Sweetened dried fruit. Canned fruit in syrup. Fruit juice. Meats and other protein foods Fatty cuts of meat. Ribs. Chicken wings. Berniece Salines. Sausage. Bologna. Salami. Chitterlings. Fatback. Hot dogs. Bratwurst. Packaged lunch meats. Dairy Whole or reduced-fat (2%) milk. Half-and-half. Cream cheese. Full-fat or sweetened yogurt. Full-fat cheese. Nondairy creamers. Whipped toppings. Processed cheese or cheese spreads. Cheese curds. Beverages Alcohol. Sweetened drinks, such as  soda, lemonade, fruit drinks, or punches. Fats and oils Butter. Stick margarine. Lard. Shortening. Ghee. Bacon fat. Tropical oils, such as coconut, palm kernel, or palm oils. Sweets and desserts Corn syrup. Sugars. Honey. Molasses. Candy. Jam and jelly. Syrup. Sweetened cereals. Cookies. Pies. Cakes. Donuts. Muffins. Ice cream. Condiments Store-bought sauces, dressings, and marinades that are high in sugar, such as ketchup and barbecue sauce. Summary  High levels of triglycerides can increase the risk of heart disease and stroke. Choosing the right foods can help lower your triglycerides.  Eat plenty of fresh fruits, vegetables, and whole grains. Choose low-fat dairy and lean meats. Eat fatty fish at  least twice a week.  Avoid processed and prepackaged foods with added sugar, sodium, saturated fat, and trans fat.  If you need suggestions or have questions about what types of food are good for you, talk with your health care provider or a dietitian. This information is not intended to replace advice given to you by your health care provider. Make sure you discuss any questions you have with your health care provider. Document Released: 02/25/2004 Document Revised: 04/21/2017 Document Reviewed: 07/12/2016 Elsevier Patient Education  2020 [employer].

## 2019-05-09 ENCOUNTER — Other Ambulatory Visit: Payer: Self-pay

## 2019-05-09 ENCOUNTER — Ambulatory Visit: Payer: Medicare Other | Admitting: Family Medicine

## 2019-05-09 ENCOUNTER — Encounter: Payer: Self-pay | Admitting: Family Medicine

## 2019-05-09 VITALS — BP 152/90 | HR 68 | Temp 98.4°F | Ht 70.0 in | Wt 235.4 lb

## 2019-05-09 DIAGNOSIS — E559 Vitamin D deficiency, unspecified: Secondary | ICD-10-CM | POA: Diagnosis not present

## 2019-05-09 DIAGNOSIS — E782 Mixed hyperlipidemia: Secondary | ICD-10-CM

## 2019-05-09 DIAGNOSIS — I10 Essential (primary) hypertension: Secondary | ICD-10-CM

## 2019-05-09 DIAGNOSIS — R7989 Other specified abnormal findings of blood chemistry: Secondary | ICD-10-CM

## 2019-05-09 DIAGNOSIS — I2510 Atherosclerotic heart disease of native coronary artery without angina pectoris: Secondary | ICD-10-CM | POA: Diagnosis not present

## 2019-05-09 DIAGNOSIS — Z0000 Encounter for general adult medical examination without abnormal findings: Secondary | ICD-10-CM | POA: Diagnosis not present

## 2019-05-09 DIAGNOSIS — M109 Gout, unspecified: Secondary | ICD-10-CM | POA: Diagnosis not present

## 2019-05-09 DIAGNOSIS — I4891 Unspecified atrial fibrillation: Secondary | ICD-10-CM

## 2019-05-09 MED ORDER — ROSUVASTATIN CALCIUM 40 MG PO TABS: 40.0000 mg | ORAL_TABLET | Freq: Every day | ORAL | 1 refills | Status: DC

## 2019-05-09 MED ORDER — NIACIN ER (ANTIHYPERLIPIDEMIC) 1000 MG PO TBCR: EXTENDED_RELEASE_TABLET | ORAL | 5 refills | Status: DC

## 2019-06-06 ENCOUNTER — Telehealth: Payer: Self-pay | Admitting: *Deleted

## 2019-06-06 ENCOUNTER — Other Ambulatory Visit: Payer: Self-pay

## 2019-06-06 ENCOUNTER — Other Ambulatory Visit: Payer: Medicare PPO

## 2019-06-06 DIAGNOSIS — R7989 Other specified abnormal findings of blood chemistry: Secondary | ICD-10-CM

## 2019-06-06 NOTE — Telephone Encounter (Signed)
Patient was here for labs and bp monitor check. I checked his bp and got 160/90, his machine 161/96. He took it yesterday with his machine and showed me and it read 134/82-he just wanted you to know.

## 2019-06-07 LAB — COMPREHENSIVE METABOLIC PANEL
ALT: 74 IU/L — ABNORMAL HIGH (ref 0–44)
AST: 84 IU/L — ABNORMAL HIGH (ref 0–40)
Albumin/Globulin Ratio: 1.4 (ref 1.2–2.2)
Albumin: 4.6 g/dL (ref 3.8–4.8)
Alkaline Phosphatase: 71 IU/L (ref 39–117)
BUN/Creatinine Ratio: 13 (ref 10–24)
BUN: 16 mg/dL (ref 8–27)
Bilirubin Total: 0.6 mg/dL (ref 0.0–1.2)
CO2: 20 mmol/L (ref 20–29)
Calcium: 9.2 mg/dL (ref 8.6–10.2)
Chloride: 106 mmol/L (ref 96–106)
Creatinine, Ser: 1.26 mg/dL (ref 0.76–1.27)
GFR calc Af Amer: 68 mL/min/{1.73_m2} (ref >59)
GFR calc non Af Amer: 59 mL/min/{1.73_m2} — ABNORMAL LOW (ref >59)
Globulin, Total: 3.2 g/dL (ref 1.5–4.5)
Glucose: 110 mg/dL — ABNORMAL HIGH (ref 65–99)
Potassium: 4.3 mmol/L (ref 3.5–5.2)
Sodium: 142 mmol/L (ref 134–144)
Total Protein: 7.8 g/dL (ref 6.0–8.5)

## 2019-06-12 ENCOUNTER — Other Ambulatory Visit: Payer: Self-pay | Admitting: *Deleted

## 2019-06-12 DIAGNOSIS — Z5181 Encounter for therapeutic drug level monitoring: Secondary | ICD-10-CM

## 2019-06-12 DIAGNOSIS — N289 Disorder of kidney and ureter, unspecified: Secondary | ICD-10-CM

## 2019-06-12 DIAGNOSIS — R7989 Other specified abnormal findings of blood chemistry: Secondary | ICD-10-CM

## 2019-06-14 IMAGING — RF DG HIP (WITH PELVIS) OPERATIVE*L*
1 series · 2 of 2 positions shown · non-contrast
Comparison: None.

CLINICAL DATA: 68-year-old male for left hip replacement. Initial
encounter.

EXAM:
OPERATIVE left HIP (WITH PELVIS IF PERFORMED) 2 VIEWS
Fluoroscopic time: 17 seconds.
TECHNIQUE: Fluoroscopic spot image(s) were submitted for interpretation
post-operatively.

[Series 1: run · 2 of 2 slices shown]
[im 1/2]
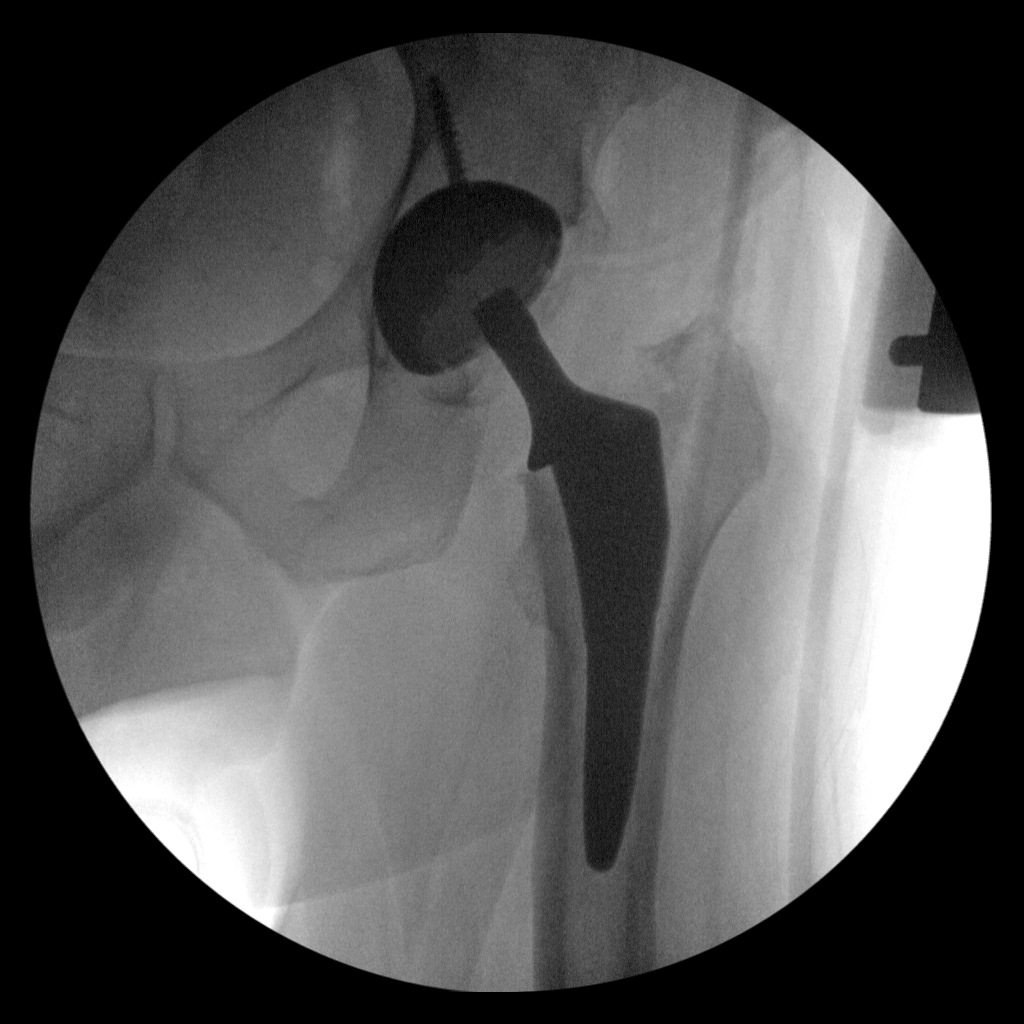
[im 2/2]
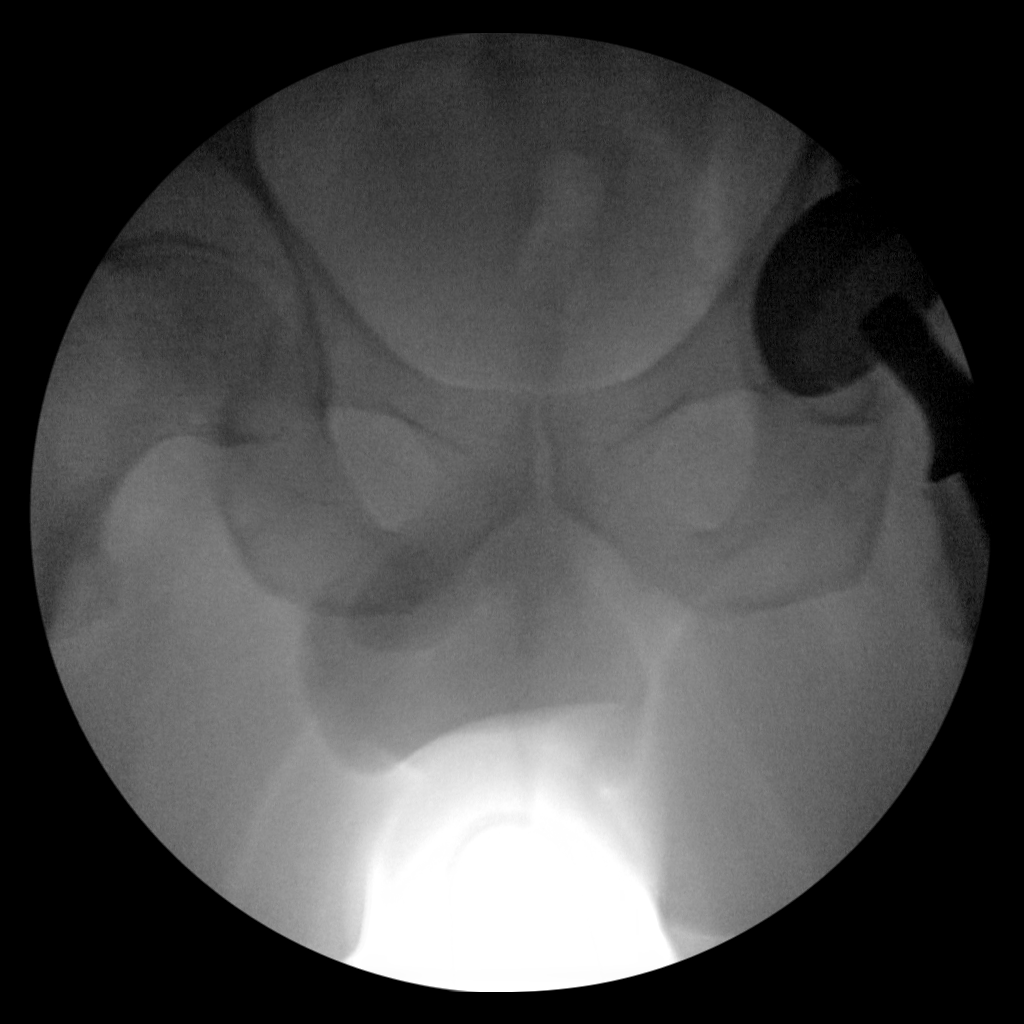

[2 of 2 positions shown; findings below may reference images not displayed]

FINDINGS: Partial placement of left total hip. No complication noted on
frontal projection.
IMPRESSION: Post left hip replacement as noted above.

## 2019-07-17 ENCOUNTER — Encounter: Payer: Self-pay | Admitting: Internal Medicine

## 2019-07-17 ENCOUNTER — Ambulatory Visit (INDEPENDENT_AMBULATORY_CARE_PROVIDER_SITE_OTHER): Payer: Medicare PPO | Admitting: Internal Medicine

## 2019-07-17 VITALS — BP 185/100 | HR 81 | Temp 98.3°F | Ht 70.5 in | Wt 237.0 lb

## 2019-07-17 DIAGNOSIS — I480 Paroxysmal atrial fibrillation: Secondary | ICD-10-CM

## 2019-07-17 DIAGNOSIS — I10 Essential (primary) hypertension: Secondary | ICD-10-CM

## 2019-07-17 NOTE — Progress Notes (Signed)
HPI Timothy Estes returns today for followup. He is a pleasant 69 yo man with a h/o HTN, PAF, and CAD. He has undergone hip replacement without difficulty. He admits to being more sedentary during [place]. He has not had chest pain. No edema. No sob. He checks his bp at home and it runs in the 120's on meedical therapy. Today he was in a hurry and running late and was a bit flustered when he arrived. He notes his SBP was 123 last night. No Known Allergies   Current Outpatient Medications  Medication Sig Dispense Refill  . allopurinol (ZYLOPRIM) 300 MG tablet Take 1 tablet (300 mg total) by mouth daily. 90 tablet 3  . aspirin 81 MG EC tablet Take 81 mg by mouth daily. Swallow whole.    . cholecalciferol (VITAMIN D) 1000 units tablet Take 1,000 Units by mouth daily.    . Coenzyme Q-10 100 MG capsule Take 1 tablet by mouth daily.     . fish oil-omega-3 fatty acids 1000 MG capsule Take 2 g by mouth 2 (two) times daily.     . metoprolol tartrate (LOPRESSOR) 25 MG tablet TAKE 1 TABLET BY MOUTH TWICE A DAY 180 tablet 1  . niacin (NIASPAN) 1000 MG CR tablet TAKE 1 TABLET (1,000 MG TOTAL) BY MOUTH AT BEDTIME. 30 tablet 5  . nitroGLYCERIN (NITROSTAT) 0.4 MG SL tablet Place 1 tablet (0.4 mg total) every 5 (five) minutes as needed under the tongue for chest pain. 28 tablet 1  . rosuvastatin (CRESTOR) 40 MG tablet Take 1 tablet (40 mg total) by mouth daily. 90 tablet 1  . XARELTO 20 MG TABS tablet TAKE 1 TABLET (20 MG TOTAL) BY MOUTH DAILY WITH SUPPER. 90 tablet 3   No current facility-administered medications for this visit.     Past Medical History:  Diagnosis Date  . Anemia   . Arthritis   . Atrial fibrillation with RVR ([place])    converted to NSR with IV dilt 10/12 - no coumadin due to need for Plavix and ASA  . Cataract   . Clotting disorder ([place])    PE 2012  . Colon polyps  10/2015   (non-adenomatous)  . Coronary artery disease    LHC 03/17/11: Dx 30%, OM1 30%, OM2 30%, pRCA 30%, EF  40-45% with anterolat and apical HK  . Diverticulosis  10/2015   noted on colonoscopy  . Elevated LFTs   . Fatty liver   . Hematochezia    LIKEY FROM HEMORRHOIDAL BLEEDING; PT HAS H/O  . Hyperlipidemia   . Hypertension   . Internal hemorrhoids  10/2015   noted on colonoscopy  . NSTEMI (non-ST elevated myocardial infarction) ([place])    2012-FLET THAT NON-ST-ELEVATION MI IS POSSIBLY SECONDARY TO CORONARY EMBOLUS FROM HIS AFIB  . Syncope    in setting of AFib with RVR    ROS:   All systems reviewed and negative except as noted in the HPI.   Past Surgical History:  Procedure Laterality Date  . CARDIAC CATHETERIZATION  03/18/11  . cardiac event monitor  04/01/11  . CATARACT EXTRACTION W/PHACO Left 09/18/2014   Procedure: CATARACT EXTRACTION PHACO AND INTRAOCULAR LENS PLACEMENT LEFT EYE;  Surgeon: Tonny Branch, MD;  Location: [facility];  Service: Ophthalmology;  Laterality: Left;  CDE 13.32  . COLONOSCOPY    . ESOPHAGOGASTRODUODENOSCOPY ENDOSCOPY    . MOUTH SURGERY    . TOTAL HIP ARTHROPLASTY Left 07/10/2018   Procedure: TOTAL HIP ARTHROPLASTY ANTERIOR APPROACH;  Surgeon: Paralee Cancel, MD;  Location: [facility];  Service: Orthopedics;  Laterality: Left;  70 mins  . TRANSTHORACIC ECHOCARDIOGRAM  03/19/11  . WISDOM TOOTH EXTRACTION       Family History  Problem Relation Age of Onset  . Hyperlipidemia Brother   . Hypertension Brother   . Arthritis Mother   . Heart disease Neg Hx   . Cancer Neg Hx   . Diabetes Neg Hx   . Colon cancer Neg Hx   . Esophageal cancer Neg Hx   . Rectal cancer Neg Hx   . Stomach cancer Neg Hx      Social History   Socioeconomic History  . Marital status: Married    Spouse name: Not on file  . Number of children: 0  . Years of education: Not on file  . Highest education level: Not on file  Occupational History  . Occupation: [employer] job 02/2013    Employer: [employer]   Tobacco Use  . Smoking status: Former Smoker    Packs/day:  0.50    Years: 3.00    Pack years: 1.50    Types: Cigarettes    Quit date: 11/24/1974    Years since quitting: 44.6  . Smokeless tobacco: Never Used  Substance and Sexual Activity  . Alcohol use: Not Currently    Alcohol/week: 0.0 standard drinks    Comment: rare beer  . Drug use: No  . Sexual activity: Yes    Partners: Female  Other Topics Concern  . Not on file  Social History Narrative   Lives with wife.  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  Nothing during COVID.      Updated 04/2019   Social Determinants of Health   Financial Resource Strain:   . Difficulty of Paying Living Expenses: Not on file  Food Insecurity:   . Worried About Charity fundraiser in the Last Year: Not on file  . Ran Out of Food in the Last Year: Not on file  Transportation Needs:   . Lack of Transportation (Medical): Not on file  . Lack of Transportation (Non-Medical): Not on file  Physical Activity:   . Days of Exercise per Week: Not on file  . Minutes of Exercise per Session: Not on file  Stress:   . Feeling of Stress : Not on file  Social Connections:   . Frequency of Communication with Friends and Family: Not on file  . Frequency of Social Gatherings with Friends and Family: Not on file  . Attends Religious Services: Not on file  . Active Member of Clubs or Organizations: Not on file  . Attends Archivist Meetings: Not on file  . Marital Status: Not on file  Intimate Partner Violence:   . Fear of Current or Ex-Partner: Not on file  . Emotionally Abused: Not on file  . Physically Abused: Not on file  . Sexually Abused: Not on file     BP (!) 185/100   Pulse 81   Temp 98.3 F (36.8 C)   Ht 5' 10.5" (1.791 m)   Wt 237 lb (107.5 kg)   BMI 33.53 kg/m   Physical Exam:  Well appearing NAD HEENT: Unremarkable Neck:  No JVD, no thyromegally Lymphatics:  No adenopathy Back:  No CVA tenderness Lungs:  Clear with no wheezes HEART:   Regular rate rhythm, no murmurs, no rubs, no clicks Abd:  soft, positive bowel sounds,  no organomegally, no rebound, no guarding Ext:  2 plus pulses, no edema, no cyanosis, no clubbing Skin:  No rashes no nodules Neuro:  CN II through XII intact, motor grossly intact  Assess/Plan: 1. PAF - he has been asymptomatic since his last visit. He will continue Xarelto. 2. CAD - he has been more sedentary. He is encouraged to increase his physical activity. 3. HTN - his bp is high today but he was in a hurry coming in and he notes that when he checks his bp at home it runs in the 120-130 range.  Timothy Estes.D.

## 2019-07-17 NOTE — Patient Instructions (Signed)
Medication Instructions:  Your physician recommends that you continue on your current medications as directed. Please refer to the Current Medication list given to you today.  *If you need a refill on your cardiac medications before your next appointment, please call your pharmacy*  Lab Work: NONE  If you have labs (blood work) drawn today and your tests are completely normal, you will receive your results only by: . MyChart Message (if you have MyChart) OR . A paper copy in the mail If you have any lab test that is abnormal or we need to change your treatment, we will call you to review the results.  Testing/Procedures: NONE   Follow-Up: At [facility], you and your health needs are our priority.  As part of our continuing mission to provide you with exceptional heart care, we have created designated Provider Care Teams.  These Care Teams include your primary Cardiologist (physician) and Advanced Practice Providers (APPs -  Physician Assistants and Nurse Practitioners) who all work together to provide you with the care you need, when you need it.  Your next appointment:   1 year(s)  The format for your next appointment:   In Person  Provider:   Gregg Taylor, MD  Other Instructions Thank you for choosing [facility]!    

## 2019-07-18 ENCOUNTER — Other Ambulatory Visit: Payer: Self-pay | Admitting: Family Medicine

## 2019-07-18 DIAGNOSIS — I2510 Atherosclerotic heart disease of native coronary artery without angina pectoris: Secondary | ICD-10-CM

## 2019-07-18 DIAGNOSIS — I10 Essential (primary) hypertension: Secondary | ICD-10-CM

## 2019-08-14 ENCOUNTER — Other Ambulatory Visit: Payer: Self-pay

## 2019-08-14 ENCOUNTER — Other Ambulatory Visit: Payer: Medicare PPO

## 2019-08-14 DIAGNOSIS — R7989 Other specified abnormal findings of blood chemistry: Secondary | ICD-10-CM

## 2019-08-14 DIAGNOSIS — N289 Disorder of kidney and ureter, unspecified: Secondary | ICD-10-CM

## 2019-08-14 DIAGNOSIS — Z5181 Encounter for therapeutic drug level monitoring: Secondary | ICD-10-CM

## 2019-08-14 LAB — COMPREHENSIVE METABOLIC PANEL
ALT: 75 IU/L — ABNORMAL HIGH (ref 0–44)
AST: 78 IU/L — ABNORMAL HIGH (ref 0–40)
Albumin/Globulin Ratio: 1.5 (ref 1.2–2.2)
Albumin: 4.6 g/dL (ref 3.8–4.8)
Alkaline Phosphatase: 66 IU/L (ref 39–117)
BUN/Creatinine Ratio: 13 (ref 10–24)
BUN: 16 mg/dL (ref 8–27)
Bilirubin Total: 0.8 mg/dL (ref 0.0–1.2)
CO2: 20 mmol/L (ref 20–29)
Calcium: 10 mg/dL (ref 8.6–10.2)
Chloride: 106 mmol/L (ref 96–106)
Creatinine, Ser: 1.21 mg/dL (ref 0.76–1.27)
GFR calc Af Amer: 71 mL/min/{1.73_m2} (ref >59)
GFR calc non Af Amer: 61 mL/min/{1.73_m2} (ref >59)
Globulin, Total: 3.1 g/dL (ref 1.5–4.5)
Glucose: 104 mg/dL — ABNORMAL HIGH (ref 65–99)
Potassium: 4.4 mmol/L (ref 3.5–5.2)
Sodium: 141 mmol/L (ref 134–144)
Total Protein: 7.7 g/dL (ref 6.0–8.5)

## 2019-10-10 ENCOUNTER — Other Ambulatory Visit: Payer: Self-pay | Admitting: Family Medicine

## 2019-10-10 DIAGNOSIS — I2510 Atherosclerotic heart disease of native coronary artery without angina pectoris: Secondary | ICD-10-CM

## 2019-10-10 DIAGNOSIS — I10 Essential (primary) hypertension: Secondary | ICD-10-CM

## 2019-10-23 ENCOUNTER — Other Ambulatory Visit: Payer: Self-pay | Admitting: Family Medicine

## 2019-10-23 ENCOUNTER — Telehealth: Payer: Self-pay | Admitting: *Deleted

## 2019-10-23 DIAGNOSIS — N289 Disorder of kidney and ureter, unspecified: Secondary | ICD-10-CM

## 2019-10-23 DIAGNOSIS — M109 Gout, unspecified: Secondary | ICD-10-CM

## 2019-10-23 DIAGNOSIS — E782 Mixed hyperlipidemia: Secondary | ICD-10-CM

## 2019-10-23 DIAGNOSIS — Z7901 Long term (current) use of anticoagulants: Secondary | ICD-10-CM

## 2019-10-23 DIAGNOSIS — I10 Essential (primary) hypertension: Secondary | ICD-10-CM

## 2019-10-23 DIAGNOSIS — Z125 Encounter for screening for malignant neoplasm of prostate: Secondary | ICD-10-CM

## 2019-10-23 DIAGNOSIS — Z5181 Encounter for therapeutic drug level monitoring: Secondary | ICD-10-CM

## 2019-10-23 DIAGNOSIS — R7301 Impaired fasting glucose: Secondary | ICD-10-CM

## 2019-10-23 NOTE — Telephone Encounter (Signed)
Patient scheduled for med check on 11/07/19 in the afternoon, he wanted to know if he needed labs prior. Thanks.

## 2019-10-31 ENCOUNTER — Other Ambulatory Visit: Payer: Self-pay | Admitting: Family Medicine

## 2019-10-31 DIAGNOSIS — E782 Mixed hyperlipidemia: Secondary | ICD-10-CM

## 2019-10-31 DIAGNOSIS — I2510 Atherosclerotic heart disease of native coronary artery without angina pectoris: Secondary | ICD-10-CM

## 2019-11-03 ENCOUNTER — Other Ambulatory Visit: Payer: Self-pay | Admitting: Internal Medicine

## 2019-11-03 DIAGNOSIS — I480 Paroxysmal atrial fibrillation: Secondary | ICD-10-CM

## 2019-11-04 ENCOUNTER — Other Ambulatory Visit: Payer: Medicare PPO

## 2019-11-04 ENCOUNTER — Encounter: Payer: Self-pay | Admitting: *Deleted

## 2019-11-04 ENCOUNTER — Other Ambulatory Visit: Payer: Self-pay

## 2019-11-04 DIAGNOSIS — Z7901 Long term (current) use of anticoagulants: Secondary | ICD-10-CM

## 2019-11-04 DIAGNOSIS — I10 Essential (primary) hypertension: Secondary | ICD-10-CM

## 2019-11-04 DIAGNOSIS — Z125 Encounter for screening for malignant neoplasm of prostate: Secondary | ICD-10-CM

## 2019-11-04 DIAGNOSIS — Z5181 Encounter for therapeutic drug level monitoring: Secondary | ICD-10-CM

## 2019-11-04 DIAGNOSIS — E782 Mixed hyperlipidemia: Secondary | ICD-10-CM

## 2019-11-04 DIAGNOSIS — R7301 Impaired fasting glucose: Secondary | ICD-10-CM

## 2019-11-04 DIAGNOSIS — M109 Gout, unspecified: Secondary | ICD-10-CM

## 2019-11-04 DIAGNOSIS — N289 Disorder of kidney and ureter, unspecified: Secondary | ICD-10-CM

## 2019-11-04 NOTE — Telephone Encounter (Signed)
Xarelto 20mg  refill request received. Pt is 69 years old, weight-107.5kg, Crea-1.21 on 08/14/2019, last seen by Dr. Lovena Le on 07/17/2019, Diagnosis-Afib, [place].29ml/min; Dose is appropriate based on dosing criteria. Will send in refill to requested pharmacy.

## 2019-11-05 LAB — COMPREHENSIVE METABOLIC PANEL
ALT: 52 IU/L — ABNORMAL HIGH (ref 0–44)
AST: 53 IU/L — ABNORMAL HIGH (ref 0–40)
Albumin/Globulin Ratio: 1.5 (ref 1.2–2.2)
Albumin: 4.4 g/dL (ref 3.8–4.8)
Alkaline Phosphatase: 63 IU/L (ref 48–121)
BUN/Creatinine Ratio: 20 (ref 10–24)
BUN: 17 mg/dL (ref 8–27)
Bilirubin Total: 0.5 mg/dL (ref 0.0–1.2)
CO2: 21 mmol/L (ref 20–29)
Calcium: 9.3 mg/dL (ref 8.6–10.2)
Chloride: 106 mmol/L (ref 96–106)
Creatinine, Ser: 0.86 mg/dL (ref 0.76–1.27)
GFR calc Af Amer: 103 mL/min/{1.73_m2} (ref >59)
GFR calc non Af Amer: 89 mL/min/{1.73_m2} (ref >59)
Globulin, Total: 2.9 g/dL (ref 1.5–4.5)
Glucose: 93 mg/dL (ref 65–99)
Potassium: 4.3 mmol/L (ref 3.5–5.2)
Sodium: 142 mmol/L (ref 134–144)
Total Protein: 7.3 g/dL (ref 6.0–8.5)

## 2019-11-05 LAB — CBC WITH DIFFERENTIAL/PLATELET
Basophils Absolute: 0.1 10*3/uL (ref 0.0–0.2)
Basos: 1 %
EOS (ABSOLUTE): 0.4 10*3/uL (ref 0.0–0.4)
Eos: 5 %
Hematocrit: 44.4 % (ref 37.5–51.0)
Hemoglobin: 15 g/dL (ref 13.0–17.7)
Immature Grans (Abs): 0 10*3/uL (ref 0.0–0.1)
Immature Granulocytes: 0 %
Lymphocytes Absolute: 2.8 10*3/uL (ref 0.7–3.1)
Lymphs: 35 %
MCH: 30.4 pg (ref 26.6–33.0)
MCHC: 33.8 g/dL (ref 31.5–35.7)
MCV: 90 fL (ref 79–97)
Monocytes Absolute: 0.8 10*3/uL (ref 0.1–0.9)
Monocytes: 10 %
Neutrophils Absolute: 3.8 10*3/uL (ref 1.4–7.0)
Neutrophils: 49 %
Platelets: 165 10*3/uL (ref 150–450)
RBC: 4.93 x10E6/uL (ref 4.14–5.80)
RDW: 13.2 % (ref 11.6–15.4)
WBC: 7.9 10*3/uL (ref 3.4–10.8)

## 2019-11-05 LAB — URIC ACID: Uric Acid: 3.8 mg/dL (ref 3.8–8.4)

## 2019-11-05 LAB — LIPID PANEL
Chol/HDL Ratio: 3.7 ratio (ref 0.0–5.0)
Cholesterol, Total: 149 mg/dL (ref 100–199)
HDL: 40 mg/dL (ref >39)
LDL Chol Calc (NIH): 77 mg/dL (ref 0–99)
Triglycerides: 188 mg/dL — ABNORMAL HIGH (ref 0–149)
VLDL Cholesterol Cal: 32 mg/dL (ref 5–40)

## 2019-11-05 LAB — HEMOGLOBIN A1C
Est. average glucose Bld gHb Est-mCnc: 103 mg/dL
Hgb A1c MFr Bld: 5.2 % (ref 4.8–5.6)

## 2019-11-05 LAB — PSA: Prostate Specific Ag, Serum: 0.8 ng/mL (ref 0.0–4.0)

## 2019-11-06 NOTE — Progress Notes (Signed)
Chief Complaint  Patient presents with   Hypertension    nonfasting med check, no concerns.     Patient presents for 6 month follow-up on chronic problems. He had labs done prior to visit, see below.   Hypertension follow-up: Blood pressure at home is running 120's up to 13080's. Home monitor was verified as accurate in 05/2019. He is compliant with metoprolol 32m BID and denies side effects. Denies headaches, dizziness, chest pain, edema, dyspnea with exertion.  Hyperlipidemia follow-up: Patient is reportedly following a low-fat, low cholesterol diet. Compliant with medications (crestor, niaspan and fish oil)and denies medication side effects. Medication was changed from atorvastatin to rosuvastatinin the past due to not reaching goal on atorvastatin. His diet hasn't changed--He uses 1% milk, has cereal every morning. He has 2-3 eggs/week, cut back on cheese. Red meat 2x/week. Mostly lots of chicken and t[place](packaged lunchmeat).Not much fried foods.   Impaired fasting glucose--noted 07/2013. He had mildly elevated glucosein the past,with normal A1c's. He continues to follow a lower carb diet than in the past, eating how his diabetic wife eats.   H/oVitamin D deficiency--level was low at 28 at his physical 02/2015.Whentaking 1000 IU daily,f/u level was normal in October 2017 (41). He is compliant with taking a daily D3, but unsure of dose (reports "100").  Paroxysmal atrial fibrillation and coronary artery disease, status post non-ST elevation MI, with preserved left ventricular systolic function:He is on Xarelto and ASA 849mHe has been maintaining sinus rhythmas far as he knows (since 2012 per pt). He denies anysignificant bleedingor bruising. His last visit withDr. TaOrson Gearn 06/2019--BP was elevated, as he had been rushing.  Home BP's were normal, and no changes were made.  History of iron deficiency anemia. EGD done 01/2018 showed H.pylori gastritis on gastric  biopsies, reflux changes on esophageal biopsies. He was treated by GI with Pyleraand iron twice daily. He denies any further problems. He denies abdominal pain, melena or hematochezia. Anemia has since completely resolved.  Gout: Diagnosed in May, 2018when he presented with bilateral knee effusions. He was started on 30081mf allopurinol daily, and f/u uric acid level was normal. He has colchicine to use prn for any flare.He has had flares of gout (at knee) in the past related to increased beer intake. He only rarely drinks alcohol.  Hasn't had a flare in the past year, reports much longer.   PMH, PSH, SH reviewed  Outpatient Encounter Medications as of 11/07/2019  Medication Sig Note   allopurinol (ZYLOPRIM) 300 MG tablet Take 1 tablet (300 mg total) by mouth daily.    aspirin 81 MG EC tablet Take 81 mg by mouth daily. Swallow whole.    cholecalciferol (VITAMIN D) 1000 units tablet Take 1,000 Units by mouth daily. 11/07/2019: Taking daily--unsure of dose, reported "100"--will check bottle at home   Coenzyme Q-10 100 MG capsule Take 1 tablet by mouth daily.     fish oil-omega-3 fatty acids 1000 MG capsule Take 2 g by mouth 2 (two) times daily.     metoprolol tartrate (LOPRESSOR) 25 MG tablet TAKE 1 TABLET BY MOUTH TWICE A DAY    niacin (NIASPAN) 1000 MG CR tablet TAKE 1 TABLET (1,000 MG TOTAL) BY MOUTH AT BEDTIME.    rosuvastatin (CRESTOR) 40 MG tablet Take 1 tablet (40 mg total) by mouth daily.    XARELTO 20 MG TABS tablet TAKE 1 TABLET (20 MG TOTAL) BY MOUTH DAILY WITH SUPPER.    nitroGLYCERIN (NITROSTAT) 0.4 MG SL tablet Place 1 tablet (  0.4 mg total) every 5 (five) minutes as needed under the tongue for chest pain. (Patient not taking: Reported on 11/07/2019)    No facility-administered encounter medications on file as of 11/07/2019.   No Known Allergies  ROS:  No fever, chills, URI symptoms, headaches, dizziness, chest pain, palpitations, shortness of breath. No nausea,  vomiting, bowel changes, urinary complaints. No bleeding, rashes, mood changes. Denies joint pains. Moods are good. Sone bruising on forearms. Recently rescued a puppy.   PHYSICAL EXAM:  BP (!) 170/100    Pulse 72    Temp 98.2 F (36.8 C) (Temporal)    Ht 5' 10.5" (1.791 m)    Wt 231 lb 3.2 oz (104.9 kg)    BMI 32.70 kg/m   154/84 on repeat by MD  Wt Readings from Last 3 Encounters:  07/17/19 237 lb (107.5 kg)  05/09/19 235 lb 6.4 oz (106.8 kg)  11/01/18 219 lb (99.3 kg)   Pleasant, well-appearing elderly male in good spirits HEENT: PERRL, EOMI, conjunctiva and sclera are clear. Wearing mask. He had it off his face when I first entered room, and poor dentition noted. Neck: no lymphadenopathy, mass, bruit Heart: regular rate and rhythm Lungs: clear bilaterally Abdomen: soft, nontender, no organomegaly or mass Back: no spinal or CVA tenderness Extremities: no edema.   Neuro: alert and oriented Psych: normal mood, affect, hygiene and grooming Skin: bruises/purpura on forearms, L>R   Lab Results  Component Value Date   HGBA1C 5.2 11/04/2019   Fasting glu 93    Chemistry      Component Value Date/Time   NA 142 11/04/2019 0820   K 4.3 11/04/2019 0820   CL 106 11/04/2019 0820   CO2 21 11/04/2019 0820   BUN 17 11/04/2019 0820   CREATININE 0.86 11/04/2019 0820   CREATININE 1.05 09/03/2015 0001      Component Value Date/Time   CALCIUM 9.3 11/04/2019 0820   ALKPHOS 63 11/04/2019 0820   AST 53 (H) 11/04/2019 0820   ALT 52 (H) 11/04/2019 0820   BILITOT 0.5 11/04/2019 0820      Lab Results  Component Value Date   CHOL 149 11/04/2019   HDL 40 11/04/2019   LDLCALC 77 11/04/2019   TRIG 188 (H) 11/04/2019   CHOLHDL 3.7 11/04/2019   Lab Results  Component Value Date   WBC 7.9 11/04/2019   HGB 15.0 11/04/2019   HCT 44.4 11/04/2019   MCV 90 11/04/2019   PLT 165 11/04/2019   Lab Results  Component Value Date   LABURIC 3.8 11/04/2019    Lab Results  Component  Value Date   PSA1 0.8 11/04/2019   PSA1 0.7 10/30/2018   PSA1 0.6 10/26/2017   PSA 0.5 09/22/2016   PSA 0.54 09/03/2015   PSA 0.44 07/24/2014   ASSESSMENT/PLAN:  Essential hypertension - very high initially, lower (but still above goal) on recheck.  Normal at home. Continue current regimen. Cut back on lunchmeats/low Na diet reviewed  Impaired fasting glucose - improved. Cont to limit sugar/carbs.  Weight loss encouraged.  Long term current use of anticoagulant  Atrial fibrillation, unspecified type ([place]) - currently in NSR, rate controlled, anticoagulated  Elevated LFTs - improved from last check. Ddx reviewed.  Encouraged wt loss, proper diet, cont alcohol avoidance.    Gout of knee, unspecified cause, unspecified chronicity, unspecified laterality - continue allopurinol; uric acid at goal - Plan: allopurinol (ZYLOPRIM) 300 MG tablet  Mixed hyperlipidemia - TG mildly elevated. diet reviewed. Cont current meds -  Plan: niacin (NIASPAN) 1000 MG CR tablet, rosuvastatin (CRESTOR) 40 MG tablet  Coronary artery disease involving native coronary artery of native heart without angina pectoris - stable; asymptomatic - Plan: rosuvastatin (CRESTOR) 40 MG tablet  Senile purpura ([place]) - due to blood thinners, mild trauma   6 months--CPE/AWV with labs prior. Cbc, c-met, lipids

## 2019-11-07 ENCOUNTER — Encounter: Payer: Self-pay | Admitting: Family Medicine

## 2019-11-07 ENCOUNTER — Other Ambulatory Visit: Payer: Self-pay

## 2019-11-07 ENCOUNTER — Ambulatory Visit: Payer: Medicare PPO | Admitting: Family Medicine

## 2019-11-07 VITALS — BP 154/84 | HR 72 | Temp 98.2°F | Ht 70.5 in | Wt 231.2 lb

## 2019-11-07 DIAGNOSIS — Z5181 Encounter for therapeutic drug level monitoring: Secondary | ICD-10-CM

## 2019-11-07 DIAGNOSIS — R7301 Impaired fasting glucose: Secondary | ICD-10-CM

## 2019-11-07 DIAGNOSIS — M109 Gout, unspecified: Secondary | ICD-10-CM

## 2019-11-07 DIAGNOSIS — Z7901 Long term (current) use of anticoagulants: Secondary | ICD-10-CM

## 2019-11-07 DIAGNOSIS — I10 Essential (primary) hypertension: Secondary | ICD-10-CM

## 2019-11-07 DIAGNOSIS — I2510 Atherosclerotic heart disease of native coronary artery without angina pectoris: Secondary | ICD-10-CM

## 2019-11-07 DIAGNOSIS — I4891 Unspecified atrial fibrillation: Secondary | ICD-10-CM

## 2019-11-07 DIAGNOSIS — R7989 Other specified abnormal findings of blood chemistry: Secondary | ICD-10-CM

## 2019-11-07 DIAGNOSIS — D692 Other nonthrombocytopenic purpura: Secondary | ICD-10-CM

## 2019-11-07 DIAGNOSIS — E782 Mixed hyperlipidemia: Secondary | ICD-10-CM

## 2019-11-07 MED ORDER — NIACIN ER (ANTIHYPERLIPIDEMIC) 1000 MG PO TBCR: EXTENDED_RELEASE_TABLET | ORAL | 5 refills | Status: DC

## 2019-11-07 MED ORDER — ROSUVASTATIN CALCIUM 40 MG PO TABS: 40.0000 mg | ORAL_TABLET | Freq: Every day | ORAL | 1 refills | Status: DC

## 2019-11-07 MED ORDER — ALLOPURINOL 300 MG PO TABS: 300.0000 mg | ORAL_TABLET | Freq: Every day | ORAL | 3 refills | Status: DC

## 2019-11-07 NOTE — Patient Instructions (Signed)
Your blood pressure was very high in the office.  I like the numbers you report from home.  If you are eating a lot of package lunchmeat, that tends to have a lot of sodium. Try and buy the low sodium [place] from the deli counter (not packaged), which will have a little less.    DASH Eating Plan DASH stands for "Dietary Approaches to Stop Hypertension." The DASH eating plan is a healthy eating plan that has been shown to reduce high blood pressure (hypertension). It may also reduce your risk for type 2 diabetes, heart disease, and stroke. The DASH eating plan may also help with weight loss. What are tips for following this plan?  General guidelines  Avoid eating more than 2,300 mg (milligrams) of salt (sodium) a day. If you have hypertension, you may need to reduce your sodium intake to 1,500 mg a day.  Limit alcohol intake to no more than 1 drink a day for nonpregnant women and 2 drinks a day for men. One drink equals 12 oz of beer, 5 oz of wine, or 1 oz of hard liquor.  Work with your health care provider to maintain a healthy body weight or to lose weight. Ask what an ideal weight is for you.  Get at least 30 minutes of exercise that causes your heart to beat faster (aerobic exercise) most days of the week. Activities may include walking, swimming, or biking.  Work with your health care provider or diet and nutrition specialist (dietitian) to adjust your eating plan to your individual calorie needs. Reading food labels   Check food labels for the amount of sodium per serving. Choose foods with less than 5 percent of the Daily Value of sodium. Generally, foods with less than 300 mg of sodium per serving fit into this eating plan.  To find whole grains, look for the word "whole" as the first word in the ingredient list. Shopping  Buy products labeled as "low-sodium" or "no salt added."  Buy fresh foods. Avoid canned foods and premade or frozen meals. Cooking  Avoid adding salt when  cooking. Use salt-free seasonings or herbs instead of table salt or sea salt. Check with your health care provider or pharmacist before using salt substitutes.  Do not fry foods. Cook foods using healthy methods such as baking, boiling, grilling, and broiling instead.  Cook with heart-healthy oils, such as olive, canola, soybean, or sunflower oil. Meal planning  Eat a balanced diet that includes: ? 5 or more servings of fruits and vegetables each day. At each meal, try to fill half of your plate with fruits and vegetables. ? Up to 6-8 servings of whole grains each day. ? Less than 6 oz of lean meat, poultry, or fish each day. A 3-oz serving of meat is about the same size as a deck of cards. One egg equals 1 oz. ? 2 servings of low-fat dairy each day. ? A serving of nuts, seeds, or beans 5 times each week. ? Heart-healthy fats. Healthy fats called Omega-3 fatty acids are found in foods such as flaxseeds and coldwater fish, like sardines, salmon, and mackerel.  Limit how much you eat of the following: ? Canned or prepackaged foods. ? Food that is high in trans fat, such as fried foods. ? Food that is high in saturated fat, such as fatty meat. ? Sweets, desserts, sugary drinks, and other foods with added sugar. ? Full-fat dairy products.  Do not salt foods before eating.  Try to  eat at least 2 vegetarian meals each week.  Eat more home-cooked food and less restaurant, buffet, and fast food.  When eating at a restaurant, ask that your food be prepared with less salt or no salt, if possible. What foods are recommended? The items listed may not be a complete list. Talk with your dietitian about what dietary choices are best for you. Grains Whole-grain or whole-wheat bread. Whole-grain or whole-wheat pasta. Brown rice. [place]. Bulgur. Whole-grain and low-sodium cereals. Pita bread. Low-fat, low-sodium crackers. Whole-wheat flour tortillas. Vegetables Fresh or frozen vegetables  (raw, steamed, roasted, or grilled). Low-sodium or reduced-sodium tomato and vegetable juice. Low-sodium or reduced-sodium tomato sauce and tomato paste. Low-sodium or reduced-sodium canned vegetables. Fruits All fresh, dried, or frozen fruit. Canned fruit in natural juice (without added sugar). Meat and other protein foods Skinless chicken or [place]. Ground chicken or [place]. Pork with fat trimmed off. Fish and seafood. Egg whites. Dried beans, peas, or lentils. Unsalted nuts, nut butters, and seeds. Unsalted canned beans. Lean cuts of beef with fat trimmed off. Low-sodium, lean deli meat. Dairy Low-fat (1%) or fat-free (skim) milk. Fat-free, low-fat, or reduced-fat cheeses. Nonfat, low-sodium ricotta or cottage cheese. Low-fat or nonfat yogurt. Low-fat, low-sodium cheese. Fats and oils Soft margarine without trans fats. Vegetable oil. Low-fat, reduced-fat, or light mayonnaise and salad dressings (reduced-sodium). Canola, safflower, olive, soybean, and sunflower oils. Avocado. Seasoning and other foods Herbs. Spices. Seasoning mixes without salt. Unsalted popcorn and pretzels. Fat-free sweets. What foods are not recommended? The items listed may not be a complete list. Talk with your dietitian about what dietary choices are best for you. Grains Baked goods made with fat, such as croissants, muffins, or some breads. Dry pasta or rice meal packs. Vegetables Creamed or fried vegetables. Vegetables in a cheese sauce. Regular canned vegetables (not low-sodium or reduced-sodium). Regular canned tomato sauce and paste (not low-sodium or reduced-sodium). Regular tomato and vegetable juice (not low-sodium or reduced-sodium). Angie Fava. Olives. Fruits Canned fruit in a light or heavy syrup. Fried fruit. Fruit in cream or butter sauce. Meat and other protein foods Fatty cuts of meat. Ribs. Fried meat. Berniece Salines. Sausage. Bologna and other processed lunch meats. Salami. Fatback. Hotdogs. Bratwurst. Salted nuts  and seeds. Canned beans with added salt. Canned or smoked fish. Whole eggs or egg yolks. Chicken or [place] with skin. Dairy Whole or 2% milk, cream, and half-and-half. Whole or full-fat cream cheese. Whole-fat or sweetened yogurt. Full-fat cheese. Nondairy creamers. Whipped toppings. Processed cheese and cheese spreads. Fats and oils Butter. Stick margarine. Lard. Shortening. Ghee. Bacon fat. Tropical oils, such as coconut, palm kernel, or palm oil. Seasoning and other foods Salted popcorn and pretzels. Onion salt, garlic salt, seasoned salt, table salt, and sea salt. Worcestershire sauce. Tartar sauce. Barbecue sauce. Teriyaki sauce. Soy sauce, including reduced-sodium. Steak sauce. Canned and packaged gravies. Fish sauce. Oyster sauce. Cocktail sauce. Horseradish that you find on the shelf. Ketchup. Mustard. Meat flavorings and tenderizers. Bouillon cubes. Hot sauce and Tabasco sauce. Premade or packaged marinades. Premade or packaged taco seasonings. Relishes. Regular salad dressings. Where to find more information:  National Heart, Lung, and [place]: https://wilson-eaton.com/  American Heart Association: www.heart.org Summary  The DASH eating plan is a healthy eating plan that has been shown to reduce high blood pressure (hypertension). It may also reduce your risk for type 2 diabetes, heart disease, and stroke.  With the DASH eating plan, you should limit salt (sodium) intake to 2,300 mg a day. If you have  hypertension, you may need to reduce your sodium intake to 1,500 mg a day.  When on the DASH eating plan, aim to eat more fresh fruits and vegetables, whole grains, lean proteins, low-fat dairy, and heart-healthy fats.  Work with your health care provider or diet and nutrition specialist (dietitian) to adjust your eating plan to your individual calorie needs. This information is not intended to replace advice given to you by your health care provider. Make sure you discuss any questions  you have with your health care provider. Document Revised: 04/21/2017 Document Reviewed: 05/02/2016 Elsevier Patient Education  2020 [employer].

## 2020-01-02 ENCOUNTER — Other Ambulatory Visit: Payer: Self-pay | Admitting: Family Medicine

## 2020-01-02 DIAGNOSIS — I2510 Atherosclerotic heart disease of native coronary artery without angina pectoris: Secondary | ICD-10-CM

## 2020-01-02 DIAGNOSIS — I10 Essential (primary) hypertension: Secondary | ICD-10-CM

## 2020-03-26 ENCOUNTER — Other Ambulatory Visit: Payer: Self-pay | Admitting: Family Medicine

## 2020-03-26 DIAGNOSIS — I10 Essential (primary) hypertension: Secondary | ICD-10-CM

## 2020-03-26 DIAGNOSIS — I2510 Atherosclerotic heart disease of native coronary artery without angina pectoris: Secondary | ICD-10-CM

## 2020-04-23 ENCOUNTER — Other Ambulatory Visit: Payer: Self-pay | Admitting: Family Medicine

## 2020-04-23 DIAGNOSIS — I2510 Atherosclerotic heart disease of native coronary artery without angina pectoris: Secondary | ICD-10-CM

## 2020-04-23 DIAGNOSIS — E782 Mixed hyperlipidemia: Secondary | ICD-10-CM

## 2020-04-28 ENCOUNTER — Other Ambulatory Visit: Payer: Self-pay | Admitting: Family Medicine

## 2020-04-28 DIAGNOSIS — E782 Mixed hyperlipidemia: Secondary | ICD-10-CM

## 2020-05-18 ENCOUNTER — Ambulatory Visit: Payer: Self-pay | Admitting: Family Medicine

## 2020-06-10 ENCOUNTER — Encounter: Payer: Self-pay | Admitting: Internal Medicine

## 2020-06-10 ENCOUNTER — Telehealth: Payer: Self-pay | Admitting: Internal Medicine

## 2020-06-10 ENCOUNTER — Telehealth (INDEPENDENT_AMBULATORY_CARE_PROVIDER_SITE_OTHER): Payer: Medicare PPO | Admitting: Internal Medicine

## 2020-06-10 ENCOUNTER — Other Ambulatory Visit: Payer: Self-pay

## 2020-06-10 VITALS — BP 124/81 | HR 74 | Ht 71.0 in | Wt 218.0 lb

## 2020-06-10 DIAGNOSIS — I10 Essential (primary) hypertension: Secondary | ICD-10-CM | POA: Diagnosis not present

## 2020-06-10 DIAGNOSIS — I480 Paroxysmal atrial fibrillation: Secondary | ICD-10-CM | POA: Diagnosis not present

## 2020-06-10 NOTE — Telephone Encounter (Signed)
  Patient Consent for Virtual Visit         Timothy Estes has provided verbal consent on 06/10/2020 for a virtual visit (video or telephone).   CONSENT FOR VIRTUAL VISIT FOR:  Timothy Estes  By participating in this virtual visit I agree to the following:  I hereby voluntarily request, consent and authorize [facility] and its employed or contracted physicians, physician assistants, nurse practitioners or other licensed health care professionals (the Practitioner), to provide me with telemedicine health care services (the "Services") as deemed necessary by the treating Practitioner. I acknowledge and consent to receive the Services by the Practitioner via telemedicine. I understand that the telemedicine visit will involve communicating with the Practitioner through live audiovisual communication technology and the disclosure of certain medical information by electronic transmission. I acknowledge that I have been given the opportunity to request an in-person assessment or other available alternative prior to the telemedicine visit and am voluntarily participating in the telemedicine visit.  I understand that I have the right to withhold or withdraw my consent to the use of telemedicine in the course of my care at any time, without affecting my right to future care or treatment, and that the Practitioner or I may terminate the telemedicine visit at any time. I understand that I have the right to inspect all information obtained and/or recorded in the course of the telemedicine visit and may receive copies of available information for a reasonable fee.  I understand that some of the potential risks of receiving the Services via telemedicine include:  Marland Kitchen Delay or interruption in medical evaluation due to technological equipment failure or disruption; . Information transmitted may not be sufficient (e.g. poor resolution of images) to allow for appropriate medical decision making by the Practitioner; and/or   . In rare instances, security protocols could fail, causing a breach of personal health information.  Furthermore, I acknowledge that it is my responsibility to provide information about my medical history, conditions and care that is complete and accurate to the best of my ability. I acknowledge that Practitioner's advice, recommendations, and/or decision may be based on factors not within their control, such as incomplete or inaccurate data provided by me or distortions of diagnostic images or specimens that may result from electronic transmissions. I understand that the practice of medicine is not an exact science and that Practitioner makes no warranties or guarantees regarding treatment outcomes. I acknowledge that a copy of this consent can be made available to me via my patient portal ([place] [facility]), or I can request a printed copy by calling the office of [facility].    I understand that my insurance will be billed for this visit.   I have read or had this consent read to me. . I understand the contents of this consent, which adequately explains the benefits and risks of the Services being provided via telemedicine.  . I have been provided ample opportunity to ask questions regarding this consent and the Services and have had my questions answered to my satisfaction. . I give my informed consent for the services to be provided through the use of telemedicine in my medical care

## 2020-06-10 NOTE — Progress Notes (Signed)
Electrophysiology TeleHealth Note   Due to national recommendations of social distancing due to COVID 19, an audio/video telehealth visit is felt to be most appropriate for this patient at this time.  See MyChart message from today for the patient's consent to telehealth for [facility].   Date:  06/10/2020   ID:  Timothy Estes, DOB Oct 26, 1950, MRN [id number]  Location: patient's home  Provider location: [address], [place]  Evaluation Performed: Follow-up visit  PCP:  Rita Ohara, MD  Cardiologist:  Cristopher Peru, MD  Electrophysiologist:  Dr Lovena Le  Chief Complaint:  "I've been feeling good."  History of Present Illness:    Timothy Estes is a 70 y.o. male who presents via audio/video conferencing for a telehealth visit today. He is a pleasant 70 yo man with a h/o HTN, PAF, and CAD. In the interim, he has not had any symptomatic palpitations. He denies chest pain or sob.   Past Medical History:  Diagnosis Date  . Anemia   . Arthritis   . Atrial fibrillation with RVR ([place])    converted to NSR with IV dilt 10/12 - no coumadin due to need for Plavix and ASA  . Cataract   . Clotting disorder ([place])    PE 2012  . Colon polyps  10/2015   (non-adenomatous)  . Coronary artery disease    LHC 03/17/11: Dx 30%, OM1 30%, OM2 30%, pRCA 30%, EF 40-45% with anterolat and apical HK  . Diverticulosis  10/2015   noted on colonoscopy  . Elevated LFTs   . Fatty liver   . Hematochezia    LIKEY FROM HEMORRHOIDAL BLEEDING; PT HAS H/O  . Hyperlipidemia   . Hypertension   . Internal hemorrhoids  10/2015   noted on colonoscopy  . NSTEMI (non-ST elevated myocardial infarction) ([place])    2012-FLET THAT NON-ST-ELEVATION MI IS POSSIBLY SECONDARY TO CORONARY EMBOLUS FROM HIS AFIB  . Syncope    in setting of AFib with RVR    Past Surgical History:  Procedure Laterality Date  . CARDIAC CATHETERIZATION  03/18/11  . cardiac event monitor  04/01/11  . CATARACT EXTRACTION W/PHACO Left  09/18/2014   Procedure: CATARACT EXTRACTION PHACO AND INTRAOCULAR LENS PLACEMENT LEFT EYE;  Surgeon: Tonny Branch, MD;  Location: [facility];  Service: Ophthalmology;  Laterality: Left;  CDE 13.32  . COLONOSCOPY    . ESOPHAGOGASTRODUODENOSCOPY ENDOSCOPY    . MOUTH SURGERY    . TOTAL HIP ARTHROPLASTY Left 07/10/2018   Procedure: TOTAL HIP ARTHROPLASTY ANTERIOR APPROACH;  Surgeon: Paralee Cancel, MD;  Location: [facility];  Service: Orthopedics;  Laterality: Left;  70 mins  . TRANSTHORACIC ECHOCARDIOGRAM  03/19/11  . WISDOM TOOTH EXTRACTION      Current Outpatient Medications  Medication Sig Dispense Refill  . allopurinol (ZYLOPRIM) 300 MG tablet Take 1 tablet (300 mg total) by mouth daily. 90 tablet 3  . aspirin 81 MG EC tablet Take 81 mg by mouth daily. Swallow whole.    . cholecalciferol (VITAMIN D) 1000 units tablet Take 1,000 Units by mouth daily.    . Coenzyme Q-10 100 MG capsule Take 1 tablet by mouth daily.     . fish oil-omega-3 fatty acids 1000 MG capsule Take 2 g by mouth 2 (two) times daily.     . metoprolol tartrate (LOPRESSOR) 25 MG tablet TAKE 1 TABLET BY MOUTH TWICE A DAY 180 tablet 0  . niacin (NIASPAN) 1000 MG CR tablet TAKE 1 TABLET (1,000 MG TOTAL) BY  MOUTH AT BEDTIME. 90 tablet 0  . nitroGLYCERIN (NITROSTAT) 0.4 MG SL tablet Place 1 tablet (0.4 mg total) every 5 (five) minutes as needed under the tongue for chest pain. 28 tablet 1  . rosuvastatin (CRESTOR) 40 MG tablet TAKE 1 TABLET BY MOUTH EVERY DAY 90 tablet 0  . XARELTO 20 MG TABS tablet TAKE 1 TABLET (20 MG TOTAL) BY MOUTH DAILY WITH SUPPER. 90 tablet 2   No current facility-administered medications for this visit.    Allergies:   Patient has no known allergies.   Social History:  The patient  reports that he quit smoking about 45 years ago. His smoking use included cigarettes. He has a 1.50 pack-year smoking history. He has never used smokeless tobacco. He reports previous alcohol use. He reports that he does not use drugs.    Family History:  The patient's family history includes Arthritis in his mother; Hyperlipidemia in his brother; Hypertension in his brother.   ROS:  Please see the history of present illness.   All other systems are personally reviewed and negative.    Exam:    Vital Signs:  BP 124/81   Pulse 74   Ht 5\' 11"  (1.803 m)   Wt 218 lb (98.9 kg)   BMI 30.40 kg/m     Labs/Other Tests and Data Reviewed:    Recent Labs: 11/04/2019: ALT 52; BUN 17; Creatinine, Ser 0.86; Hemoglobin 15.0; Platelets 165; Potassium 4.3; Sodium 142   Wt Readings from Last 3 Encounters:  06/10/20 218 lb (98.9 kg)  11/07/19 231 lb 3.2 oz (104.9 kg)  07/17/19 237 lb (107.5 kg)     Other studies personally reviewed: Additional studies/ records that were reviewed today include:   Review of the above records today demonstrates:    ASSESSMENT & PLAN:    1.  PAF - he has been maintaining NSR. He will continue his current meds. 2. CAD - he remains active. He has become less sedentary. He denies angina. 3. HTN - his bp is well controlled.  Follow-up:  1 year Next remote: n/a  Current medicines are reviewed at length with the patient today.   The patient does not have concerns regarding his medicines.  The following changes were made today:  none  Labs/ tests ordered today include: none No orders of the defined types were placed in this encounter.    Patient Risk:  after full review of this patients clinical status, I feel that they are at moderate risk at this time.  Today, I have spent 15 minutes with the patient with telehealth technology discussing all of the above.    Signed, Cristopher Peru, MD  06/10/2020 3:44 PM     [facility] [address] [place] [address] [phone] (office) [phone] (fax)

## 2020-06-10 NOTE — Patient Instructions (Signed)
Medication Instructions:  Your physician recommends that you continue on your current medications as directed. Please refer to the Current Medication list given to you today.  *If you need a refill on your cardiac medications before your next appointment, please call your pharmacy*   Lab Work: None Today If you have labs (blood work) drawn today and your tests are completely normal, you will receive your results only by: Marland Kitchen MyChart Message (if you have MyChart) OR . A paper copy in the mail If you have any lab test that is abnormal or we need to change your treatment, we will call you to review the results.   Testing/Procedures: None Today   Follow-Up: At [facility], you and your health needs are our priority.  As part of our continuing mission to provide you with exceptional heart care, we have created designated Provider Care Teams.  These Care Teams include your primary Cardiologist (physician) and Advanced Practice Providers (APPs -  Physician Assistants and Nurse Practitioners) who all work together to provide you with the care you need, when you need it.  We recommend signing up for the patient portal called "MyChart".  Sign up information is provided on this After Visit Summary.  MyChart is used to connect with patients for Virtual Visits (Telemedicine).  Patients are able to view lab/test results, encounter notes, upcoming appointments, etc.  Non-urgent messages can be sent to your provider as well.   To learn more about what you can do with MyChart, go to [email or web address].    Your next appointment:   12 month(s)  The format for your next appointment:   In Person  Provider:   Cristopher Peru, MD   Other Instructions None Today

## 2020-06-18 ENCOUNTER — Other Ambulatory Visit: Payer: Self-pay | Admitting: Family Medicine

## 2020-06-18 DIAGNOSIS — I10 Essential (primary) hypertension: Secondary | ICD-10-CM

## 2020-06-18 DIAGNOSIS — I2510 Atherosclerotic heart disease of native coronary artery without angina pectoris: Secondary | ICD-10-CM

## 2020-07-19 ENCOUNTER — Other Ambulatory Visit: Payer: Self-pay | Admitting: Family Medicine

## 2020-07-19 DIAGNOSIS — I2510 Atherosclerotic heart disease of native coronary artery without angina pectoris: Secondary | ICD-10-CM

## 2020-07-19 DIAGNOSIS — E782 Mixed hyperlipidemia: Secondary | ICD-10-CM

## 2020-07-20 ENCOUNTER — Other Ambulatory Visit: Payer: Self-pay | Admitting: Family Medicine

## 2020-07-20 DIAGNOSIS — E782 Mixed hyperlipidemia: Secondary | ICD-10-CM

## 2020-07-30 ENCOUNTER — Other Ambulatory Visit: Payer: Self-pay | Admitting: Internal Medicine

## 2020-07-30 DIAGNOSIS — I480 Paroxysmal atrial fibrillation: Secondary | ICD-10-CM

## 2020-07-30 NOTE — Telephone Encounter (Signed)
Pt last saw Dr Lovena Le 06/10/20, last labs 11/04/19 Creat 0.86, age 70, weight 98.9kg, CrCl 113.4, based on CrCl pt is on appropriate dosage of Xarelto 20mg  QD for afib.  Will refill rx.

## 2020-08-02 NOTE — Patient Instructions (Addendum)
  HEALTH MAINTENANCE RECOMMENDATIONS:  It is recommended that you get at least 30 minutes of aerobic exercise at least 5 days/week (for weight loss, you may need as much as 60-90 minutes). This can be any activity that gets your heart rate up. This can be divided in 10-15 minute intervals if needed, but try and build up your endurance at least once a week.  Weight bearing exercise is also recommended twice weekly.  Eat a healthy diet with lots of vegetables, fruits and fiber.  "Colorful" foods have a lot of vitamins (ie green vegetables, tomatoes, red peppers, etc).  Limit sweet tea, regular sodas and alcoholic beverages, all of which has a lot of calories and sugar.  Up to 2 alcoholic drinks daily may be beneficial for men (unless trying to lose weight, watch sugars).  Drink a lot of water.  Sunscreen of at least SPF 30 should be used on all sun-exposed parts of the skin when outside between the hours of 10 am and 4 pm (not just when at beach or pool, but even with exercise, golf, tennis, and yard work!)  Use a sunscreen that says "broad spectrum" so it covers both UVA and UVB rays, and make sure to reapply every 1-2 hours.  Remember to change the batteries in your smoke detectors when changing your clock times in the spring and fall.  Carbon monoxide detectors are recommended for your home.  Use your seat belt every time you are in a car, and please drive safely and not be distracted with cell phones and texting while driving.    Mr. Oyster , Thank you for taking time to come for your Medicare Wellness Visit. I appreciate your ongoing commitment to your health goals. Please review the following plan we discussed and let me know if I can assist you in the future.    This is a list of the screening recommended for you and due dates:  Health Maintenance  Topic Date Due  . Flu Shot  12/22/2019  . Tetanus Vaccine  07/10/2022  . Colon Cancer Screening  11/11/2025  . COVID-19 Vaccine  Completed  .   Hepatitis C: One time screening is recommended by Center for Disease Control  (CDC) for  adults born from [birth year] through 1964.   Completed  . Pneumonia vaccines  Completed  . HPV Vaccine  Aged Out    Please get [place] copies of your living will and healthcare power of attorney, so that they can be scanned into your chart.  Your prior Vitamin D level was normal on 1000 IU of Vitamin D3.  You can cut back the dose to 5000 IU just 2-3 times/week, and that should be sufficient (vs taking 1000-2000 IU every day).  Please remember to get yearly high dose flu shot in the Fall. You can get this from our office as a nurse visit, or at a pharmacy.

## 2020-08-02 NOTE — Progress Notes (Signed)
Chief Complaint  Patient presents with   Medicare Wellness    Fasting AWV/CPE. Left knee has been bothering him, Dr.Olin said for him to reach out when he is ready for a knee replacement-he thinks he is about ready.     Timothy Estes is a 70 y.o. male who presents for a complete physical, Medicare annual wellness visit, and follow-up on chronic problems.   Hypertension follow-up: Blood pressure at home is running 120's-130/low 80's. Was 124/82 last night.  Home monitor was verified as accurate in 05/2019, and also has checked and gotten similar readings from [facility].  He is compliant with metoprolol 25mg  BID and denies side effects.Denies headaches, dizziness, chest pain, edema, dyspnea with exertion.  Hyperlipidemia follow-up: Patient is reportedly following a low-fat, low cholesterol diet. Compliant with medications (crestor, niaspan and fish oil)and denies medication side effects.  His diet hasn't changed--He uses 1% milk, has cereal every morning. He has 2-3 eggs/week, cut back on cheese. Red meat 2x/week. Mostly lots of chicken.Not much fried foods.  Lab Results  Component Value Date   CHOL 149 11/04/2019   HDL 40 11/04/2019   LDLCALC 77 11/04/2019   TRIG 188 (H) 11/04/2019   CHOLHDL 3.7 11/04/2019   Impaired fasting glucose--noted 07/2013. He had mildly elevated glucosein the past,with normal A1c's. He continues to try and limit his carbs and sugars. (wife is a diabetic) Lab Results  Component Value Date   HGBA1C 5.2 11/04/2019    H/oVitamin D deficiency--level was low at 28 at his physical 02/2015.Whentaking 1000 IU daily,f/u level was normal in October 2017 (41). A month ago he increased his vitamin D to 5000 IU daily, as that is what his wife takes.   Paroxysmal atrial fibrillation and coronary artery disease, status post non-ST elevation MI, with preserved left ventricular systolic function:He is on Xarelto and ASA 81mg , and is on metoprolol.He has been  maintaining sinus rhythm. He denies anysignificant bleedingor bruising. His last visit withDr. Orson Gear in 05/2020, no changes were made.  History of iron deficiency anemia. EGD done 01/2018 showed H.pylori gastritis on gastric biopsies, reflux changes on esophageal biopsies. He was treated by GI with Pyleraand iron twice daily. He denies any further problems. He denies abdominal pain, melena or hematochezia. Anemia has since completely resolved. Lab Results  Component Value Date   WBC 7.9 11/04/2019   HGB 15.0 11/04/2019   HCT 44.4 11/04/2019   MCV 90 11/04/2019   PLT 165 11/04/2019   Gout: Diagnosed in May, 2018when he presented with bilateral knee effusions. He was started on 300mg  of allopurinol daily, and f/u uric acid level was normal. He has colchicine to use prn for any flare.He has had flares of gout (at knee) in the past related to increased beer intake. He only rarely drinks alcohol.  Hasn't had any flares since being on allopurinol.  Lab Results  Component Value Date   LABURIC 3.8 11/04/2019    Immunization History  Administered Date(s) Administered   Fluad Quad(high Dose 65+) 02/18/2019   Influenza Split 03/15/2012   Influenza, High Dose Seasonal PF 04/03/2017, 03/30/2018   Influenza,inj,Quad PF,6+ Mos 01/23/2013, 01/22/2014, 02/24/2015, 03/10/2016   Influenza,inj,quad, With Preservative 02/20/2017   Moderna Sars-Covid-2 Vaccination 07/04/2019, 08/02/2019, 04/14/2020   Pneumococcal Conjugate-13 09/22/2016   Pneumococcal Polysaccharide-23 10/30/2017   Tdap 07/10/2012   Zoster 07/10/2012   Zoster Recombinat (Shingrix) 12/09/2016, 04/04/2017   Didn't get a flu shot this years, think it is too late, declines. Last colonoscopy:10/2015 Dr. Fuller Plan (benign polyps, repeat  10 years) Last PSA: Lab Results  Component Value Date   PSA1 0.8 11/04/2019   PSA1 0.7 10/30/2018   PSA1 0.6 10/26/2017   PSA 0.5 09/22/2016   PSA 0.54 09/03/2015   PSA 0.44  07/24/2014   Dentist:going regularly Ophtho:yearly Exercise: Walking 45 minutes every day, some elliptical.  No weights. Yardwork.   Other doctors caring for patient include: GI: Dr. Fuller Plan Cardiology: Dr. Lovena Le Dentist:Dr. Lynnette Caffey Ophtho:Dr. Celestia Khat ([facility] in [place] (did cataract surgery at [facility], office in [place]) Oral surgeon: Dr. Luvenia Heller (removed wisdom teeth) Ortho: Dr. Alvan Dame   Depression screen:Negative Fall screen: none (1 stumble in his house, no injury) Functional status screen: some decrease in vision on R, thinks related to cataract; has appt scheduled Mini-Cog screen: Normal  See full questionnaires in Epic  End of Life Discussion: Patient does havea living will and medical power of attorney. (no copies in chart).  PMH, PSH, SH and FH reviewed and updated  Outpatient Encounter Medications as of 08/03/2020  Medication Sig   allopurinol (ZYLOPRIM) 300 MG tablet Take 1 tablet (300 mg total) by mouth daily.   aspirin 81 MG EC tablet Take 81 mg by mouth daily. Swallow whole.   Coenzyme Q-10 100 MG capsule Take 1 tablet by mouth daily.    fish oil-omega-3 fatty acids 1000 MG capsule Take 2 g by mouth 2 (two) times daily.    metoprolol tartrate (LOPRESSOR) 25 MG tablet TAKE 1 TABLET BY MOUTH TWICE A DAY   niacin (NIASPAN) 1000 MG CR tablet TAKE 1 TABLET (1,000 MG TOTAL) BY MOUTH AT BEDTIME.   rosuvastatin (CRESTOR) 40 MG tablet TAKE 1 TABLET BY MOUTH EVERY DAY   VITAMIN D PO Take 5,000 Int'l Units by mouth daily.   XARELTO 20 MG TABS tablet TAKE 1 TABLET (20 MG TOTAL) BY MOUTH DAILY WITH SUPPER.   [DISCONTINUED] cholecalciferol (VITAMIN D) 1000 units tablet Take 1,000 Units by mouth daily.   nitroGLYCERIN (NITROSTAT) 0.4 MG SL tablet Place 1 tablet (0.4 mg total) every 5 (five) minutes as needed under the tongue for chest pain. (Patient not taking: Reported on 08/03/2020)   No facility-administered encounter  medications on file as of 08/03/2020.   No Known Allergies    ROS: The patient denies anorexia, fever, headaches, weight changes, decreased hearing, ear pain, hoarseness, chest pain, palpitations, dizziness, syncope, dyspnea on exertion, cough, swelling, nausea, vomiting, diarrhea, constipation, abdominal pain, melena, hematochezia, indigestion/heartburn, hematuria, incontinence, erectile dysfunction, nocturia, weakened urine stream, dysuria, genital lesions, numbness, tingling, weakness, tremor, suspicious skin lesions, depression, anxiety, abnormal bleeding/bruising (just some bruising on forearms), or enlarged lymph nodes. Denies memory issues. Denies fatigue. +snoring, but refreshed in mornings, no daytime somnolence. L knee pain (considering surgery, plans to see ortho soon) Decreased vision R eye (known cataract).   PHYSICAL EXAM:  BP (!) 148/90    Pulse 64    Ht 5' 10.5" (1.791 m)    Wt 228 lb 9.6 oz (103.7 kg)    BMI 32.34 kg/m    130/70 on repeat by MD  Wt Readings from Last 3 Encounters:  08/03/20 228 lb 9.6 oz (103.7 kg)  06/10/20 218 lb (98.9 kg)  11/07/19 231 lb 3.2 oz (104.9 kg)     General Appearance:  Alert, cooperative, no distress, appears stated age.  Head:  Normocephalic, without obvious abnormality, atraumatic   Eyes:  PERRL, conjunctiva/corneas clear, EOM's intact, fundi not well visualized, cataract noted on R  Ears:  Normal TM's and  external ear canals   Nose:  Not examined, wearing mask due to COVID-19 pandemic   Throat:  Not examined, wearing mask due to COVID-19 pandemic  Neck:  Supple, no lymphadenopathy; thyroid: no enlargement/ tenderness/nodules; no carotid bruit or JVD   Back:  Spine nontender, no curvature, ROM normal, no CVA tenderness   Lungs:  Clear to auscultation bilaterally without wheezes, rales or ronchi; respirations unlabored.   Chest Wall:  No tenderness or deformity   Heart:  Regular rate and rhythm,  S1 and S2 normal, no murmur, rub or gallop   Breast Exam:  No chest wall tenderness, masses or gynecomastia   Abdomen:  Soft, non-tender, nondistended, normoactive bowel sounds,  no masses, no hepatosplenomegaly   Genitalia:  normal external genitalia, circumsized, no lesions. Testicles are normal, no masses, no hernias  Rectal:  Normal sphincter tone, no masses.  Heme negative stool. Prostate is normal, not enlarged, no nodule.  Extremities:  No clubbing, cyanosis or edema.  Pulses:  2+ and symmetric all extremities   Skin:  Skin color, texture, turgor normal, no rashes or lesions.Many scratches and scabs on his arms, scabs at knees. (related to clearing trees and scratches from dog).  No purpura noted  Lymph nodes:  Cervical, supraclavicular, and axillary nodes normal   Neurologic:  Normal strength, sensation; eflexes 2+ and symmetric throughout    Psych: Normal mood, affect, hygiene and grooming    ASSESSMENT/PLAN:  Annual physical exam  Medicare annual wellness visit, subsequent  Long term current use of anticoagulant - Plan: CBC with Differential/Platelet  Atrial fibrillation, unspecified type ([place]) - currently in NSR. Anticoagulated without complications  Gout of knee, unspecified cause, unspecified chronicity, unspecified laterality - cont allopurinol  Mixed hyperlipidemia - Plan: Lipid panel  Coronary artery disease involving native coronary artery of native heart without angina pectoris - stable on current med regimen. Cont BB, ASA  Vitamin D deficiency - prev adequately replaced on lower doses (currently taking 5,000 IU.  Can decrease to 2-3x/wk or change to 1-2K/daily  Essential hypertension - Well controlled. Cont low Na diet. Encouraged weight loss - Plan: Comprehensive metabolic panel  Elevated LFTs - improved from last check. Ddx reviewed.  Encouraged wt loss, proper diet, cont alcohol avoidance.   - Plan:  Comprehensive metabolic panel  Medication monitoring encounter - Plan: Comprehensive metabolic panel, Lipid panel, CBC with Differential/Platelet  Mixed hyperlipidemia - TG mildly elevated on last check. Proper diet reviewed. Cont current meds - Plan: Lipid panel  Class 1 obesity due to excess calories with serious comorbidity and body mass index (BMI) of 32.0 to 32.9 in adult - comorbidities--HTN, HDL, afib, gout, OA. Encouraged wt loss, healthy diet reviewed  Primary osteoarthritis of left knee - pt plans to f/u with ortho. Encouraged wt loss.  Discussed non-weight bearing exercise to cause less knee pain    Discussed PSA screening (risks/benefits), recommended at least 30 minutes of aerobic activity at least 5 days/week, weight-bearing exercise at least 2x/week; proper sunscreen use reviewed; healthy diet and alcohol recommendations (less than or equal to 2 drinks/day) reviewed; regular seatbelt use; changing batteries in smoke detectors. Immunization recommendations discussed--continue yearly flu shots (declined today, due to being late in season--encouraged him to get in the Fall). Colonoscopy recommendations reviewed--UTD.   MOST formreviewed, updated. Full Code, Full Care Encouraged to get [place] copies of living will and healthcare POA paperwork  F/u 6 mos (will need PSA and uric acid drawn along with other labs, too soon to do today)  Vikki Ports, MD  Medicare Attestation I have personally reviewed: The patient's medical and social history Their use of alcohol, tobacco or illicit drugs Their current medications and supplements The patient's functional ability including ADLs,fall risks, home safety risks, cognitive, and hearing and visual impairment Diet and physical activities Evidence for depression or mood disorders  The patient's weight, heightandBMI have been recorded in the chart. I have made referrals, counseling, and provided education to the patient based on  review of the above and I have provided the patient with a written personalized care plan for preventive services.

## 2020-08-03 ENCOUNTER — Encounter: Payer: Self-pay | Admitting: Family Medicine

## 2020-08-03 ENCOUNTER — Ambulatory Visit: Payer: Medicare PPO | Admitting: Family Medicine

## 2020-08-03 ENCOUNTER — Other Ambulatory Visit: Payer: Self-pay

## 2020-08-03 VITALS — BP 130/70 | HR 64 | Ht 70.5 in | Wt 228.6 lb

## 2020-08-03 DIAGNOSIS — R7989 Other specified abnormal findings of blood chemistry: Secondary | ICD-10-CM

## 2020-08-03 DIAGNOSIS — E782 Mixed hyperlipidemia: Secondary | ICD-10-CM | POA: Diagnosis not present

## 2020-08-03 DIAGNOSIS — Z0000 Encounter for general adult medical examination without abnormal findings: Secondary | ICD-10-CM | POA: Diagnosis not present

## 2020-08-03 DIAGNOSIS — M1712 Unilateral primary osteoarthritis, left knee: Secondary | ICD-10-CM

## 2020-08-03 DIAGNOSIS — M109 Gout, unspecified: Secondary | ICD-10-CM

## 2020-08-03 DIAGNOSIS — I4891 Unspecified atrial fibrillation: Secondary | ICD-10-CM | POA: Diagnosis not present

## 2020-08-03 DIAGNOSIS — I10 Essential (primary) hypertension: Secondary | ICD-10-CM

## 2020-08-03 DIAGNOSIS — Z7901 Long term (current) use of anticoagulants: Secondary | ICD-10-CM

## 2020-08-03 DIAGNOSIS — Z6832 Body mass index (BMI) 32.0-32.9, adult: Secondary | ICD-10-CM

## 2020-08-03 DIAGNOSIS — Z5181 Encounter for therapeutic drug level monitoring: Secondary | ICD-10-CM

## 2020-08-03 DIAGNOSIS — E559 Vitamin D deficiency, unspecified: Secondary | ICD-10-CM

## 2020-08-03 DIAGNOSIS — I2510 Atherosclerotic heart disease of native coronary artery without angina pectoris: Secondary | ICD-10-CM

## 2020-08-03 DIAGNOSIS — E6609 Other obesity due to excess calories: Secondary | ICD-10-CM

## 2020-08-03 DIAGNOSIS — D692 Other nonthrombocytopenic purpura: Secondary | ICD-10-CM

## 2020-08-04 LAB — CBC WITH DIFFERENTIAL/PLATELET
Basophils Absolute: 0.1 10*3/uL (ref 0.0–0.2)
Basos: 1 %
EOS (ABSOLUTE): 0.6 10*3/uL — ABNORMAL HIGH (ref 0.0–0.4)
Eos: 7 %
Hematocrit: 44.8 % (ref 37.5–51.0)
Hemoglobin: 15.3 g/dL (ref 13.0–17.7)
Immature Grans (Abs): 0 10*3/uL (ref 0.0–0.1)
Immature Granulocytes: 0 %
Lymphocytes Absolute: 2.6 10*3/uL (ref 0.7–3.1)
Lymphs: 28 %
MCH: 29.7 pg (ref 26.6–33.0)
MCHC: 34.2 g/dL (ref 31.5–35.7)
MCV: 87 fL (ref 79–97)
Monocytes Absolute: 0.9 10*3/uL (ref 0.1–0.9)
Monocytes: 9 %
Neutrophils Absolute: 5.1 10*3/uL (ref 1.4–7.0)
Neutrophils: 55 %
Platelets: 213 10*3/uL (ref 150–450)
RBC: 5.15 x10E6/uL (ref 4.14–5.80)
RDW: 13 % (ref 11.6–15.4)
WBC: 9.2 10*3/uL (ref 3.4–10.8)

## 2020-08-04 LAB — COMPREHENSIVE METABOLIC PANEL
ALT: 62 IU/L — ABNORMAL HIGH (ref 0–44)
AST: 66 IU/L — ABNORMAL HIGH (ref 0–40)
Albumin/Globulin Ratio: 1.4 (ref 1.2–2.2)
Albumin: 4.9 g/dL — ABNORMAL HIGH (ref 3.8–4.8)
Alkaline Phosphatase: 64 IU/L (ref 44–121)
BUN/Creatinine Ratio: 17 (ref 10–24)
BUN: 19 mg/dL (ref 8–27)
Bilirubin Total: 0.5 mg/dL (ref 0.0–1.2)
CO2: 20 mmol/L (ref 20–29)
Calcium: 9.9 mg/dL (ref 8.6–10.2)
Chloride: 104 mmol/L (ref 96–106)
Creatinine, Ser: 1.13 mg/dL (ref 0.76–1.27)
Globulin, Total: 3.5 g/dL (ref 1.5–4.5)
Glucose: 109 mg/dL — ABNORMAL HIGH (ref 65–99)
Potassium: 4.4 mmol/L (ref 3.5–5.2)
Sodium: 142 mmol/L (ref 134–144)
Total Protein: 8.4 g/dL (ref 6.0–8.5)
eGFR: 70 mL/min/{1.73_m2} (ref >59)

## 2020-08-04 LAB — LIPID PANEL
Chol/HDL Ratio: 3.3 ratio (ref 0.0–5.0)
Cholesterol, Total: 158 mg/dL (ref 100–199)
HDL: 48 mg/dL (ref >39)
LDL Chol Calc (NIH): 82 mg/dL (ref 0–99)
Triglycerides: 162 mg/dL — ABNORMAL HIGH (ref 0–149)
VLDL Cholesterol Cal: 28 mg/dL (ref 5–40)

## 2020-09-14 ENCOUNTER — Other Ambulatory Visit: Payer: Self-pay | Admitting: Family Medicine

## 2020-09-14 DIAGNOSIS — I2510 Atherosclerotic heart disease of native coronary artery without angina pectoris: Secondary | ICD-10-CM

## 2020-09-14 DIAGNOSIS — I10 Essential (primary) hypertension: Secondary | ICD-10-CM

## 2020-10-15 ENCOUNTER — Other Ambulatory Visit: Payer: Self-pay | Admitting: Family Medicine

## 2020-10-15 DIAGNOSIS — E782 Mixed hyperlipidemia: Secondary | ICD-10-CM

## 2020-10-15 DIAGNOSIS — I2510 Atherosclerotic heart disease of native coronary artery without angina pectoris: Secondary | ICD-10-CM

## 2020-10-25 ENCOUNTER — Other Ambulatory Visit: Payer: Self-pay | Admitting: Family Medicine

## 2020-10-25 DIAGNOSIS — M109 Gout, unspecified: Secondary | ICD-10-CM

## 2020-12-12 ENCOUNTER — Other Ambulatory Visit: Payer: Self-pay | Admitting: Family Medicine

## 2020-12-12 DIAGNOSIS — I10 Essential (primary) hypertension: Secondary | ICD-10-CM

## 2020-12-12 DIAGNOSIS — I2510 Atherosclerotic heart disease of native coronary artery without angina pectoris: Secondary | ICD-10-CM

## 2021-01-10 ENCOUNTER — Other Ambulatory Visit: Payer: Self-pay | Admitting: Family Medicine

## 2021-01-10 DIAGNOSIS — E782 Mixed hyperlipidemia: Secondary | ICD-10-CM

## 2021-01-10 DIAGNOSIS — I2510 Atherosclerotic heart disease of native coronary artery without angina pectoris: Secondary | ICD-10-CM

## 2021-01-23 ENCOUNTER — Other Ambulatory Visit: Payer: Self-pay | Admitting: Internal Medicine

## 2021-01-23 ENCOUNTER — Other Ambulatory Visit: Payer: Self-pay | Admitting: Family Medicine

## 2021-01-23 DIAGNOSIS — I480 Paroxysmal atrial fibrillation: Secondary | ICD-10-CM

## 2021-01-23 DIAGNOSIS — M109 Gout, unspecified: Secondary | ICD-10-CM

## 2021-01-26 NOTE — Telephone Encounter (Signed)
Prescription refill request for Xarelto received.  Indication: Afib  Last office visit: 06/10/20 Lovena Le)  Weight: 103.7kg Age: 70 Scr:1.13 (08/03/20) CrCl: 90.31m/min   Appropriate dose and refill sent to requested pharmacy.

## 2021-01-28 ENCOUNTER — Other Ambulatory Visit: Payer: Self-pay | Admitting: Family Medicine

## 2021-01-28 DIAGNOSIS — I2510 Atherosclerotic heart disease of native coronary artery without angina pectoris: Secondary | ICD-10-CM

## 2021-01-28 DIAGNOSIS — E782 Mixed hyperlipidemia: Secondary | ICD-10-CM

## 2021-02-02 NOTE — Progress Notes (Signed)
Chief Complaint  Patient presents with   Hypertension    Fasting med check, no concerns.    Patient presents for 6 month follow-up on chronic problems.  Fasting glucose was elevated at 109 in 07/2020. He was encouraged to limit sweets and carbs, get regular exercise and lose weight. He lost some weight and cut back on sweets. He has had some elevations in the past, intermittently, with normal A1c. Lab Results  Component Value Date   HGBA1C 5.2 11/04/2019    Hypertension follow-up:  Blood pressure at home is running 120's/70's.  It was 127/73 this morning. He is compliant with metoprolol '25mg'$  BID and denies side effects. Denies headaches, dizziness, chest pain, edema, dyspnea with exertion. BP Readings from Last 3 Encounters:  02/03/21 128/78  08/03/20 130/70  06/10/20 124/81   Hyperlipidemia follow-up:  Patient is reportedly following a low-fat, low cholesterol diet. Compliant with medications (crestor, niaspan and fish oil) and denies medication side effects.  His diet hasn't changed--He uses 1% milk, has 2-3 eggs/week, cut back on cheese.  Red meat 2x/week.  Mostly lots of chicken, not much fried foods. TG mildly elevated on last check, LDL was >70, a little higher than where he had been running (mid-70's). Lab Results  Component Value Date   CHOL 158 08/03/2020   HDL 48 08/03/2020   LDLCALC 82 08/03/2020   TRIG 162 (H) 08/03/2020   CHOLHDL 3.3 08/03/2020    Paroxysmal atrial fibrillation and coronary artery disease, status post non-ST elevation MI, with preserved left ventricular systolic function: He is on Xarelto and ASA '81mg'$ , and is on metoprolol. He has been maintaining sinus rhythm. He denies any significant bleeding or bruising. His last visit with Dr. Lovena Le was in 05/2020, no changes were made.   H/o Vitamin D deficiency--level was low at 28 at his physical 02/2015. When taking 1000 IU daily, f/u level was normal in October 2017 (41). in 06/2020, he increased his vitamin D to  5000 IU daily, as that is what his wife takes.  He is currently taking 1000 IU daily.  History of iron deficiency anemia.  EGD done 01/2018 showed H.pylori gastritis on gastric biopsies, reflux changes on esophageal biopsies.  He was treated by GI with Pylera and iron twice daily.  He denies any further problems.  He denies abdominal pain, melena or hematochezia. Anemia has since completely resolved. Lab Results  Component Value Date   WBC 9.2 08/03/2020   HGB 15.3 08/03/2020   HCT 44.8 08/03/2020   MCV 87 08/03/2020   PLT 213 08/03/2020     Gout:  Diagnosed in May, 2018 when he presented with bilateral knee effusions. He was started on '300mg'$  of allopurinol daily, and f/u uric acid level was normal. He has colchicine to use prn for any flare. He has had flares of gout (at knee) in the past related to increased beer intake. He no longer drinks alcohol.  Hasn't had any flares since being on allopurinol. Lab Results  Component Value Date   LABURIC 3.8 11/04/2019   Elevated LFT's:  Ultrasound in the past showed fatty liver changes, last done in 2012.  LFT's have been persistently elevated.  He no longer drinks alcohol. Lab Results  Component Value Date   ALT 62 (H) 08/03/2020   AST 66 (H) 08/03/2020   ALKPHOS 64 08/03/2020   BILITOT 0.5 08/03/2020    PMH, PSH, SH reviewed  Outpatient Encounter Medications as of 02/03/2021  Medication Sig   allopurinol (ZYLOPRIM) 300  MG tablet TAKE 1 TABLET BY MOUTH EVERY DAY   aspirin 81 MG EC tablet Take 81 mg by mouth daily. Swallow whole.   Coenzyme Q-10 100 MG capsule Take 1 tablet by mouth daily.    fish oil-omega-3 fatty acids 1000 MG capsule Take 2 g by mouth 2 (two) times daily.    metoprolol tartrate (LOPRESSOR) 25 MG tablet TAKE 1 TABLET BY MOUTH TWICE A DAY   rosuvastatin (CRESTOR) 40 MG tablet TAKE 1 TABLET BY MOUTH EVERY DAY   VITAMIN D PO Take 1,000 Int'l Units by mouth daily.   XARELTO 20 MG TABS tablet TAKE 1 TABLET BY MOUTH DAILY WITH  SUPPER.   [DISCONTINUED] niacin (NIASPAN) 1000 MG CR tablet TAKE 1 TABLET (1,000 MG TOTAL) BY MOUTH AT BEDTIME.   niacin (NIASPAN) 1000 MG CR tablet Take 1 tablet (1,000 mg total) by mouth at bedtime.   nitroGLYCERIN (NITROSTAT) 0.4 MG SL tablet Place 1 tablet (0.4 mg total) under the tongue every 5 (five) minutes as needed for chest pain.   [DISCONTINUED] nitroGLYCERIN (NITROSTAT) 0.4 MG SL tablet Place 1 tablet (0.4 mg total) every 5 (five) minutes as needed under the tongue for chest pain. (Patient not taking: No sig reported)   No facility-administered encounter medications on file as of 02/03/2021.   No Known Allergies   ROS:  No fever, chills, URI symptoms, headaches, dizziness, chest pain, palpitations, shortness of breath. No nausea, vomiting, bowel changes, urinary complaints. No bleeding, rashes, mood changes. Denies joint pains. Moods are good. Some bruising on forearms.  He has slight runny nose, thinks related to being outside a lot   PHYSICAL EXAM:  BP 128/78   Pulse 72   Ht 5' 10.5" (1.791 m)   Wt 222 lb 9.6 oz (101 kg)   BMI 31.49 kg/m  Wt Readings from Last 3 Encounters:  02/03/21 222 lb 9.6 oz (101 kg)  08/03/20 228 lb 9.6 oz (103.7 kg)  06/10/20 218 lb (98.9 kg)   Pleasant, well-appearing elderly male in good spirits. Some slight sniffling during visit. HEENT: PERRL, EOMI, conjunctiva and sclera are clear. Wearing mask. Neck: no lymphadenopathy, thyromegaly, mass or bruit Heart: regular rate and rhythm Lungs: clear bilaterally Abdomen: soft, nontender, no organomegaly or mass Back: no spinal or CVA tenderness Extremities: no edema.   Neuro: alert and oriented Psych: normal mood, affect, hygiene and grooming Skin: bruises/purpura on forearms, L>R  Lab Results  Component Value Date   HGBA1C 5.3 02/03/2021     ASSESSMENT/PLAN:  Mixed hyperlipidemia - TG mildly elevated on last check. diet reviewed. LDL has been above goal of <70. Will reach out to  cardiologist (?lipid clinic, vs add zetia) if still above goa - Plan: niacin (NIASPAN) 1000 MG CR tablet, Lipid panel  Elevated LFTs - Due for recheck.  Fatty liver per prior US.  Discussed this. Cont to avoid alcohol - Plan: Hepatic function panel, FIB-4 w/Reflex NASH FibroSure  Fatty liver disease, nonalcoholic - has been 10 years since [place], with persistent elevation in LFT's. Check FibroSure, consider repeat imaging - Plan: FIB-4 w/Reflex NASH FibroSure  Coronary artery disease involving native coronary artery of native heart without angina pectoris - Plan: nitroGLYCERIN (NITROSTAT) 0.4 MG SL tablet  Essential hypertension - well controlled  Atrial fibrillation, unspecified type ([facility]) - in sinus rhythm.  Doing well.  Sees Dr. Lovena Le yearly in January  Long term current use of anticoagulant - Plan: CBC with Differential/Platelet  Impaired fasting glucose - counseled re: diet, exercise,  weight loss - Plan: HgB A1c, Glucose, random  Medication monitoring encounter - Plan: Hepatic function panel, CBC with Differential/Platelet, Uric acid, Lipid panel  Coronary artery disease involving native coronary artery of native heart without angina pectoris - stable; asymptomatic. Refilled NTG for prn use - Plan: nitroGLYCERIN (NITROSTAT) 0.4 MG SL tablet  Gout of knee, unspecified cause, unspecified chronicity, unspecified laterality - no flares since on allopurinol and no longer drinking alcohol - Plan: Uric acid  Need for influenza vaccination - Plan: Flu Vaccine QUAD High Dose(Fluad)   High dose flu shot given. COVID booster recommended, discussed  Need to re-address lipids, if still not at goal--add zetia vs lipid clinic. Will forward results to cardiologist for his input if still above goal.   F/u as scheduled 07/2021

## 2021-02-03 ENCOUNTER — Other Ambulatory Visit: Payer: Self-pay

## 2021-02-03 ENCOUNTER — Ambulatory Visit: Payer: Medicare PPO | Admitting: Family Medicine

## 2021-02-03 ENCOUNTER — Encounter: Payer: Self-pay | Admitting: Family Medicine

## 2021-02-03 VITALS — BP 128/78 | HR 72 | Ht 70.5 in | Wt 222.6 lb

## 2021-02-03 DIAGNOSIS — Z23 Encounter for immunization: Secondary | ICD-10-CM | POA: Diagnosis not present

## 2021-02-03 DIAGNOSIS — K760 Fatty (change of) liver, not elsewhere classified: Secondary | ICD-10-CM

## 2021-02-03 DIAGNOSIS — R7301 Impaired fasting glucose: Secondary | ICD-10-CM

## 2021-02-03 DIAGNOSIS — R7989 Other specified abnormal findings of blood chemistry: Secondary | ICD-10-CM

## 2021-02-03 DIAGNOSIS — M109 Gout, unspecified: Secondary | ICD-10-CM

## 2021-02-03 DIAGNOSIS — Z7901 Long term (current) use of anticoagulants: Secondary | ICD-10-CM | POA: Diagnosis not present

## 2021-02-03 DIAGNOSIS — Z5181 Encounter for therapeutic drug level monitoring: Secondary | ICD-10-CM | POA: Diagnosis not present

## 2021-02-03 DIAGNOSIS — I2510 Atherosclerotic heart disease of native coronary artery without angina pectoris: Secondary | ICD-10-CM | POA: Diagnosis not present

## 2021-02-03 DIAGNOSIS — I4891 Unspecified atrial fibrillation: Secondary | ICD-10-CM | POA: Diagnosis not present

## 2021-02-03 DIAGNOSIS — I10 Essential (primary) hypertension: Secondary | ICD-10-CM

## 2021-02-03 DIAGNOSIS — E782 Mixed hyperlipidemia: Secondary | ICD-10-CM | POA: Diagnosis not present

## 2021-02-03 LAB — POCT GLYCOSYLATED HEMOGLOBIN (HGB A1C): Hemoglobin A1C: 5.3 % (ref 4.0–5.6)

## 2021-02-03 MED ORDER — NITROGLYCERIN 0.4 MG SL SUBL: 0.4000 mg | SUBLINGUAL_TABLET | SUBLINGUAL | 1 refills | Status: DC | PRN

## 2021-02-03 MED ORDER — NIACIN ER (ANTIHYPERLIPIDEMIC) 1000 MG PO TBCR: 1000.0000 mg | EXTENDED_RELEASE_TABLET | Freq: Every day | ORAL | 1 refills | Status: DC

## 2021-02-03 NOTE — Patient Instructions (Signed)
I recommend getting the new COVID vaccine.  We gave you a flu shot today in the office, so you need to wait 2 weeks before getting the COVID vaccine. If it turns out that your runny nose turns into something more, please do another COVID test at home.  If you test positive for COVID, you can delay this booster by at least a month. You can either get this vaccine at the pharmacies (which have them now) vs in our office (hope to get the vaccine soon).

## 2021-02-08 ENCOUNTER — Other Ambulatory Visit: Payer: Self-pay | Admitting: *Deleted

## 2021-02-08 MED ORDER — EZETIMIBE 10 MG PO TABS: 10.0000 mg | ORAL_TABLET | Freq: Every day | ORAL | 0 refills | Status: DC

## 2021-02-08 MED ORDER — ALLOPURINOL 100 MG PO TABS
100.0000 mg | ORAL_TABLET | Freq: Every day | ORAL | 0 refills | Status: DC
Start: 2021-02-08 — End: 2021-05-07

## 2021-02-08 NOTE — Addendum Note (Signed)
Addended by: Rita Ohara on: 02/08/2021 08:08 AM   Modules accepted: Orders

## 2021-02-08 NOTE — Progress Notes (Signed)
Advise pt--one liver test mildly elevated, overall lower than last value.  Still waiting on final results from the tests for fibrosis.  Sugar was normal at 93.  Blood counts are normal.  Uric acid is very low.  I recommend trial of changing allopurinol to 100mg  daily. I think as long as he doesn't go back to drinking alcohol, this will be enough. Please send in rx for 100mg  1 po qd, #90, d/c the 300mg  dose. Lipids--triglycerides were high. Be sure to continue on fish oil and avoiding fatty/fried foods and sugar.  His LDL was 79.  Goal is <70.  I sent his labs to Dr. Lovena Le who recommended adding zetia 10mg  daily to his regimen.  Please send in rx for #90, no refill. Please set up fasting lab visit for 3 months--I entered orders (lipids, LFT, uric acid). Will be in contact again once the test for liver fibrosis comes back

## 2021-02-11 LAB — HEPATIC FUNCTION PANEL
ALT: 40 IU/L (ref 0–44)
AST: 46 IU/L — ABNORMAL HIGH (ref 0–40)
Albumin: 4.7 g/dL (ref 3.8–4.8)
Alkaline Phosphatase: 64 IU/L (ref 44–121)
Bilirubin Total: 0.9 mg/dL (ref 0.0–1.2)
Bilirubin, Direct: 0.26 mg/dL (ref 0.00–0.40)
Total Protein: 7.8 g/dL (ref 6.0–8.5)

## 2021-02-11 LAB — CBC WITH DIFFERENTIAL/PLATELET
Basophils Absolute: 0.1 10*3/uL (ref 0.0–0.2)
Basos: 1 %
EOS (ABSOLUTE): 0.4 10*3/uL (ref 0.0–0.4)
Eos: 4 %
Hematocrit: 44.5 % (ref 37.5–51.0)
Hemoglobin: 15.4 g/dL (ref 13.0–17.7)
Immature Grans (Abs): 0 10*3/uL (ref 0.0–0.1)
Immature Granulocytes: 0 %
Lymphocytes Absolute: 2.9 10*3/uL (ref 0.7–3.1)
Lymphs: 30 %
MCH: 30.6 pg (ref 26.6–33.0)
MCHC: 34.6 g/dL (ref 31.5–35.7)
MCV: 88 fL (ref 79–97)
Monocytes Absolute: 0.9 10*3/uL (ref 0.1–0.9)
Monocytes: 9 %
Neutrophils Absolute: 5.3 10*3/uL (ref 1.4–7.0)
Neutrophils: 56 %
Platelets: 165 10*3/uL (ref 150–450)
RBC: 5.04 x10E6/uL (ref 4.14–5.80)
RDW: 13.3 % (ref 11.6–15.4)
WBC: 9.5 10*3/uL (ref 3.4–10.8)

## 2021-02-11 LAB — NASH FIBROSURE
ALPHA 2-MACROGLOBULINS, QN: 302 mg/dL — ABNORMAL HIGH (ref 110–276)
ALT (SGPT) P5P: 44 IU/L (ref 0–55)
AST (SGOT) P5P: 53 IU/L — ABNORMAL HIGH (ref 0–40)
Apolipoprotein A-1: 136 mg/dL (ref 101–178)
Bilirubin, Total: 0.7 mg/dL (ref 0.0–1.2)
Cholesterol, Total: 153 mg/dL (ref 100–199)
Fibrosis Score: 0.64 — ABNORMAL HIGH (ref 0.00–0.21)
GGT: 60 IU/L (ref 0–65)
Glucose: 97 mg/dL (ref 65–99)
Haptoglobin: 189 mg/dL (ref 32–363)
Height: 70 in
NASH Score: 0.5 — ABNORMAL HIGH
Steatosis Score: 0.69 — ABNORMAL HIGH (ref 0.00–0.30)
Triglycerides: 228 mg/dL — ABNORMAL HIGH (ref 0–149)
Weight: 222 [lb_av]

## 2021-02-11 LAB — GLUCOSE, RANDOM: Glucose: 93 mg/dL (ref 65–99)

## 2021-02-11 LAB — FIB-4 W/REFLEX NASH FIBROSURE: FIB-4 Index: 3.04 — ABNORMAL HIGH (ref 0.00–2.67)

## 2021-02-11 LAB — URIC ACID: Uric Acid: 3.2 mg/dL — ABNORMAL LOW (ref 3.8–8.4)

## 2021-02-11 LAB — LIPID PANEL
Chol/HDL Ratio: 3.8 ratio (ref 0.0–5.0)
Cholesterol, Total: 158 mg/dL (ref 100–199)
HDL: 42 mg/dL (ref >39)
LDL Chol Calc (NIH): 79 mg/dL (ref 0–99)
Triglycerides: 223 mg/dL — ABNORMAL HIGH (ref 0–149)
VLDL Cholesterol Cal: 37 mg/dL (ref 5–40)

## 2021-02-11 NOTE — Progress Notes (Signed)
Call pt to advise that the last test result came back. 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  This has the potential to progress to cirrhosis, but he is NOT there yet. It is very important to lose weight and get the triglycerides down, which contributes to fatty liver changes.  I think we can hold off on any imaging at this time, but may consider a referral to GI in the future for further management.

## 2021-03-12 ENCOUNTER — Other Ambulatory Visit: Payer: Self-pay | Admitting: Family Medicine

## 2021-03-12 DIAGNOSIS — I2510 Atherosclerotic heart disease of native coronary artery without angina pectoris: Secondary | ICD-10-CM

## 2021-03-12 DIAGNOSIS — I10 Essential (primary) hypertension: Secondary | ICD-10-CM

## 2021-04-25 ENCOUNTER — Other Ambulatory Visit: Payer: Self-pay | Admitting: Family Medicine

## 2021-04-25 DIAGNOSIS — I2510 Atherosclerotic heart disease of native coronary artery without angina pectoris: Secondary | ICD-10-CM

## 2021-04-25 DIAGNOSIS — E782 Mixed hyperlipidemia: Secondary | ICD-10-CM

## 2021-05-07 ENCOUNTER — Other Ambulatory Visit: Payer: Self-pay | Admitting: Family Medicine

## 2021-05-08 ENCOUNTER — Other Ambulatory Visit: Payer: Self-pay | Admitting: Family Medicine

## 2021-05-10 ENCOUNTER — Other Ambulatory Visit: Payer: Self-pay

## 2021-05-10 ENCOUNTER — Other Ambulatory Visit: Payer: Medicare PPO

## 2021-05-10 DIAGNOSIS — M109 Gout, unspecified: Secondary | ICD-10-CM | POA: Diagnosis not present

## 2021-05-10 DIAGNOSIS — E782 Mixed hyperlipidemia: Secondary | ICD-10-CM

## 2021-05-10 DIAGNOSIS — R7989 Other specified abnormal findings of blood chemistry: Secondary | ICD-10-CM | POA: Diagnosis not present

## 2021-05-10 DIAGNOSIS — Z5181 Encounter for therapeutic drug level monitoring: Secondary | ICD-10-CM

## 2021-05-11 LAB — URIC ACID: Uric Acid: 5 mg/dL (ref 3.8–8.4)

## 2021-05-11 LAB — LIPID PANEL
Chol/HDL Ratio: 3.1 ratio (ref 0.0–5.0)
Cholesterol, Total: 117 mg/dL (ref 100–199)
HDL: 38 mg/dL — ABNORMAL LOW (ref >39)
LDL Chol Calc (NIH): 58 mg/dL (ref 0–99)
Triglycerides: 112 mg/dL (ref 0–149)
VLDL Cholesterol Cal: 21 mg/dL (ref 5–40)

## 2021-05-11 LAB — HEPATIC FUNCTION PANEL
ALT: 35 IU/L (ref 0–44)
AST: 43 IU/L — ABNORMAL HIGH (ref 0–40)
Albumin: 4.5 g/dL (ref 3.8–4.8)
Alkaline Phosphatase: 66 IU/L (ref 44–121)
Bilirubin Total: 0.4 mg/dL (ref 0.0–1.2)
Bilirubin, Direct: 0.15 mg/dL (ref 0.00–0.40)
Total Protein: 7.5 g/dL (ref 6.0–8.5)

## 2021-05-14 ENCOUNTER — Other Ambulatory Visit: Payer: Self-pay | Admitting: Family Medicine

## 2021-05-14 DIAGNOSIS — I2510 Atherosclerotic heart disease of native coronary artery without angina pectoris: Secondary | ICD-10-CM

## 2021-05-14 DIAGNOSIS — E782 Mixed hyperlipidemia: Secondary | ICD-10-CM

## 2021-06-07 ENCOUNTER — Other Ambulatory Visit: Payer: Self-pay | Admitting: Family Medicine

## 2021-06-07 DIAGNOSIS — I10 Essential (primary) hypertension: Secondary | ICD-10-CM

## 2021-06-07 DIAGNOSIS — I2510 Atherosclerotic heart disease of native coronary artery without angina pectoris: Secondary | ICD-10-CM

## 2021-06-22 ENCOUNTER — Other Ambulatory Visit: Payer: Self-pay

## 2021-06-22 ENCOUNTER — Encounter: Payer: Self-pay | Admitting: Internal Medicine

## 2021-06-22 ENCOUNTER — Ambulatory Visit: Payer: Medicare PPO | Admitting: Internal Medicine

## 2021-06-22 VITALS — BP 184/90 | HR 76 | Ht 70.0 in | Wt 223.0 lb

## 2021-06-22 DIAGNOSIS — I480 Paroxysmal atrial fibrillation: Secondary | ICD-10-CM | POA: Diagnosis not present

## 2021-06-22 NOTE — Patient Instructions (Signed)
Medication Instructions:  Your physician recommends that you continue on your current medications as directed. Please refer to the Current Medication list given to you today.  *If you need a refill on your cardiac medications before your next appointment, please call your pharmacy*   Lab Work: NONE   If you have labs (blood work) drawn today and your tests are completely normal, you will receive your results only by: [place] (if you have MyChart) OR A paper copy in the mail If you have any lab test that is abnormal or we need to change your treatment, we will call you to review the results.   Testing/Procedures: NONE    Follow-Up: At [facility], you and your health needs are our priority.  As part of our continuing mission to provide you with exceptional heart care, we have created designated Provider Care Teams.  These Care Teams include your primary Cardiologist (physician) and Advanced Practice Providers (APPs -  Physician Assistants and Nurse Practitioners) who all work together to provide you with the care you need, when you need it.  We recommend signing up for the patient portal called "MyChart".  Sign up information is provided on this After Visit Summary.  MyChart is used to connect with patients for Virtual Visits (Telemedicine).  Patients are able to view lab/test results, encounter notes, upcoming appointments, etc.  Non-urgent messages can be sent to your provider as well.   To learn more about what you can do with MyChart, go to [email or web address].    Your next appointment:   2 year(s)  The format for your next appointment:   In Person  Provider:   Cristopher Peru, MD    Other Instructions Thank you for choosing [facility]!

## 2021-06-22 NOTE — Progress Notes (Signed)
HPI [name] returns today for followup. He is a pleasant 71 yo man with a h/o HTN, PAF, and CAD. He has undergone hip replacement without difficulty. He has not had chest pain. No edema. No sob. He checks his bp at home and it runs in the 120's on meedical therapy. Today he was in a hurry and running late and was a bit flustered when he arrived. He did not take his meds. He notes his SBP was 125 last night.  No Known Allergies   Current Outpatient Medications  Medication Sig Dispense Refill   allopurinol (ZYLOPRIM) 100 MG tablet TAKE 1 TABLET BY MOUTH EVERY DAY 90 tablet 0   aspirin 81 MG EC tablet Take 81 mg by mouth daily. Swallow whole.     Coenzyme Q-10 100 MG capsule Take 1 tablet by mouth daily.      ezetimibe (ZETIA) 10 MG tablet TAKE 1 TABLET BY MOUTH EVERY DAY 90 tablet 0   fish oil-omega-3 fatty acids 1000 MG capsule Take 2 g by mouth 2 (two) times daily.      metoprolol tartrate (LOPRESSOR) 25 MG tablet TAKE 1 TABLET BY MOUTH TWICE A DAY 180 tablet 0   niacin (NIASPAN) 1000 MG CR tablet Take 1 tablet (1,000 mg total) by mouth at bedtime. 90 tablet 1   nitroGLYCERIN (NITROSTAT) 0.4 MG SL tablet Place 1 tablet (0.4 mg total) under the tongue every 5 (five) minutes as needed for chest pain. 28 tablet 1   rosuvastatin (CRESTOR) 40 MG tablet TAKE 1 TABLET BY MOUTH EVERY DAY 90 tablet 0   VITAMIN D PO Take 1,000 Int'l Units by mouth daily.     XARELTO 20 MG TABS tablet TAKE 1 TABLET BY MOUTH DAILY WITH SUPPER. 90 tablet 1   No current facility-administered medications for this visit.     Past Medical History:  Diagnosis Date   Anemia    Arthritis    Atrial fibrillation with RVR ([place])    converted to NSR with IV dilt 10/12 - no coumadin due to need for Plavix and ASA   Cataract    Clotting disorder ([place])    PE 2012   Colon polyps  10/2015   (non-adenomatous)   Coronary artery disease    LHC 03/17/11: Dx 30%, OM1 30%, OM2 30%, pRCA 30%, EF 40-45% with anterolat and  apical HK   Diverticulosis  10/2015   noted on colonoscopy   Elevated LFTs    Fatty liver    Hematochezia    LIKEY FROM HEMORRHOIDAL BLEEDING; PT HAS H/O   Hyperlipidemia    Hypertension    Internal hemorrhoids  10/2015   noted on colonoscopy   NSTEMI (non-ST elevated myocardial infarction) ([place])    2012-FLET THAT NON-ST-ELEVATION MI IS POSSIBLY SECONDARY TO CORONARY EMBOLUS FROM HIS AFIB   Syncope    in setting of AFib with RVR    ROS:   All systems reviewed and negative except as noted in the HPI.   Past Surgical History:  Procedure Laterality Date   CARDIAC CATHETERIZATION  03/18/11   cardiac event monitor  04/01/11   CATARACT EXTRACTION W/PHACO Left 09/18/2014   Procedure: CATARACT EXTRACTION PHACO AND INTRAOCULAR LENS PLACEMENT LEFT EYE;  Surgeon: Tonny Branch, MD;  Location: [facility];  Service: Ophthalmology;  Laterality: Left;  CDE 13.32   COLONOSCOPY     ESOPHAGOGASTRODUODENOSCOPY ENDOSCOPY     MOUTH SURGERY     TOTAL HIP ARTHROPLASTY Left 07/10/2018  Procedure: TOTAL HIP ARTHROPLASTY ANTERIOR APPROACH;  Surgeon: Paralee Cancel, MD;  Location: [facility];  Service: Orthopedics;  Laterality: Left;  70 mins   TRANSTHORACIC ECHOCARDIOGRAM  03/19/11   WISDOM TOOTH EXTRACTION       Family History  Problem Relation Age of Onset   Hyperlipidemia Brother    Hypertension Brother    Arthritis Mother    Heart disease Neg Hx    Cancer Neg Hx    Diabetes Neg Hx    Colon cancer Neg Hx    Esophageal cancer Neg Hx    Rectal cancer Neg Hx    Stomach cancer Neg Hx      Social History   Socioeconomic History   Marital status: Married    Spouse name: Not on file   Number of children: 0   Years of education: Not on file   Highest education level: Not on file  Occupational History   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  He lost his job 02/2013;  Previously did survey work part-time (on the side)      Rescued a Daschund-Beagle mix      Updated 07/2020   Social Determinants of Radio broadcast assistant Strain: Not on file  Food Insecurity: Not on file  Transportation Needs: Not on file  Physical Activity: Not on file  Stress: Not on file  Social Connections: Not on file  Intimate Partner Violence: Not on file     BP (!) 184/90    Pulse 76    Ht 5\' 10"  (1.778 m)    Wt 223 lb (101.2 kg)    SpO2 100%    BMI 32.00 kg/m   Physical Exam:  Well appearing NAD HEENT: Unremarkable Neck:  No JVD, no thyromegally Lymphatics:  No adenopathy Back:  No CVA tenderness Lungs:  Clear with no wheezes HEART:  Regular rate rhythm, no murmurs, no rubs, no clicks Abd:  soft, positive bowel sounds, no organomegally, no rebound, no guarding Ext:  2 plus pulses, no edema, no cyanosis, no clubbing Skin:  No rashes no nodules Neuro:  CN II through XII intact, motor grossly intact  EKG - NSR   Assess/Plan:  1. PAF - he has been asymptomatic since his last visit. He will continue Xarelto and beta blocker. 2. CAD - he has been more sedentary. He is encouraged to increase his physical activity. 3. HTN - his bp is high today but he was in a hurry coming in and he notes that when he checks his bp at home it runs in the 120-130 range. 4. Coags - he has not had any bleeding. Continue Xarelto.   Mikle Bosworth.D.

## 2021-07-21 ENCOUNTER — Other Ambulatory Visit: Payer: Self-pay | Admitting: Internal Medicine

## 2021-07-21 DIAGNOSIS — I480 Paroxysmal atrial fibrillation: Secondary | ICD-10-CM

## 2021-07-22 NOTE — Telephone Encounter (Signed)
Prescription refill request for Xarelto received.  ?Indication: PAF ?Last office visit: 06/22/21  Timothy Salts MD ?Weight: 101.2kg ?Age: 71 ?Scr: 1.13 on 08/03/21 ?CrCl: 87.07 ? ?Based on above findings Xarelto 20mg  daily is the appropriate dose.  Refill approved.  Needs current CBC/BMP.  Requested at PCP visit 08/13/21 ? ?

## 2021-07-28 ENCOUNTER — Other Ambulatory Visit: Payer: Self-pay | Admitting: Family Medicine

## 2021-07-28 DIAGNOSIS — E782 Mixed hyperlipidemia: Secondary | ICD-10-CM

## 2021-08-01 ENCOUNTER — Other Ambulatory Visit: Payer: Self-pay | Admitting: Family Medicine

## 2021-08-06 ENCOUNTER — Other Ambulatory Visit: Payer: Self-pay | Admitting: Family Medicine

## 2021-08-06 NOTE — Telephone Encounter (Signed)
Pt states he needs a refill on this before his appt next week and wants 90 days ?

## 2021-08-08 ENCOUNTER — Other Ambulatory Visit: Payer: Self-pay | Admitting: Family Medicine

## 2021-08-08 DIAGNOSIS — I2510 Atherosclerotic heart disease of native coronary artery without angina pectoris: Secondary | ICD-10-CM

## 2021-08-08 DIAGNOSIS — E782 Mixed hyperlipidemia: Secondary | ICD-10-CM

## 2021-08-11 NOTE — Progress Notes (Signed)
?Chief Complaint  ?Patient presents with  ? Medicare Wellness  ?  Fasting AWV/CPE. No new concerns.   ? ? ?Timothy Estes is a 71 y.o. male who presents for a complete physical, Medicare annual wellness visit, and follow-up on chronic problems.  ? ?Fasting glucose was elevated at 109 in 07/2020. Repeat was <100 in 01/2021. He continues to try and limit sweets. ?He has had some elevations in the past, intermittently, with normal A1c. ?Lab Results  ?Component Value Date  ? HGBA1C 5.3 02/03/2021  ? ? ?Hypertension follow-up:  Blood pressure at home is running 120's/70's-80's..  It was high when he last saw the cardiologist--had been rushed, forgot to take his meds.  He reports it was repeated later in the visit and was lower (not recorded). ?BP 125/72 last night.  ?He is compliant with metoprolol '25mg'$  BID and denies side effects. Denies headaches, dizziness, chest pain, edema, dyspnea with exertion. ? ?BP Readings from Last 3 Encounters:  ?08/12/21 (!) 144/88  ?06/22/21 (!) 184/90  ?02/03/21 128/78  ? ? ?Hyperlipidemia follow-up:  Patient is reportedly following a low-fat, low cholesterol diet. Compliant with medications (crestor, niaspan, zetia and fish oil). ?Zetia was added to his rosuvastatin, niaspan and fish oil in 01/2021 due to LDL staying >70 (as suggested by his cardiologist). Recheck of lipids in 04/2021 was at goal, LDL 58. ? ?His diet includes: 1% milk, has 2 eggs/week, cut back on cheese.  Red meat 1x/week.  Mostly lots of chicken, not much fried foods (had fried fish yesterday).  ? ?Lab Results  ?Component Value Date  ? CHOL 117 05/10/2021  ? HDL 38 (L) 05/10/2021  ? [place]ler 58 05/10/2021  ? TRIG 112 05/10/2021  ? CHOLHDL 3.1 05/10/2021  ? ? ?Paroxysmal atrial fibrillation and coronary artery disease, status post non-ST elevation MI, with preserved left ventricular systolic function: He is on Xarelto and ASA '81mg'$ , and is on metoprolol. He has been maintaining sinus rhythm. He denies any significant bleeding  or bruising. His last visit with Dr. Lovena Le was in 05/2021, no changes were made. ?  ?H/o Vitamin D deficiency--level was low at 28 at his physical 02/2015. When taking 1000 IU daily, f/u level was normal in October 2017 (41).  He is currently taking 1000 IU daily. ?  ?History of iron deficiency anemia.  EGD done 01/2018 showed H.pylori gastritis on gastric biopsies, reflux changes on esophageal biopsies.  He was treated by GI with Pylera and iron twice daily.  He denies any further problems.  He denies abdominal pain, melena or hematochezia. Anemia has since completely resolved.  He continues on xarelto and denies any bleeding. Some bruising related to working in the yard, moving furniture,etc. ? ?Lab Results  ?Component Value Date  ? WBC 9.5 02/03/2021  ? HGB 15.4 02/03/2021  ? HCT 44.5 02/03/2021  ? MCV 88 02/03/2021  ? PLT 165 02/03/2021  ? ? ?Gout:  Diagnosed in May, 2018 when he presented with bilateral knee effusions. He was started on '300mg'$  of allopurinol daily, and f/u uric acid level was normal. He has colchicine to use prn for any flare. He has had flares of gout (at knee) in the past related to increased beer intake. He hasn't had flares since being on allopurinol. He no longer drinks alcohol. His uric acid level in 01/2021 was 3.2.  His dose was lowered to '100mg'$  of allpurinol daily, and recheck in December was still good at 5.0.  (we had discussed that this dose would likely  be effective at preventing gout as long as he wasn't drinking alcohol).  He continues to not drink alcohol, and not have any gout flares. ? ?Lab Results  ?Component Value Date  ? LABURIC 5.0 05/10/2021  ? ?  ?Elevated LFT's:  Ultrasound in the past showed fatty liver changes, last done in 2012. LFT's have been persistently elevated.  He no longer drinks alcohol. ?NASH FibroSURE testing indicated some fibrosis of the liver, with probable NASH (non-alcoholic steatohepatitis).  Repeat LFT's had improved somewhat (prev had been running  50's-70's, in the 40's on the last 2 checks). ? ?Lab Results  ?Component Value Date  ? ALT 35 05/10/2021  ? AST 43 (H) 05/10/2021  ? ALKPHOS 66 05/10/2021  ? BILITOT 0.4 05/10/2021  ? ? ?Immunization History  ?Administered Date(s) Administered  ? Fluad Quad(high Dose 65+) 02/18/2019, 02/03/2021  ? Influenza Split 03/15/2012  ? Influenza, High Dose Seasonal PF 04/03/2017, 03/30/2018  ? Influenza,inj,Quad PF,6+ Mos 01/23/2013, 01/22/2014, 02/24/2015, 03/10/2016  ? Influenza,inj,quad, With Preservative 02/20/2017  ? Moderna Covid-19 Vaccine Bivalent Booster 39yr & up 04/26/2021  ? Moderna Sars-Covid-2 Vaccination 07/04/2019, 08/02/2019, 04/14/2020  ? Pneumococcal Conjugate-13 09/22/2016  ? Pneumococcal Polysaccharide-23 10/30/2017  ? Tdap 07/10/2012  ? Zoster Recombinat (Shingrix) 12/09/2016, 04/04/2017  ? Zoster, Live 07/10/2012  ? ?Last colonoscopy: 10/2015 Dr. SFuller Plan(benign polyps, repeat 10 years) ?Last PSA:   ?Lab Results  ?Component Value Date  ? PSA1 0.8 11/04/2019  ? PSA1 0.7 10/30/2018  ? PSA1 0.6 10/26/2017  ? PSA 0.5 09/22/2016  ? PSA 0.54 09/03/2015  ? PSA 0.44 07/24/2014  ? ?Dentist: going regularly ?Ophtho: yearly ?Exercise: Walking 30-45 minutes every day.  Occasionally uses some small hand weights. He is very active doing yardwork, and cleaning out building (lifting, moving furniture). ? ?Patient Care Team: ?KRita Ohara MD as PCP - General (Family Medicine) ?TEvans Lance MD as PCP - Cardiology (Cardiology) ?SLadene Artist MD as Consulting Physician (Gastroenterology) ?JCelestia Khat OD (Optometry) ?BCostella Hatcher DMD (Oral Surgery) ?OParalee Cancel MD as Consulting Physician (Orthopedic Surgery) ?Dentist: Dr. MLynnette Caffey? ? ?Depression Screening: ?[place] [facility] ?PHQ-2 Total Score 0  ? ?  ?  ? ?Falls screen:  ? ?  08/12/2021  ?  9:41 AM 02/03/2021  ?  9:32 AM 08/03/2020  ?  9:32 AM 11/01/2018  ?  8:16 AM 10/30/2017  ?  1:56 PM  ?Fall Risk   ?Falls in  the past year? 0 0 1 0 Yes  ?Number falls in past yr: 0 0 0  1  ?Injury with Fall? 0 0 0  No  ?Comment   fell in house, hit the couch-no injury (more like a stumble)    ?Risk for fall due to : No Fall Risks No Fall Risks     ?Follow up Falls evaluation completed Falls evaluation completed     ?  ? ?Functional Status Survey: ?Is the patient deaf or have difficulty hearing?: No ?Does the patient have difficulty seeing, even when wearing glasses/contacts?: No ?Does the patient have difficulty concentrating, remembering, or making decisions?: No ?Does the patient have difficulty walking or climbing stairs?: No ?Does the patient have difficulty dressing or bathing?: No ?Does the patient have difficulty doing errands alone such as visiting a doctor's office or shopping?: No ? ?Mini-Cog Scoring: 5  ? ?  ?End of Life Discussion:  Patient does have a living will and medical power of attorney. He brought these in  today to be scanned into his chart. ? ? ? ?PMH, PSH, SH and FH reviewed and updated ? ?Outpatient Encounter Medications as of 08/12/2021  ?Medication Sig  ? allopurinol (ZYLOPRIM) 100 MG tablet TAKE 1 TABLET BY MOUTH EVERY DAY  ? aspirin 81 MG EC tablet Take 81 mg by mouth daily. Swallow whole.  ? Coenzyme Q-10 100 MG capsule Take 1 tablet by mouth daily.   ? ezetimibe (ZETIA) 10 MG tablet TAKE 1 TABLET BY MOUTH EVERY DAY  ? fish oil-omega-3 fatty acids 1000 MG capsule Take 2 g by mouth 2 (two) times daily.   ? metoprolol tartrate (LOPRESSOR) 25 MG tablet TAKE 1 TABLET BY MOUTH TWICE A DAY  ? niacin (NIASPAN) 1000 MG CR tablet TAKE 1 TABLET (1,000 MG TOTAL) BY MOUTH AT BEDTIME.  ? rosuvastatin (CRESTOR) 40 MG tablet TAKE 1 TABLET BY MOUTH EVERY DAY  ? VITAMIN D PO Take 1,000 Int'l Units by mouth daily.  ? XARELTO 20 MG TABS tablet TAKE 1 TABLET BY MOUTH DAILY WITH SUPPER  ? nitroGLYCERIN (NITROSTAT) 0.4 MG SL tablet Place 1 tablet (0.4 mg total) under the tongue every 5 (five) minutes as needed for chest pain.  (Patient not taking: Reported on 08/12/2021)  ? ?No facility-administered encounter medications on file as of 08/12/2021.  ? ?No Known Allergies ? ?ROS: The patient denies anorexia, fever, headaches, weight change

## 2021-08-11 NOTE — Patient Instructions (Addendum)
?  HEALTH MAINTENANCE RECOMMENDATIONS: ? ?It is recommended that you get at least 30 minutes of aerobic exercise at least 5 days/week (for weight loss, you may need as much as 60-90 minutes). This can be any activity that gets your heart rate up. This can be divided in 10-15 minute intervals if needed, but try and build up your endurance at least once a week.  Weight bearing exercise is also recommended twice weekly. ? ?Eat a healthy diet with lots of vegetables, fruits and fiber.  "Colorful" foods have a lot of vitamins (ie green vegetables, tomatoes, red peppers, etc).  Limit sweet tea, regular sodas and alcoholic beverages, all of which has a lot of calories and sugar.  Up to 2 alcoholic drinks daily may be beneficial for men (unless trying to lose weight, watch sugars).  Drink a lot of water. ? ?Sunscreen of at least SPF 30 should be used on all sun-exposed parts of the skin when outside between the hours of 10 am and 4 pm (not just when at beach or pool, but even with exercise, golf, tennis, and yard work!)  Use a sunscreen that says "broad spectrum" so it covers both UVA and UVB rays, and make sure to reapply every 1-2 hours. ? ?Remember to change the batteries in your smoke detectors when changing your clock times in the spring and fall.  Carbon monoxide detectors are recommended for your home. ? ?Use your seat belt every time you are in a car, and please drive safely and not be distracted with cell phones and texting while driving. ? ? ? ?[name] , ?Thank you for taking time to come for your Medicare Wellness Visit. I appreciate your ongoing commitment to your health goals. Please review the following plan we discussed and let me know if I can assist you in the future.  ? ?This is a list of the screening recommended for you and due dates:  ?Health Maintenance  ?Topic Date Due  ? Tetanus Vaccine  07/10/2022  ? Colon Cancer Screening  11/11/2025  ? Pneumonia Vaccine  Completed  ? Flu Shot  Completed  ?  COVID-19 Vaccine  Completed  ? Hepatitis C Screening: USPSTF Recommendation to screen - Ages 19-79 yo.  Completed  ? Zoster (Shingles) Vaccine  Completed  ? HPV Vaccine  Aged Out  ? ?You will be due for a tetanus booster next year (TdaP).  You will need to get this from the pharmacy. ? ? ?

## 2021-08-12 ENCOUNTER — Encounter: Payer: Self-pay | Admitting: Family Medicine

## 2021-08-12 ENCOUNTER — Other Ambulatory Visit: Payer: Self-pay

## 2021-08-12 ENCOUNTER — Ambulatory Visit: Payer: Medicare PPO | Admitting: Family Medicine

## 2021-08-12 VITALS — BP 136/80 | HR 64 | Ht 69.0 in | Wt 223.0 lb

## 2021-08-12 DIAGNOSIS — E6609 Other obesity due to excess calories: Secondary | ICD-10-CM | POA: Diagnosis not present

## 2021-08-12 DIAGNOSIS — Z7901 Long term (current) use of anticoagulants: Secondary | ICD-10-CM

## 2021-08-12 DIAGNOSIS — Z5181 Encounter for therapeutic drug level monitoring: Secondary | ICD-10-CM | POA: Diagnosis not present

## 2021-08-12 DIAGNOSIS — Z6832 Body mass index (BMI) 32.0-32.9, adult: Secondary | ICD-10-CM

## 2021-08-12 DIAGNOSIS — M1712 Unilateral primary osteoarthritis, left knee: Secondary | ICD-10-CM | POA: Diagnosis not present

## 2021-08-12 DIAGNOSIS — K760 Fatty (change of) liver, not elsewhere classified: Secondary | ICD-10-CM | POA: Diagnosis not present

## 2021-08-12 DIAGNOSIS — I2510 Atherosclerotic heart disease of native coronary artery without angina pectoris: Secondary | ICD-10-CM

## 2021-08-12 DIAGNOSIS — I10 Essential (primary) hypertension: Secondary | ICD-10-CM | POA: Diagnosis not present

## 2021-08-12 DIAGNOSIS — Z0000 Encounter for general adult medical examination without abnormal findings: Secondary | ICD-10-CM

## 2021-08-12 DIAGNOSIS — Z125 Encounter for screening for malignant neoplasm of prostate: Secondary | ICD-10-CM

## 2021-08-12 DIAGNOSIS — M109 Gout, unspecified: Secondary | ICD-10-CM

## 2021-08-12 DIAGNOSIS — E782 Mixed hyperlipidemia: Secondary | ICD-10-CM | POA: Diagnosis not present

## 2021-08-12 DIAGNOSIS — R7301 Impaired fasting glucose: Secondary | ICD-10-CM | POA: Diagnosis not present

## 2021-08-12 DIAGNOSIS — E559 Vitamin D deficiency, unspecified: Secondary | ICD-10-CM

## 2021-08-12 DIAGNOSIS — I4891 Unspecified atrial fibrillation: Secondary | ICD-10-CM | POA: Diagnosis not present

## 2021-08-12 MED ORDER — ROSUVASTATIN CALCIUM 40 MG PO TABS: 40.0000 mg | ORAL_TABLET | Freq: Every day | ORAL | 1 refills | Status: DC

## 2021-08-12 MED ORDER — METOPROLOL TARTRATE 25 MG PO TABS: 25.0000 mg | ORAL_TABLET | Freq: Two times a day (BID) | ORAL | 1 refills | Status: DC

## 2021-08-13 LAB — CBC WITH DIFFERENTIAL/PLATELET
Basophils Absolute: 0.1 10*3/uL (ref 0.0–0.2)
Basos: 1 %
EOS (ABSOLUTE): 0.5 10*3/uL — ABNORMAL HIGH (ref 0.0–0.4)
Eos: 6 %
Hematocrit: 46.1 % (ref 37.5–51.0)
Hemoglobin: 15.7 g/dL (ref 13.0–17.7)
Immature Grans (Abs): 0 10*3/uL (ref 0.0–0.1)
Immature Granulocytes: 0 %
Lymphocytes Absolute: 2.8 10*3/uL (ref 0.7–3.1)
Lymphs: 34 %
MCH: 29.6 pg (ref 26.6–33.0)
MCHC: 34.1 g/dL (ref 31.5–35.7)
MCV: 87 fL (ref 79–97)
Monocytes Absolute: 0.9 10*3/uL (ref 0.1–0.9)
Monocytes: 11 %
Neutrophils Absolute: 4 10*3/uL (ref 1.4–7.0)
Neutrophils: 48 %
Platelets: 182 10*3/uL (ref 150–450)
RBC: 5.31 x10E6/uL (ref 4.14–5.80)
RDW: 13.8 % (ref 11.6–15.4)
WBC: 8.2 10*3/uL (ref 3.4–10.8)

## 2021-08-13 LAB — BASIC METABOLIC PANEL
BUN/Creatinine Ratio: 16 (ref 10–24)
BUN: 15 mg/dL (ref 8–27)
CO2: 24 mmol/L (ref 20–29)
Calcium: 9.7 mg/dL (ref 8.6–10.2)
Chloride: 102 mmol/L (ref 96–106)
Creatinine, Ser: 0.95 mg/dL (ref 0.76–1.27)
Glucose: 101 mg/dL — ABNORMAL HIGH (ref 70–99)
Potassium: 4.3 mmol/L (ref 3.5–5.2)
Sodium: 141 mmol/L (ref 134–144)
eGFR: 86 mL/min/{1.73_m2} (ref >59)

## 2021-08-13 LAB — PSA: Prostate Specific Ag, Serum: 1.8 ng/mL (ref 0.0–4.0)

## 2021-10-22 ENCOUNTER — Other Ambulatory Visit: Payer: Self-pay | Admitting: Family Medicine

## 2021-10-22 DIAGNOSIS — E782 Mixed hyperlipidemia: Secondary | ICD-10-CM

## 2021-10-29 ENCOUNTER — Other Ambulatory Visit: Payer: Self-pay | Admitting: Family Medicine

## 2021-11-02 ENCOUNTER — Other Ambulatory Visit: Payer: Self-pay | Admitting: Family Medicine

## 2021-11-02 NOTE — Telephone Encounter (Signed)
Has an appt in september 

## 2022-01-14 ENCOUNTER — Other Ambulatory Visit: Payer: Self-pay | Admitting: Internal Medicine

## 2022-01-14 DIAGNOSIS — I480 Paroxysmal atrial fibrillation: Secondary | ICD-10-CM

## 2022-01-14 NOTE — Telephone Encounter (Signed)
Prescription refill request for Xarelto received.  Indication:Afib Last office visit:1/23 Weight:101.2 kg Age:71 Scr:0.9 CrCl:109.32  ml/min  Prescription refilled

## 2022-01-19 ENCOUNTER — Other Ambulatory Visit: Payer: Self-pay | Admitting: Family Medicine

## 2022-01-19 DIAGNOSIS — E782 Mixed hyperlipidemia: Secondary | ICD-10-CM

## 2022-01-24 ENCOUNTER — Other Ambulatory Visit: Payer: Self-pay | Admitting: Family Medicine

## 2022-01-25 NOTE — Telephone Encounter (Signed)
Spoke to patient and he doesn't think he needs a refill on this before appt. I will deny

## 2022-01-30 ENCOUNTER — Other Ambulatory Visit: Payer: Self-pay | Admitting: Family Medicine

## 2022-02-03 ENCOUNTER — Other Ambulatory Visit: Payer: Self-pay | Admitting: Family Medicine

## 2022-02-03 DIAGNOSIS — I2510 Atherosclerotic heart disease of native coronary artery without angina pectoris: Secondary | ICD-10-CM

## 2022-02-03 DIAGNOSIS — E782 Mixed hyperlipidemia: Secondary | ICD-10-CM

## 2022-02-16 NOTE — Patient Instructions (Signed)
I recommend getting the new RSV vaccine--you will need to get this from the pharmacy. New COVID booster is also recommended. These vaccines should be separated by 2 weeks from today's flu shot and each other.  You will be due for a tetanus booster (TdaP) in February 2024. You will need to get this from the pharmacy.

## 2022-02-16 NOTE — Progress Notes (Signed)
Chief Complaint  Patient presents with   Hypertension    Fasting med check, no concerns.    Patient presents for 6 month follow-up on chronic problems.   Hypertension follow-up:  Blood pressure at home is running 120's-130/70's-80's.   119/77 last night. He is compliant with metoprolol 40m BID and denies side effects. Denies headaches, dizziness, chest pain, edema, dyspnea with exertion.   BP Readings from Last 3 Encounters:  02/17/22 130/80  08/12/21 136/80  06/22/21 (!) 184/90   Hyperlipidemia follow-up:  Patient is reportedly following a low-fat, low cholesterol diet. Compliant with medications (crestor, niaspan, zetia and fish oil). Occasional flushing (once a month or less), otherwise no side effects. Zetia was added to his rosuvastatin, niaspan and fish oil in 01/2021 due to LDL staying >70 (as suggested by his cardiologist). Recheck of lipids in 04/2021 was at goal, LDL 58.   His diet includes: 1% milk, has 2 eggs/week, cut back on cheese.  Red meat 1x/week.  Mostly lots of chicken, fried foods 0-2x/week, when out.   Lab Results  Component Value Date   CHOL 117 05/10/2021   HDL 38 (L) 05/10/2021   LDLCALC 58 05/10/2021   TRIG 112 05/10/2021   CHOLHDL 3.1 05/10/2021    Paroxysmal atrial fibrillation and coronary artery disease, status post non-ST elevation MI, with preserved left ventricular systolic function: He is on Xarelto and ASA 830m and is on metoprolol. He has been maintaining sinus rhythm. He denies any significant bleeding or bruising. His last visit with Dr. TaLovena Leas in 05/2021, no changes were made.   H/o Vitamin D deficiency--level was low at 28 at his physical 02/2015. When taking 1000 IU daily, f/u level was normal in October 2017 (41).  He is currently taking 1000 IU daily.   History of iron deficiency anemia.  EGD done 01/2018 showed H.pylori gastritis on gastric biopsies, reflux changes on esophageal biopsies.  He was treated by GI with Pylera and iron. He  denies any further problems, denies abdominal pain, melena or hematochezia. Anemia has resolved.  He continues on xarelto and denies any bleeding. Some bruising related to working in the yard.   Lab Results  Component Value Date   WBC 8.2 08/12/2021   HGB 15.7 08/12/2021   HCT 46.1 08/12/2021   MCV 87 08/12/2021   PLT 182 08/12/2021     Gout:  Diagnosed in May, 2018 when he presented with bilateral knee effusions. He was started on 30021mf allopurinol daily, and f/u uric acid level was normal. He has colchicine to use prn for any flare. He has had flares of gout (at knee) in the past related to increased beer intake. He hasn't had flares since being on allopurinol. He no longer drinks alcohol. His uric acid level in 01/2021 was 3.2.  His dose was lowered to 100m40m allpurinol daily, and recheck in December was still good at 5.0.  (we had discussed that this dose would likely be effective at preventing gout as long as he wasn't drinking alcohol).  He continues to not drink alcohol, and not have any gout flares.   Lab Results  Component Value Date   LABURIC 5.0 05/10/2021     Elevated LFT's:  Ultrasound in the past showed fatty liver changes, last done in 2012. LFT's have been persistently elevated.  He no longer drinks alcohol. NASH FibroSURE testing indicated some fibrosis of the liver, with probable NASH (non-alcoholic steatohepatitis).  Repeat LFT's had improved somewhat (prev had been running  50's-70's, in the 40's on the last 2 checks).  Lab Results  Component Value Date   ALT 35 05/10/2021   AST 43 (H) 05/10/2021   ALKPHOS 66 05/10/2021   BILITOT 0.4 05/10/2021    PMH, PSH, SH reviewed  Outpatient Encounter Medications as of 02/17/2022  Medication Sig   allopurinol (ZYLOPRIM) 100 MG tablet TAKE 1 TABLET BY MOUTH EVERY DAY   aspirin 81 MG EC tablet Take 81 mg by mouth daily. Swallow whole.   Coenzyme Q-10 100 MG capsule Take 1 tablet by mouth daily.    ezetimibe (ZETIA) 10 MG  tablet TAKE 1 TABLET BY MOUTH EVERY DAY   fish oil-omega-3 fatty acids 1000 MG capsule Take 2 g by mouth 2 (two) times daily.    metoprolol tartrate (LOPRESSOR) 25 MG tablet Take 1 tablet (25 mg total) by mouth 2 (two) times daily.   niacin (NIASPAN) 1000 MG CR tablet TAKE 1 TABLET (1,000 MG TOTAL) BY MOUTH AT BEDTIME.   rosuvastatin (CRESTOR) 40 MG tablet TAKE 1 TABLET BY MOUTH EVERY DAY   VITAMIN D PO Take 1,000 Int'l Units by mouth daily.   XARELTO 20 MG TABS tablet TAKE 1 TABLET BY MOUTH DAILY WITH SUPPER   nitroGLYCERIN (NITROSTAT) 0.4 MG SL tablet Place 1 tablet (0.4 mg total) under the tongue every 5 (five) minutes as needed for chest pain. (Patient not taking: Reported on 08/12/2021)   No facility-administered encounter medications on file as of 02/17/2022.   No Known Allergies  ROS:  No fever, chills, URI or allergy symptoms, cough, shortness of breath. rare flushing (once a month or less). No heart burn, n/v/d or other bowel changes. No abdominal pain. Denies urinary concerns (dysuria, incontinence, not getting up to void at night). No bleeding, no unexplained bruising. Moods are good, no depression, anxiety, insomnia. Denies joint pains.   PHYSICAL EXAM:  BP 130/80   Pulse 64   Ht '5\' 9"'  (1.753 m)   Wt 217 lb (98.4 kg)   BMI 32.05 kg/m   Wt Readings from Last 3 Encounters:  02/17/22 217 lb (98.4 kg)  08/12/21 223 lb (101.2 kg)  06/22/21 223 lb (101.2 kg)   Pleasant male in good spirits.  HEENT: PERRL, EOMI, conjunctiva and sclera are clear. EOMI Neck: no lymphadenopathy, thyromegaly, mass or bruit Heart: regular rate and rhythm, 2/6 SEM at LUSB Lungs: clear bilaterally Abdomen: soft, nontender, no organomegaly or mass Back: no spinal or CVA tenderness Extremities: no edema.   Neuro: alert and oriented Psych: normal mood, affect, hygiene and grooming    ASSESSMENT/PLAN:  Mixed hyperlipidemia - cont current regimen, low cholesterol, lowfat diet reviewed - Plan:  Lipid panel  Long term current use of anticoagulant - Plan: CBC with Differential/Platelet  Atrial fibrillation, unspecified type (HCC) - in sinus rhythm.  Anticoagulated. Sees cardiologist regularly  Gout of knee, unspecified cause, unspecified chronicity, unspecified laterality - no flares, on lower dose of allopurinol - Plan: Uric acid, allopurinol (ZYLOPRIM) 100 MG tablet  Vitamin D deficiency - continue supplements  Fatty liver disease, nonalcoholic - continue weight loss, lipid-lowering meds - Plan: Comprehensive metabolic panel  Need for influenza vaccination - Plan: Flu Vaccine QUAD High Dose(Fluad)  Essential hypertension - Controlled. Cont low Na diet, current meds, cont to monitor at home - Plan: metoprolol tartrate (LOPRESSOR) 25 MG tablet, Comprehensive metabolic panel  Coronary artery disease involving native coronary artery of native heart without angina pectoris - stable; asymptomatic - Plan: metoprolol tartrate (LOPRESSOR) 25 MG tablet  Medication monitoring encounter - Plan: Comprehensive metabolic panel, CBC with Differential/Platelet, Lipid panel, Uric acid  HD flu shot given. Discussed recommendations for COVID booster and RSV vaccine (from pharm). Tdap due 06/2022, to get from pharmacy  Counseled re: wt, exercise, diet  Cbc, c-met, uric acid, lipid  F/u 6 mos CPE/AWV

## 2022-02-17 ENCOUNTER — Ambulatory Visit: Payer: Medicare PPO | Admitting: Family Medicine

## 2022-02-17 ENCOUNTER — Encounter: Payer: Self-pay | Admitting: Family Medicine

## 2022-02-17 VITALS — BP 130/80 | HR 64 | Ht 69.0 in | Wt 217.0 lb

## 2022-02-17 DIAGNOSIS — Z5181 Encounter for therapeutic drug level monitoring: Secondary | ICD-10-CM

## 2022-02-17 DIAGNOSIS — E559 Vitamin D deficiency, unspecified: Secondary | ICD-10-CM | POA: Diagnosis not present

## 2022-02-17 DIAGNOSIS — K760 Fatty (change of) liver, not elsewhere classified: Secondary | ICD-10-CM | POA: Diagnosis not present

## 2022-02-17 DIAGNOSIS — I4891 Unspecified atrial fibrillation: Secondary | ICD-10-CM

## 2022-02-17 DIAGNOSIS — I2510 Atherosclerotic heart disease of native coronary artery without angina pectoris: Secondary | ICD-10-CM

## 2022-02-17 DIAGNOSIS — E782 Mixed hyperlipidemia: Secondary | ICD-10-CM

## 2022-02-17 DIAGNOSIS — I10 Essential (primary) hypertension: Secondary | ICD-10-CM

## 2022-02-17 DIAGNOSIS — Z7901 Long term (current) use of anticoagulants: Secondary | ICD-10-CM

## 2022-02-17 DIAGNOSIS — Z23 Encounter for immunization: Secondary | ICD-10-CM | POA: Diagnosis not present

## 2022-02-17 DIAGNOSIS — M109 Gout, unspecified: Secondary | ICD-10-CM

## 2022-02-17 MED ORDER — METOPROLOL TARTRATE 25 MG PO TABS: 25.0000 mg | ORAL_TABLET | Freq: Two times a day (BID) | ORAL | 1 refills | Status: DC

## 2022-02-18 LAB — CBC WITH DIFFERENTIAL/PLATELET
Basophils Absolute: 0.1 10*3/uL (ref 0.0–0.2)
Basos: 1 %
EOS (ABSOLUTE): 0.4 10*3/uL (ref 0.0–0.4)
Eos: 5 %
Hematocrit: 44.8 % (ref 37.5–51.0)
Hemoglobin: 14.7 g/dL (ref 13.0–17.7)
Immature Grans (Abs): 0 10*3/uL (ref 0.0–0.1)
Immature Granulocytes: 0 %
Lymphocytes Absolute: 2.5 10*3/uL (ref 0.7–3.1)
Lymphs: 32 %
MCH: 29.1 pg (ref 26.6–33.0)
MCHC: 32.8 g/dL (ref 31.5–35.7)
MCV: 89 fL (ref 79–97)
Monocytes Absolute: 0.8 10*3/uL (ref 0.1–0.9)
Monocytes: 10 %
Neutrophils Absolute: 4 10*3/uL (ref 1.4–7.0)
Neutrophils: 52 %
Platelets: 161 10*3/uL (ref 150–450)
RBC: 5.05 x10E6/uL (ref 4.14–5.80)
RDW: 13.3 % (ref 11.6–15.4)
WBC: 7.6 10*3/uL (ref 3.4–10.8)

## 2022-02-18 LAB — COMPREHENSIVE METABOLIC PANEL
ALT: 9 IU/L (ref 0–44)
AST: 47 IU/L — ABNORMAL HIGH (ref 0–40)
Albumin/Globulin Ratio: 1.3 (ref 1.2–2.2)
Albumin: 4.4 g/dL (ref 3.9–4.9)
Alkaline Phosphatase: 61 IU/L (ref 44–121)
BUN/Creatinine Ratio: 12 (ref 10–24)
BUN: 13 mg/dL (ref 8–27)
Bilirubin Total: 0.8 mg/dL (ref 0.0–1.2)
CO2: 23 mmol/L (ref 20–29)
Calcium: 9.5 mg/dL (ref 8.6–10.2)
Chloride: 101 mmol/L (ref 96–106)
Creatinine, Ser: 1.09 mg/dL (ref 0.76–1.27)
Globulin, Total: 3.3 g/dL (ref 1.5–4.5)
Glucose: 99 mg/dL (ref 70–99)
Potassium: 4.3 mmol/L (ref 3.5–5.2)
Sodium: 141 mmol/L (ref 134–144)
Total Protein: 7.7 g/dL (ref 6.0–8.5)
eGFR: 73 mL/min/{1.73_m2} (ref >59)

## 2022-02-18 LAB — URIC ACID: Uric Acid: 3.7 mg/dL — ABNORMAL LOW (ref 3.8–8.4)

## 2022-02-18 LAB — LIPID PANEL
Chol/HDL Ratio: 2.8 ratio (ref 0.0–5.0)
Cholesterol, Total: 118 mg/dL (ref 100–199)
HDL: 42 mg/dL (ref >39)
LDL Chol Calc (NIH): 55 mg/dL (ref 0–99)
Triglycerides: 115 mg/dL (ref 0–149)
VLDL Cholesterol Cal: 21 mg/dL (ref 5–40)

## 2022-02-18 MED ORDER — ALLOPURINOL 100 MG PO TABS: 100.0000 mg | ORAL_TABLET | Freq: Every day | ORAL | 3 refills | Status: DC

## 2022-04-17 ENCOUNTER — Other Ambulatory Visit: Payer: Self-pay | Admitting: Family Medicine

## 2022-04-17 DIAGNOSIS — E782 Mixed hyperlipidemia: Secondary | ICD-10-CM

## 2022-04-28 ENCOUNTER — Other Ambulatory Visit: Payer: Self-pay | Admitting: Family Medicine

## 2022-05-01 ENCOUNTER — Other Ambulatory Visit: Payer: Self-pay | Admitting: Family Medicine

## 2022-05-01 DIAGNOSIS — I2510 Atherosclerotic heart disease of native coronary artery without angina pectoris: Secondary | ICD-10-CM

## 2022-05-01 DIAGNOSIS — E782 Mixed hyperlipidemia: Secondary | ICD-10-CM

## 2022-07-14 ENCOUNTER — Other Ambulatory Visit: Payer: Self-pay | Admitting: Family Medicine

## 2022-07-14 DIAGNOSIS — E782 Mixed hyperlipidemia: Secondary | ICD-10-CM

## 2022-07-17 ENCOUNTER — Other Ambulatory Visit: Payer: Self-pay | Admitting: Internal Medicine

## 2022-07-17 DIAGNOSIS — I480 Paroxysmal atrial fibrillation: Secondary | ICD-10-CM

## 2022-07-18 NOTE — Telephone Encounter (Signed)
Per office visit note on 06/22/21:  "Follow-up at next appointment in 2 years"   Appropriate dose. Refill sent.

## 2022-07-18 NOTE — Telephone Encounter (Signed)
Prescription refill request for Xarelto received.  Indication: Afib  Last office visit: 06/22/21 Timothy Estes)  Weight: 98.4kg Age: 72 Scr: 1.09 (02/17/22) CrCl: 86.4m/min  Office visit overdue.

## 2022-07-24 ENCOUNTER — Other Ambulatory Visit: Payer: Self-pay | Admitting: Family Medicine

## 2022-07-28 ENCOUNTER — Other Ambulatory Visit: Payer: Self-pay | Admitting: Family Medicine

## 2022-07-28 DIAGNOSIS — E782 Mixed hyperlipidemia: Secondary | ICD-10-CM

## 2022-07-28 DIAGNOSIS — I2510 Atherosclerotic heart disease of native coronary artery without angina pectoris: Secondary | ICD-10-CM

## 2022-08-11 ENCOUNTER — Other Ambulatory Visit: Payer: Self-pay | Admitting: Family Medicine

## 2022-08-11 DIAGNOSIS — I10 Essential (primary) hypertension: Secondary | ICD-10-CM

## 2022-08-11 DIAGNOSIS — I2510 Atherosclerotic heart disease of native coronary artery without angina pectoris: Secondary | ICD-10-CM

## 2022-08-31 NOTE — Progress Notes (Signed)
No chief complaint on file.   Timothy Estes is a 72 y.o. male who presents for a complete physical, Medicare annual wellness visit, and follow-up on chronic problems.   Hypertension follow-up:  Blood pressure at home is running     He is compliant with metoprolol 25mg  BID and denies side effects. Denies headaches, dizziness, chest pain, edema, dyspnea with exertion.   BP Readings from Last 3 Encounters:  02/17/22 130/80  08/12/21 136/80  06/22/21 (!) 184/90    Hyperlipidemia follow-up:  Patient is reportedly following a low-fat, low cholesterol diet. Compliant with medications (crestor, niaspan, zetia and fish oil). Occasional flushing (once a month or less), otherwise no side effects. Zetia was added to his rosuvastatin, niaspan and fish oil in 01/2021 due to LDL staying >70.  LDL was at goal on last check on this regimen.  His diet includes: 1% milk, has 2 eggs/week, cut back on cheese.  Red meat 1x/week.  Mostly lots of chicken, fried foods 0-2x/week, when out.   Lab Results  Component Value Date   CHOL 118 02/17/2022   HDL 42 02/17/2022   LDLCALC 55 02/17/2022   TRIG 115 02/17/2022   CHOLHDL 2.8 02/17/2022    Paroxysmal atrial fibrillation and coronary artery disease, status post non-ST elevation MI, with preserved left ventricular systolic function: He is on Xarelto and ASA 81mg , and is on metoprolol. He has been maintaining sinus rhythm. He denies any significant bleeding or bruising. His last visit with Dr. Ladona Ridgel was in 05/2021, no changes were made. Per notes, 2 year f/u was recommended.   H/o Vitamin D deficiency--level was low at 28 at his physical 02/2015. When taking 1000 IU daily, f/u level was normal in October 2017 (41).  He is currently taking 1000 IU daily.   History of iron deficiency anemia.  EGD done 01/2018 showed H.pylori gastritis on gastric biopsies, reflux changes on esophageal biopsies.  He was treated by GI with Pylera and iron. He denies any further problems,  denies abdominal pain, melena or hematochezia. Anemia has resolved.  He continues on xarelto and aspirin, and denies any bleeding.   Lab Results  Component Value Date   WBC 7.6 02/17/2022   HGB 14.7 02/17/2022   HCT 44.8 02/17/2022   MCV 89 02/17/2022   PLT 161 02/17/2022     Gout:  Diagnosed in May, 2018 when he presented with bilateral knee effusions. He was started on 300mg  of allopurinol daily, and f/u uric acid level was normal. He has colchicine to use prn for any flare. He has had flares of gout (at knee) in the past related to increased beer intake. He hasn't had flares since being on allopurinol. He no longer drinks alcohol. His uric acid level in 01/2021 was 3.2.  His dose was lowered to 100mg  of allpurinol daily, and recheck in December was still good at 5.0.  (we had discussed that this dose would likely be effective at preventing gout as long as he wasn't drinking alcohol).  He continues to not drink alcohol, and not have any gout flares.   Lab Results  Component Value Date   LABURIC 3.7 (L) 02/17/2022     Elevated LFT's, probably NASH:  Ultrasound in the past showed fatty liver changes, last done in 2012. NASH FibroSURE testing indicated some fibrosis of the liver, with probable NASH (non-alcoholic steatohepatitis).  Repeat LFT's had improved somewhat. He no longer drinks any alcohol.  Lab Results  Component Value Date   ALT 9 02/17/2022  AST 47 (H) 02/17/2022   ALKPHOS 61 02/17/2022   BILITOT 0.8 02/17/2022     Immunization History  Administered Date(s) Administered   Covid-19, Mrna,Vaccine(Spikevax)5810yrs and older 04/11/2022   Fluad Quad(high Dose 65+) 02/18/2019, 02/03/2021, 02/17/2022   Influenza Split 03/15/2012   Influenza, High Dose Seasonal PF 04/03/2017, 03/30/2018   Influenza,inj,Quad PF,6+ Mos 01/23/2013, 01/22/2014, 02/24/2015, 03/10/2016   Influenza,inj,quad, With Preservative 02/20/2017   Moderna Covid-19 Vaccine Bivalent Booster 3458yrs & up 04/26/2021    Moderna Sars-Covid-2 Vaccination 07/04/2019, 08/02/2019, 04/14/2020   Pneumococcal Conjugate-13 09/22/2016   Pneumococcal Polysaccharide-23 10/30/2017   Respiratory Syncytial Virus Vaccine,Recomb Aduvanted(Arexvy) 04/25/2022   Tdap 07/10/2012   Zoster Recombinat (Shingrix) 12/09/2016, 04/04/2017   Zoster, Live 07/10/2012   Last colonoscopy: 10/2015 Dr. Russella DarStark (benign polyps, repeat 10 years) Last PSA:   Lab Results  Component Value Date   PSA1 1.8 08/12/2021   PSA1 0.8 11/04/2019   PSA1 0.7 10/30/2018   PSA 0.5 09/22/2016   PSA 0.54 09/03/2015   PSA 0.44 07/24/2014   Dentist: going regularly Ophtho: yearly Exercise:  Walking 30-45 minutes every day.  Occasionally uses some small hand weights.  He is very active doing yardwork, and cleaning out building (lifting, moving furniture).   Patient Care Team: Joselyn ArrowKnapp, Wende Longstreth, MD as PCP - General (Family Medicine) Marinus Mawaylor, Gregg W, MD as PCP - Cardiology (Cardiology) Meryl DareStark, Malcolm T, MD as Consulting Physician (Gastroenterology) Delora FuelJohnson, Brent, OD (Optometry) Best, Viviann SpareSteven, DMD (Oral Surgery) Durene Romanslin, Matthew, MD as Consulting Physician (Orthopedic Surgery) Dentist: Dr. Langston MaskerMorris   Depression Screening: Flowsheet Row Clinical Support from [facility]/17/2022 in [facility]  PHQ-2 Total Score 0        Falls screen:     02/17/2022   10:27 AM 08/12/2021    9:41 AM 02/03/2021    9:32 AM 08/03/2020    9:32 AM 11/01/2018    8:16 AM  Fall Risk   Falls in the past year? 0 0 0 1 0  Number falls in past yr: 0 0 0 0   Injury with Fall? 0 0 0 0   Comment    fell in house, hit the couch-no injury (more like a stumble)   Risk for fall due to : No Fall Risks No Fall Risks No Fall Risks    Follow up Falls evaluation completed Falls evaluation completed Falls evaluation completed       Functional Status Survey:          End of Life Discussion:  Patient does have a living will and medical power of attorney, scanned into his  chart.    PMH, PSH, SH and FH reviewed and updated    ROS: The patient denies anorexia, fever, headaches, weight changes, decreased hearing, ear pain, hoarseness, chest pain, palpitations, dizziness, syncope, dyspnea on exertion, cough, swelling, nausea, vomiting, diarrhea, constipation, abdominal pain, melena, hematochezia, indigestion/heartburn, hematuria, incontinence, erectile dysfunction, nocturia, weakened urine stream, dysuria, genital lesions, numbness, tingling, weakness, tremor, suspicious skin lesions, depression, anxiety, abnormal bleeding/bruising (just some bruising on forearms), or enlarged lymph nodes. Denies memory issues. Denies fatigue. +snoring, but refreshed in mornings, no daytime somnolence. L knee pain, tolerable (doesn't require  medication) Decreased vision R eye (known cataract). UPDATE   PHYSICAL EXAM:  There were no vitals taken for this visit.   Wt Readings from Last 3 Encounters:  02/17/22 217 lb (98.4 kg)  08/12/21 223 lb (101.2 kg)  06/22/21 223 lb (101.2 kg)     General Appearance:    Alert, cooperative, no  distress, appears stated age.  Head:    Normocephalic, without obvious abnormality, atraumatic     Eyes:    PERRL, conjunctiva/corneas clear, EOM's intact, fundi normal on the left, not visualized on the R due to cataract     Ears:    Normal TM's and external ear canals     Nose:    No drainage or sinus tenderness   Throat:    Normal mucosa  Neck:    Supple, no lymphadenopathy; thyroid: no enlargement/ tenderness/nodules; no carotid bruit or JVD     Back:    Spine nontender, no curvature, ROM normal, no CVA tenderness   Lungs:    Clear to auscultation bilaterally without wheezes, rales or ronchi; respirations unlabored.     Chest Wall:    No tenderness or deformity     Heart:    Regular rate and rhythm, S1 and S2 normal, 2/6 SE murmur, no rub or gallop.  Breast Exam:    No chest wall tenderness, masses or gynecomastia     Abdomen:    Soft,  non-tender, nondistended, normoactive bowel sounds,   no masses, no hepatosplenomegaly     Genitalia:    normal external genitalia, circumsized, no lesions. Testicles are normal, no masses, no hernias   Rectal:    Normal sphincter tone, no masses.  Heme negative stool. Prostate is normal, not enlarged, no nodule.  Extremities:    No clubbing, cyanosis or edema.    Pulses:    2+ and symmetric all extremities     Skin:    Skin color, texture, turgor normal, no rashes or lesions. He has just a few purpura on legs and arms, some small scrapes on his forearms, and some scrapes and ecchymoses on his legs.  Lymph nodes:    Cervical, supraclavicular, and inguinal nodes normal     Neurologic:    Normal strength, sensation; eflexes 2+ and symmetric throughout                         Psych:   Normal mood, affect, hygiene and grooming  ***UPDATE_-R cataract? Purpura on legs/arms? Ecchymosis--update skin 2/6 murmur, reg rhthm?  ASSESSMENT/PLAN:  COVID booster (unless he already got 2nd) Did he get Tdap from pharmacy?  If not, needs to.  Prevnar-20 at his next visit (will have been >5 years since last one by then.  No refills needed (all done feb/march x 90d)  PSA, c-met, CBC  Discussed PSA screening (risks/benefits), recommended at least 30 minutes of aerobic activity at least 5 days/week, weight-bearing exercise at least 2x/week; proper sunscreen use reviewed; healthy diet and alcohol recommendations (less than or equal to 2 drinks/day) reviewed; regular seatbelt use; changing batteries in smoke detectors. Immunization recommendations discussed--continue yearly flu shots. Tetanus booster due, to get from pharmacy.   COVID booster Plan to give prevnar-20 at next visit (when >5 yrs from last pneumonia vaccine). Colonoscopy recommendations reviewed--UTD.    MOST form reviewed, updated. Full Code, Full Care  F/u 6 mos (will need c-met, CBC, lipid, uric acid)  Lavonda Jumbo, MD   Medicare  Attestation I have personally reviewed: The patient's medical and social history Their use of alcohol, tobacco or illicit drugs Their current medications and supplements The patient's functional ability including ADLs,fall risks, home safety risks, cognitive, and hearing and visual impairment Diet and physical activities Evidence for depression or mood disorders   The patient's weight, height and BMI have been recorded in the  chart.  I have made referrals, counseling, and provided education to the patient based on review of the above and I have provided the patient with a written personalized care plan for preventive services.

## 2022-08-31 NOTE — Patient Instructions (Incomplete)
HEALTH MAINTENANCE RECOMMENDATIONS:  It is recommended that you get at least 30 minutes of aerobic exercise at least 5 days/week (for weight loss, you may need as much as 60-90 minutes). This can be any activity that gets your heart rate up. This can be divided in 10-15 minute intervals if needed, but try and build up your endurance at least once a week.  Weight bearing exercise is also recommended twice weekly.  Eat a healthy diet with lots of vegetables, fruits and fiber.  "Colorful" foods have a lot of vitamins (ie green vegetables, tomatoes, red peppers, etc).  Limit sweet tea, regular sodas and alcoholic beverages, all of which has a lot of calories and sugar.  Up to 2 alcoholic drinks daily may be beneficial for men (unless trying to lose weight, watch sugars).  Drink a lot of water.  Sunscreen of at least SPF 30 should be used on all sun-exposed parts of the skin when outside between the hours of 10 am and 4 pm (not just when at beach or pool, but even with exercise, golf, tennis, and yard work!)  Use a sunscreen that says "broad spectrum" so it covers both UVA and UVB rays, and make sure to reapply every 1-2 hours.  Remember to change the batteries in your smoke detectors when changing your clock times in the spring and fall.  Carbon monoxide detectors are recommended for your home.  Use your seat belt every time you are in a car, and please drive safely and not be distracted with cell phones and texting while driving.    Timothy Estes , Thank you for taking time to come for your Medicare Wellness Visit. I appreciate your ongoing commitment to your health goals. Please review the following plan we discussed and let me know if I can assist you in the future.   This is a list of the screening recommended for you and due dates:  Health Maintenance  Topic Date Due   DTaP/Tdap/Td vaccine (2 - Td or Tdap) 07/10/2022   Flu Shot  12/22/2022   Medicare Annual Wellness Visit  09/01/2023   Colon  Cancer Screening  11/11/2025   Pneumonia Vaccine  Completed   COVID-19 Vaccine  Completed   Hepatitis C Screening: USPSTF Recommendation to screen - Ages 18-79 yo.  Completed   Zoster (Shingles) Vaccine  Completed   HPV Vaccine  Aged Out   Get your tetanus booster (TdaP) from the pharmacy. You will be due for another COVID booster after 4/20.  You can get them the same day, or separate the vaccines by 2 weeks.  I recommend getting Prevnar-20, but not until 5 years from your last other pneumonia shots--this would be in mid-June.  You can get it at the pharmacy, or we can give it here at your next visit.  We discussed a trial of stopping the allopurinol, as your uric acid level was very low, on the lowest dose of medication.  As long as you don't have any alcohol, you likely will not have any further gout flares.  We will recheck the uric acid level and if it is >6, we will restart the 100 mg allopurinol daily.  Please try and work on losing weight, which will help with your overall health, and specifically your liver. Be sure to high fiber diet, whole grains, lots of fruits and vegetables.  Limit saturated fats such as red meats, cheese, butter, etc. Please cut back on carbs in your diet (portions and frequency)--including bread,  rice, pasta, potatoes   Try and keep your portions small, and limit sweets and caloric beverages (sodas, tea, lemonade, juices).  We discussed doing a trial on the lower dose of Niaspan, to see if that keeps your triglycerides at goal, and lowers the side effects.  Since you don't drink alcohol anymore, I suspect the triglycerides will be better.  Fried foods contribute to the triglycerides, as do sweets and other sugar in your diet, so try and keep these to a minimum.  If your numbers still look great, we may even be able to stop the medication!

## 2022-09-01 ENCOUNTER — Encounter: Payer: Self-pay | Admitting: Family Medicine

## 2022-09-01 ENCOUNTER — Ambulatory Visit: Payer: Medicare PPO | Admitting: Family Medicine

## 2022-09-01 VITALS — BP 132/78 | HR 60 | Ht 70.0 in | Wt 218.0 lb

## 2022-09-01 DIAGNOSIS — Z125 Encounter for screening for malignant neoplasm of prostate: Secondary | ICD-10-CM

## 2022-09-01 DIAGNOSIS — M109 Gout, unspecified: Secondary | ICD-10-CM | POA: Diagnosis not present

## 2022-09-01 DIAGNOSIS — Z5181 Encounter for therapeutic drug level monitoring: Secondary | ICD-10-CM

## 2022-09-01 DIAGNOSIS — E782 Mixed hyperlipidemia: Secondary | ICD-10-CM

## 2022-09-01 DIAGNOSIS — K760 Fatty (change of) liver, not elsewhere classified: Secondary | ICD-10-CM

## 2022-09-01 DIAGNOSIS — D6869 Other thrombophilia: Secondary | ICD-10-CM

## 2022-09-01 DIAGNOSIS — Z0000 Encounter for general adult medical examination without abnormal findings: Secondary | ICD-10-CM

## 2022-09-01 DIAGNOSIS — E559 Vitamin D deficiency, unspecified: Secondary | ICD-10-CM

## 2022-09-01 DIAGNOSIS — I480 Paroxysmal atrial fibrillation: Secondary | ICD-10-CM | POA: Diagnosis not present

## 2022-09-01 DIAGNOSIS — I10 Essential (primary) hypertension: Secondary | ICD-10-CM

## 2022-09-01 DIAGNOSIS — I2510 Atherosclerotic heart disease of native coronary artery without angina pectoris: Secondary | ICD-10-CM | POA: Diagnosis not present

## 2022-09-01 DIAGNOSIS — I4891 Unspecified atrial fibrillation: Secondary | ICD-10-CM

## 2022-09-01 DIAGNOSIS — Z7901 Long term (current) use of anticoagulants: Secondary | ICD-10-CM

## 2022-09-01 MED ORDER — NIACIN ER (ANTIHYPERLIPIDEMIC) 500 MG PO TBCR: 500.0000 mg | EXTENDED_RELEASE_TABLET | Freq: Every day | ORAL | 0 refills | Status: DC

## 2022-09-02 LAB — COMPREHENSIVE METABOLIC PANEL
ALT: 30 IU/L (ref 0–44)
AST: 51 IU/L — ABNORMAL HIGH (ref 0–40)
Albumin/Globulin Ratio: 1.3 (ref 1.2–2.2)
Albumin: 4.5 g/dL (ref 3.8–4.8)
Alkaline Phosphatase: 71 IU/L (ref 44–121)
BUN/Creatinine Ratio: 15 (ref 10–24)
BUN: 16 mg/dL (ref 8–27)
Bilirubin Total: 0.7 mg/dL (ref 0.0–1.2)
CO2: 23 mmol/L (ref 20–29)
Calcium: 9.9 mg/dL (ref 8.6–10.2)
Chloride: 103 mmol/L (ref 96–106)
Creatinine, Ser: 1.04 mg/dL (ref 0.76–1.27)
Globulin, Total: 3.5 g/dL (ref 1.5–4.5)
Glucose: 106 mg/dL — ABNORMAL HIGH (ref 70–99)
Potassium: 4.4 mmol/L (ref 3.5–5.2)
Sodium: 141 mmol/L (ref 134–144)
Total Protein: 8 g/dL (ref 6.0–8.5)
eGFR: 77 mL/min/{1.73_m2} (ref >59)

## 2022-09-02 LAB — CBC WITH DIFFERENTIAL/PLATELET
Basophils Absolute: 0.1 10*3/uL (ref 0.0–0.2)
Basos: 1 %
EOS (ABSOLUTE): 0.4 10*3/uL (ref 0.0–0.4)
Eos: 4 %
Hematocrit: 45.9 % (ref 37.5–51.0)
Hemoglobin: 15.1 g/dL (ref 13.0–17.7)
Immature Grans (Abs): 0 10*3/uL (ref 0.0–0.1)
Immature Granulocytes: 0 %
Lymphocytes Absolute: 2.8 10*3/uL (ref 0.7–3.1)
Lymphs: 31 %
MCH: 29 pg (ref 26.6–33.0)
MCHC: 32.9 g/dL (ref 31.5–35.7)
MCV: 88 fL (ref 79–97)
Monocytes Absolute: 1 10*3/uL — ABNORMAL HIGH (ref 0.1–0.9)
Monocytes: 11 %
Neutrophils Absolute: 4.8 10*3/uL (ref 1.4–7.0)
Neutrophils: 53 %
Platelets: 226 10*3/uL (ref 150–450)
RBC: 5.21 x10E6/uL (ref 4.14–5.80)
RDW: 13.5 % (ref 11.6–15.4)
WBC: 9.1 10*3/uL (ref 3.4–10.8)

## 2022-09-02 LAB — PSA: Prostate Specific Ag, Serum: 3.1 ng/mL (ref 0.0–4.0)

## 2022-10-14 DIAGNOSIS — M1712 Unilateral primary osteoarthritis, left knee: Secondary | ICD-10-CM | POA: Diagnosis not present

## 2022-10-14 DIAGNOSIS — M1711 Unilateral primary osteoarthritis, right knee: Secondary | ICD-10-CM | POA: Diagnosis not present

## 2022-10-22 ENCOUNTER — Other Ambulatory Visit: Payer: Self-pay | Admitting: Family Medicine

## 2022-10-24 ENCOUNTER — Other Ambulatory Visit: Payer: Self-pay | Admitting: Family Medicine

## 2022-10-24 DIAGNOSIS — E782 Mixed hyperlipidemia: Secondary | ICD-10-CM

## 2022-10-24 DIAGNOSIS — I2510 Atherosclerotic heart disease of native coronary artery without angina pectoris: Secondary | ICD-10-CM

## 2022-10-25 ENCOUNTER — Other Ambulatory Visit: Payer: Medicare PPO

## 2022-10-25 DIAGNOSIS — Z5181 Encounter for therapeutic drug level monitoring: Secondary | ICD-10-CM | POA: Diagnosis not present

## 2022-10-25 DIAGNOSIS — M109 Gout, unspecified: Secondary | ICD-10-CM

## 2022-10-25 DIAGNOSIS — E782 Mixed hyperlipidemia: Secondary | ICD-10-CM

## 2022-10-25 LAB — LIPID PANEL
Chol/HDL Ratio: 2.7 ratio (ref 0.0–5.0)
Cholesterol, Total: 140 mg/dL (ref 100–199)
LDL Chol Calc (NIH): 67 mg/dL (ref 0–99)
Triglycerides: 118 mg/dL (ref 0–149)

## 2022-10-25 LAB — URIC ACID

## 2022-10-26 LAB — LIPID PANEL
HDL: 52 mg/dL (ref >39)
VLDL Cholesterol Cal: 21 mg/dL (ref 5–40)

## 2022-11-01 NOTE — Progress Notes (Unsigned)
No chief complaint on file.  Patient presents for 2 month follow-up from his physical. At that time, we discussed a trial of stopping his allopurinol.  His uric acid was 3.7 when taking 100 mg of allopurinol daily.  He no longer drinks any alcohol. Since stopping allopurinol, he denies any gout flares.  Uric acid level was repeated prior to visit, and was 5.9.  Lab Results  Component Value Date   LABURIC 5.9 10/25/2022   Hyperlipidemia: He continues on rosuvastatin, zetia, and fish oil.  We decreased his niaspan dose from 1000 mg to 500 mg after his physical due to his labs being at goal, improved diet (with no further alcohol), and some flushing side effects.  Lab Results  Component Value Date   CHOL 140 10/25/2022   HDL 52 10/25/2022   LDLCALC 67 10/25/2022   TRIG 118 10/25/2022   CHOLHDL 2.7 10/25/2022     PMH, PSH, SH reviewed    ROS:  No fever, chills, URI or allergy symptoms, cough, shortness of breath. No heart burn, n/v/d or other bowel changes. No abdominal pain. Denies urinary concerns. No bleeding, no unexplained bruising. Moods are good, no depression, anxiety, insomnia. Denies joint pains. Flushing improved ?***    PHYSICAL EXAM:  There were no vitals taken for this visit.  Wt Readings from Last 3 Encounters:  09/01/22 218 lb (98.9 kg)  02/17/22 217 lb (98.4 kg)  08/12/21 223 lb (101.2 kg)   Pleasant male in good spirits.  HEENT: PERRL, EOMI, conjunctiva and sclera are clear.  Neck: no lymphadenopathy, thyromegaly or mass Heart: regular rate and rhythm, 2/6 SEM at LUSB Lungs: clear bilaterally Abdomen: soft, nontender, no organomegaly or mass Back: no spinal or CVA tenderness Extremities: no edema.   Neuro: alert and oriented Psych: normal mood, affect, hygiene and grooming    ASSESSMENT/PLAN:  He stopped allopurinol (can remove from list if no gout flares)  Did he get TdaP, COVID booster? Both were recommended at his CPE 2 mos  ago.  Prevnar-20  today  ?trial off niaspan?? Vs refill the 500mg  dose

## 2022-11-02 ENCOUNTER — Ambulatory Visit: Payer: Medicare PPO | Admitting: Family Medicine

## 2022-11-02 ENCOUNTER — Encounter: Payer: Medicare PPO | Admitting: Family Medicine

## 2022-11-02 ENCOUNTER — Encounter: Payer: Self-pay | Admitting: Family Medicine

## 2022-11-02 VITALS — BP 136/80 | HR 68 | Ht 70.0 in | Wt 219.2 lb

## 2022-11-02 DIAGNOSIS — I2510 Atherosclerotic heart disease of native coronary artery without angina pectoris: Secondary | ICD-10-CM

## 2022-11-02 DIAGNOSIS — I10 Essential (primary) hypertension: Secondary | ICD-10-CM

## 2022-11-02 DIAGNOSIS — E782 Mixed hyperlipidemia: Secondary | ICD-10-CM

## 2022-11-02 DIAGNOSIS — Z5181 Encounter for therapeutic drug level monitoring: Secondary | ICD-10-CM

## 2022-11-02 DIAGNOSIS — Z23 Encounter for immunization: Secondary | ICD-10-CM | POA: Diagnosis not present

## 2022-11-02 DIAGNOSIS — M109 Gout, unspecified: Secondary | ICD-10-CM

## 2022-11-02 MED ORDER — METOPROLOL TARTRATE 25 MG PO TABS: 25.0000 mg | ORAL_TABLET | Freq: Two times a day (BID) | ORAL | 1 refills | Status: DC

## 2022-11-02 MED ORDER — ROSUVASTATIN CALCIUM 40 MG PO TABS: 40.0000 mg | ORAL_TABLET | Freq: Every day | ORAL | 1 refills | Status: DC

## 2022-11-02 NOTE — Assessment & Plan Note (Signed)
Lipids at goal, on lower dose Niaspan. SE resolved with lower dose.  Discussed trial of stopping Niaspan entirely, but pt prefers to continue at 500mg  dose for now. Cont rosuvastatin and fish oil, and lowfat, low cholesterol diet.

## 2022-11-02 NOTE — Patient Instructions (Addendum)
You were given Prevnar-20 today. Wait 2 weeks to get your Tdap (tetanus booster) from the pharmacy. COVID booster is recommended--either get one soon (and then another one 4 months later, if they come out with a new one in the fall, vs waiting until the new one comes out).  Continue to follow a low purine diet in order to prevent gout flares. You can remain OFF of the allopurinol.  If you start to have any recurrences of gout, we can restart it (but not during the flare).  We discussed the possibility of stopping the niaspan entirely.  You prefer to stay on the lower dose at this time, especially since the flushing side effects have resolved.  We can readdress this in the future if your lipids remain wonderful.

## 2022-11-02 NOTE — Assessment & Plan Note (Signed)
Asymptomatic. Cont BB, statin

## 2022-11-02 NOTE — Assessment & Plan Note (Signed)
BP borderline today, but pain is a factor. Cont current meds, low Na diet. Planning TKR soon

## 2022-11-02 NOTE — Assessment & Plan Note (Signed)
No recurrent gout and uric acid level remains <6, off allopurinol. Cont low purine diet, avoiding alcohol.  Restart 100 mg allopurinol if issues return

## 2022-11-02 NOTE — Addendum Note (Signed)
Addended by: Joselyn Arrow on: 11/02/2022 08:14 PM   Modules accepted: Level of Service

## 2022-11-24 ENCOUNTER — Other Ambulatory Visit: Payer: Self-pay | Admitting: Family Medicine

## 2022-11-24 DIAGNOSIS — E782 Mixed hyperlipidemia: Secondary | ICD-10-CM

## 2022-11-30 ENCOUNTER — Telehealth: Payer: Self-pay | Admitting: *Deleted

## 2022-11-30 NOTE — Telephone Encounter (Signed)
   Pre-operative Risk Assessment    Patient Name: Timothy Estes  DOB: 08-17-1950 MRN: [id number]      Request for Surgical Clearance    Procedure:   LEFT TOTAL KNEE ARTHROPLASTY  Date of Surgery:  Clearance 01/10/23                                 Surgeon:  DR. MATTHEW OLIN Surgeon's Group or Practice Name:  [facility] Phone number:  [phone] ATTN: [name] Fax number:  [phone]   Type of Clearance Requested:   - Medical  - Pharmacy:  Hold Aspirin and Rivaroxaban (Xarelto)     Type of Anesthesia:  Spinal   Additional requests/questions:    [name]   11/30/2022, 10:44 AM

## 2022-12-01 ENCOUNTER — Telehealth: Payer: Self-pay | Admitting: *Deleted

## 2022-12-01 NOTE — Telephone Encounter (Signed)
PATIENT SCHEDULED 12-16-22 CALL [phone] MED REC AND CONSENT .Marland KitchenSMO

## 2022-12-01 NOTE — Telephone Encounter (Signed)
   Name: Timothy Estes  DOB: April 26, 1951  MRN: [id number]  Primary Cardiologist: Lewayne Bunting, MD   Preoperative team, please contact this patient and set up a phone call appointment for further preoperative risk assessment. Please obtain consent and complete medication review. Thank you for your help.  I confirm that guidance regarding antiplatelet and oral anticoagulation therapy has been completed and, if necessary, noted below.   Marcelino Duster, PA 12/01/2022, 12:04 PM [facility]

## 2022-12-01 NOTE — Telephone Encounter (Signed)
  Patient Consent for Virtual Visit  CALL [phone] MED REC AND CONSENT .Marland KitchenSMO       Timothy Estes declined to provide consent on 12/01/2022 for a virtual visit.  An in person visit will be arranged.    CONSENT FOR VIRTUAL VISIT FOR:  Timothy Estes  By participating in this virtual visit I agree to the following:  I hereby voluntarily request, consent and authorize [facility] and its employed or contracted physicians, physician assistants, nurse practitioners or other licensed health care professionals (the Practitioner), to provide me with telemedicine health care services (the "Services") as deemed necessary by the treating Practitioner. I acknowledge and consent to receive the Services by the Practitioner via telemedicine. I understand that the telemedicine visit will involve communicating with the Practitioner through live audiovisual communication technology and the disclosure of certain medical information by electronic transmission. I acknowledge that I have been given the opportunity to request an in-person assessment or other available alternative prior to the telemedicine visit and am voluntarily participating in the telemedicine visit.  I understand that I have the right to withhold or withdraw my consent to the use of telemedicine in the course of my care at any time, without affecting my right to future care or treatment, and that the Practitioner or I may terminate the telemedicine visit at any time. I understand that I have the right to inspect all information obtained and/or recorded in the course of the telemedicine visit and may receive copies of available information for a reasonable fee.  I understand that some of the potential risks of receiving the Services via telemedicine include:  Delay or interruption in medical evaluation due to technological equipment failure or disruption; Information transmitted may not be sufficient (e.g. poor resolution of images) to allow for  appropriate medical decision making by the Practitioner; and/or  In rare instances, security protocols could fail, causing a breach of personal health information.  Furthermore, I acknowledge that it is my responsibility to provide information about my medical history, conditions and care that is complete and accurate to the best of my ability. I acknowledge that Practitioner's advice, recommendations, and/or decision may be based on factors not within their control, such as incomplete or inaccurate data provided by me or distortions of diagnostic images or specimens that may result from electronic transmissions. I understand that the practice of medicine is not an exact science and that Practitioner makes no warranties or guarantees regarding treatment outcomes. I acknowledge that a copy of this consent can be made available to me via my patient portal [facility] MyChart), or I can request a printed copy by calling the office of [facility].    I understand that my insurance will be billed for this visit.   I have read or had this consent read to me. I understand the contents of this consent, which adequately explains the benefits and risks of the Services being provided via telemedicine.  I have been provided ample opportunity to ask questions regarding this consent and the Services and have had my questions answered to my satisfaction. I give my informed consent for the services to be provided through the use of telemedicine in my medical care

## 2022-12-01 NOTE — Telephone Encounter (Signed)
Patient with diagnosis of atrial fibrillation on Xarelto for anticoagulation.    Procedure: Left total knee arthroplasty Date of procedure: 01/10/23   CHA2DS2-VASc Score = 3   This indicates a 3.2% annual risk of stroke. The patient's score is based upon: CHF History: 0 HTN History: 1 Diabetes History: 0 Stroke History: 0 Vascular Disease History: 1 Age Score: 1 Gender Score: 0    CrCl 92 Platelet count 226  Per office protocol, patient can hold Xarelto for 3 days prior to procedure.   Patient will not need bridging with Lovenox (enoxaparin) around procedure.  **This guidance is not considered finalized until pre-operative APP has relayed final recommendations.**

## 2022-12-15 NOTE — Telephone Encounter (Signed)
Unfortunately, this patient will require an in office visit.He was last seen by Dr. Ladona Ridgel in January of 2023 and that was a virtual visit.  Procedure is scheduled for January 10, 2023.    Thank you, Samara Deist

## 2022-12-15 NOTE — Progress Notes (Deleted)
Virtual Visit via Telephone Note   Because of [name]'s co-morbid illnesses, he is at least at moderate risk for complications without adequate follow up.  This format is felt to be most appropriate for this patient at this time.  The patient did not have access to video technology/had technical difficulties with video requiring transitioning to audio format only (telephone).  All issues noted in this document were discussed and addressed.  No physical exam could be performed with this format.  Please refer to the patient's chart for his consent to telehealth for [facility].  Evaluation Performed:  Preoperative cardiovascular risk assessment _____________   Date:  12/15/2022   Patient ID:  Timothy Estes, DOB [birth year], MRN [id number] Patient Location:  Home Provider location:   Office  Primary Care Provider:  Joselyn Arrow, MD Primary Cardiologist:  Timothy Bunting, MD  Chief Complaint / Patient Profile   72 y.o. y/o male with a h/o *** who is pending *** and presents today for telephonic preoperative cardiovascular risk assessment.  History of Present Illness    Timothy Estes is a 72 y.o. male who presents via audio/video conferencing for a telehealth visit today.  Pt was last seen in cardiology clinic on *** by ***.  At that time Timothy Estes was doing well ***.  The patient is now pending procedure as outlined above. Since his last visit, he ***  Past Medical History    Past Medical History:  Diagnosis Date   Anemia    Arthritis    Atrial fibrillation with RVR (HCC)    converted to NSR with IV dilt 10/12 - no coumadin due to need for Plavix and ASA   Cataract    Clotting disorder (HCC)    PE 2012   Colon polyps  10/2015   (non-adenomatous)   Coronary artery disease    LHC 03/17/11: Dx 30%, OM1 30%, OM2 30%, pRCA 30%, EF 40-45% with anterolat and apical HK   Diverticulosis  10/2015   noted on colonoscopy   Elevated LFTs    Fatty liver    Hematochezia    LIKEY FROM  HEMORRHOIDAL BLEEDING; PT HAS H/O   Hyperlipidemia    Hypertension    Internal hemorrhoids  10/2015   noted on colonoscopy   NSTEMI (non-ST elevated myocardial infarction) (HCC)    2012-FLET THAT NON-ST-ELEVATION MI IS POSSIBLY SECONDARY TO CORONARY EMBOLUS FROM HIS AFIB   Syncope    in setting of AFib with RVR   Past Surgical History:  Procedure Laterality Date   CARDIAC CATHETERIZATION  03/18/11   cardiac event monitor  04/01/11   CATARACT EXTRACTION W/PHACO Left 09/18/2014   Procedure: CATARACT EXTRACTION PHACO AND INTRAOCULAR LENS PLACEMENT LEFT EYE;  Surgeon: Gemma Payor, MD;  Location: [facility];  Service: Ophthalmology;  Laterality: Left;  CDE 13.32   COLONOSCOPY     ESOPHAGOGASTRODUODENOSCOPY ENDOSCOPY     MOUTH SURGERY     TOTAL HIP ARTHROPLASTY Left 07/10/2018   Procedure: TOTAL HIP ARTHROPLASTY ANTERIOR APPROACH;  Surgeon: Durene Romans, MD;  Location: [facility];  Service: Orthopedics;  Laterality: Left;  70 mins   TRANSTHORACIC ECHOCARDIOGRAM  03/19/11   WISDOM TOOTH EXTRACTION      Allergies  No Known Allergies  Home Medications    Prior to Admission medications   Medication Sig Start Date End Date Taking? Authorizing Provider  aspirin 81 MG EC tablet Take 81 mg by mouth daily. Swallow whole.    [provider]  Coenzyme Q-10 100  MG capsule Take 1 tablet by mouth daily.     [provider]  ezetimibe (ZETIA) 10 MG tablet TAKE 1 TABLET BY MOUTH EVERY DAY 10/24/22   Timothy Arrow, MD  fish oil-omega-3 fatty acids 1000 MG capsule Take 2 g by mouth 2 (two) times daily.     [provider]  metoprolol tartrate (LOPRESSOR) 25 MG tablet Take 1 tablet (25 mg total) by mouth 2 (two) times daily. 11/02/22   Timothy Arrow, MD  niacin (VITAMIN B3) 500 MG ER tablet TAKE 1 TABLET BY MOUTH AT BEDTIME. 11/25/22   Timothy Arrow, MD  nitroGLYCERIN (NITROSTAT) 0.4 MG SL tablet Place 1 tablet (0.4 mg total) under the tongue every 5 (five) minutes as needed for chest pain. Patient  not taking: Reported on 08/12/2021 02/03/21   Timothy Arrow, MD  rosuvastatin (CRESTOR) 40 MG tablet Take 1 tablet (40 mg total) by mouth daily. 11/02/22   Timothy Arrow, MD  VITAMIN D PO Take 1,000 Int'l Units by mouth daily.    [provider]  XARELTO 20 MG TABS tablet TAKE 1 TABLET BY MOUTH DAILY WITH SUPPER 07/18/22   Marinus Maw, MD    Physical Exam    Vital Signs:  Timothy Estes does not have vital signs available for review today.***  Given telephonic nature of communication, physical exam is limited. AAOx3. NAD. Normal affect.  Speech and respirations are unlabored.  Accessory Clinical Findings    None  Assessment & Plan    1.  Preoperative Cardiovascular Risk Assessment:  The patient was advised that if he develops new symptoms prior to surgery to contact our office to arrange for a follow-up visit, and he verbalized understanding.  According to the Revised Cardiac Risk Index (RCRI), his    His   according to the Duke Activity Status Index (DASI).   Therefore, based on ACC/AHA guidelines, patient would be at acceptable risk for the planned procedure without further cardiovascular testing. I will route this recommendation to the requesting party via Epic fax function.   Per Pharmacy: Per office protocol, patient can hold Xarelto for 3 days prior to procedure.   Patient will not need bridging with Lovenox (enoxaparin) around procedure.  A copy of this note will be routed to requesting surgeon.  Time:   Today, I have spent *** minutes with the patient with telehealth technology discussing medical history, symptoms, and management plan.     Joni Reining, NP  12/15/2022, 8:47 AM

## 2022-12-15 NOTE — Telephone Encounter (Signed)
Per pre op APP today, pt needs IN OFFICE appt as last seen 05/2021 with Dr. Ladona Ridgel. Pt is agreeable to plan of care for in office appt. We have cancelled the tele pre op appt for 12/16/22. Pt will see Dr. Ladona Ridgel in [facility] 12/22/22 @ 3:30. Pt thanked me for the help and the call.

## 2022-12-16 ENCOUNTER — Ambulatory Visit: Payer: Medicare PPO

## 2022-12-21 NOTE — Progress Notes (Signed)
Surgery orders requested via Epic inbox. °

## 2022-12-22 ENCOUNTER — Ambulatory Visit: Payer: Medicare PPO | Attending: Internal Medicine | Admitting: Internal Medicine

## 2022-12-22 ENCOUNTER — Ambulatory Visit: Payer: Medicare PPO | Admitting: Internal Medicine

## 2022-12-22 VITALS — BP 178/90 | HR 62 | Ht 70.0 in | Wt 220.0 lb

## 2022-12-22 DIAGNOSIS — I2510 Atherosclerotic heart disease of native coronary artery without angina pectoris: Secondary | ICD-10-CM

## 2022-12-22 DIAGNOSIS — I10 Essential (primary) hypertension: Secondary | ICD-10-CM | POA: Diagnosis not present

## 2022-12-22 DIAGNOSIS — I4891 Unspecified atrial fibrillation: Secondary | ICD-10-CM | POA: Diagnosis not present

## 2022-12-22 MED ORDER — NITROGLYCERIN 0.4 MG SL SUBL
0.4000 mg | SUBLINGUAL_TABLET | SUBLINGUAL | 6 refills | Status: DC | PRN
Start: 2022-12-22 — End: 2024-03-22

## 2022-12-22 NOTE — Patient Instructions (Signed)

## 2022-12-22 NOTE — Progress Notes (Signed)
HPI Timothy Estes returns today for followup. He is a pleasant 72 yo man with a h/o HTN, PAF, and CAD. He has undergone hip replacement without difficulty. He has not had chest pain. No edema. No sob. He checks his bp at home and it runs in the 120's on medical therapy.  He notes his SBP was 125 last night.  No Known Allergies   Current Outpatient Medications  Medication Sig Dispense Refill   aspirin 81 MG EC tablet Take 81 mg by mouth daily. Swallow whole.     Coenzyme Q-10 100 MG capsule Take 1 tablet by mouth daily.      ezetimibe (ZETIA) 10 MG tablet TAKE 1 TABLET BY MOUTH EVERY DAY 90 tablet 0   fish oil-omega-3 fatty acids 1000 MG capsule Take 2 g by mouth 2 (two) times daily.      metoprolol tartrate (LOPRESSOR) 25 MG tablet Take 1 tablet (25 mg total) by mouth 2 (two) times daily. 180 tablet 1   niacin (VITAMIN B3) 500 MG ER tablet TAKE 1 TABLET BY MOUTH AT BEDTIME. 90 tablet 0   nitroGLYCERIN (NITROSTAT) 0.4 MG SL tablet Place 1 tablet (0.4 mg total) under the tongue every 5 (five) minutes as needed for chest pain. 28 tablet 1   rosuvastatin (CRESTOR) 40 MG tablet Take 1 tablet (40 mg total) by mouth daily. 90 tablet 1   VITAMIN D PO Take 1,000 Int'l Units by mouth daily.     XARELTO 20 MG TABS tablet TAKE 1 TABLET BY MOUTH DAILY WITH SUPPER 90 tablet 1   No current facility-administered medications for this visit.     Past Medical History:  Diagnosis Date   Anemia    Arthritis    Atrial fibrillation with RVR (HCC)    converted to NSR with IV dilt 10/12 - no coumadin due to need for Plavix and ASA   Cataract    Clotting disorder (HCC)    PE 2012   Colon polyps  10/2015   (non-adenomatous)   Coronary artery disease    LHC 03/17/11: Dx 30%, OM1 30%, OM2 30%, pRCA 30%, EF 40-45% with anterolat and apical HK   Diverticulosis  10/2015   noted on colonoscopy   Elevated LFTs    Fatty liver    Hematochezia    LIKEY FROM HEMORRHOIDAL BLEEDING; PT HAS H/O   Hyperlipidemia     Hypertension    Internal hemorrhoids  10/2015   noted on colonoscopy   NSTEMI (non-ST elevated myocardial infarction) (HCC)    2012-FLET THAT NON-ST-ELEVATION MI IS POSSIBLY SECONDARY TO CORONARY EMBOLUS FROM HIS AFIB   Syncope    in setting of AFib with RVR    ROS:   All systems reviewed and negative except as noted in the HPI.   Past Surgical History:  Procedure Laterality Date   CARDIAC CATHETERIZATION  03/18/11   cardiac event monitor  04/01/11   CATARACT EXTRACTION W/PHACO Left 09/18/2014   Procedure: CATARACT EXTRACTION PHACO AND INTRAOCULAR LENS PLACEMENT LEFT EYE;  Surgeon: Gemma Payor, MD;  Location: [facility];  Service: Ophthalmology;  Laterality: Left;  CDE 13.32   COLONOSCOPY     ESOPHAGOGASTRODUODENOSCOPY ENDOSCOPY     MOUTH SURGERY     TOTAL HIP ARTHROPLASTY Left 07/10/2018   Procedure: TOTAL HIP ARTHROPLASTY ANTERIOR APPROACH;  Surgeon: Durene Romans, MD;  Location: [facility];  Service: Orthopedics;  Laterality: Left;  70 mins   TRANSTHORACIC ECHOCARDIOGRAM  03/19/11   WISDOM TOOTH  EXTRACTION       Family History  Problem Relation Age of Onset   Hyperlipidemia Brother    Hypertension Brother    Arthritis Mother    Heart disease Neg Hx    Cancer Neg Hx    Diabetes Neg Hx    Colon cancer Neg Hx    Esophageal cancer Neg Hx    Rectal cancer Neg Hx    Stomach cancer Neg Hx      Social History   Socioeconomic History   Marital status: Married    Spouse name: Not on file   Number of children: 0   Years of education: Not on file   Highest education level: Not on file  Occupational History   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  He lost his job 02/2013;  Previously did survey work part-time (on the side)   Rescued a Daschund-Beagle mix      Updated 08/2022   Social Determinants of Health   Financial Resource Strain: Low Risk  (11/02/2022)   Overall Financial Resource Strain (CARDIA)    Difficulty of Paying Living Expenses: Not hard at all  Food Insecurity: No Food Insecurity (11/02/2022)   Hunger Vital Sign    Worried About Running Out of Food in the Last Year: Never true    Ran Out of Food in the Last Year: Never true  Transportation Needs: No Transportation Needs (11/02/2022)   PRAPARE - Administrator, Civil Service (Medical): No    Lack of Transportation (Non-Medical): No  Physical Activity: Not on file  Stress: No Stress Concern Present (11/02/2022)   [employer] of Occupational Health - Occupational Stress Questionnaire    Feeling of Stress : Not at all  Social Connections: Moderately Integrated (11/02/2022)   Social Connection and Isolation Panel [NHANES]    Frequency of Communication with Friends and Family: Three times a week    Frequency of Social Gatherings with Friends and Family: Three times a week    Attends Religious Services: More than 4 times per year    Active Member of Clubs or Organizations: No    Attends Banker Meetings: Never    Marital Status: Married  Catering manager Violence: Not At Risk (11/02/2022)   Humiliation, Afraid, Rape, and Kick questionnaire    Fear of Current or Ex-Partner: No    Emotionally Abused: No    Physically Abused: No    Sexually Abused: No     BP (!) 178/90 (BP Location: Left Arm, Patient Position: Sitting, Cuff Size: Normal)   Pulse 62   Ht 5\' 10"  (1.778 m)   Wt 220 lb (99.8 kg)   SpO2 95%   BMI 31.57 kg/m    Physical Exam:  Well appearing NAD HEENT: Unremarkable Neck:  No JVD, no thyromegally Lymphatics:  No adenopathy Back:  No CVA tenderness Lungs:  Clear with no wheezes HEART:  Regular rate rhythm, no murmurs, no rubs, no clicks Abd:  soft, positive bowel sounds, no organomegally, no rebound, no guarding Ext:  2 plus pulses, no edema, no cyanosis, no clubbing Skin:  No rashes no nodules Neuro:  CN II through XII intact, motor grossly intact   Assess/Plan: 1. PAF - he has been asymptomatic since his last visit. He will continue Xarelto and beta blocker. 2. CAD - he has been more sedentary. He is encouraged to increase his physical activity. 3. HTN - his bp is high today but he was in a hurry coming in and he notes that when he checks his bp at home it runs in the 120-130 range. 4. Coags - he has not had any bleeding. Continue Xarelto. 5. Preop eval - he is pending left knee replacement. Prior to his severe knee pain a few months ago, he was able to walk up a steep incline without sob or chest pain. He may proceed with knee replacement surgery. He is an acceptable surgical risk. He can stop his Xarelto 3-4 days before the surgery and restart when the bleeding risk is acceptable. The half life of xarelto is 8 hours.    Leonia Reeves.D.

## 2022-12-26 ENCOUNTER — Ambulatory Visit: Payer: Medicare PPO | Admitting: Physical Therapy

## 2022-12-27 NOTE — Progress Notes (Addendum)
Anesthesia Review:  PCP: Elesa Massed LOV 09/01/22 clearance 12/01/22 on chart  Cardiologist : Lewayne Bunting LOV 12/22/22  Chest x-ray : EKG : 12/22/22  Echo : Stress test: Cardiac Cath :  Activity level: can do a flight of stairs without difficutly  Sleep Study/ CPAP : none  Fasting Blood Sugar :      / Checks Blood Sugar -- times a day:   Blood Thinner/ Instructions /Last Dose: ASA / Instructions/ Last Dose :    81 mg aspirin  Xarelto-  stop 3 days prior per pt    Blood pressure at preop appt on 12/28/22 was 194/95 in right arm and in left arm was 160/104.  PT denies any chest pain, shortness of breath, dizziness, headache or blurred vision.  Wife with pt at preop.  PT states " I am as nervous as a cat".  PT stated he took am meds at 0715am.  PT also states my blood pressure is always up when going to doctor or hospital.  PT shows me on phone where he keeps blood pressure daily checks at home and shows preop nurse readings of 121/70, and 120/80.  Instructed pt to continue to monitor blood pressure at home and if is elevated to please contact MD. Prior to surgery.  PT voiced understanding.

## 2022-12-27 NOTE — Patient Instructions (Signed)
SURGICAL WAITING ROOM VISITATION  Patients having surgery or a procedure may have no more than 2 support people in the waiting area - these visitors may rotate.    Children under the age of 15 must have an adult with them who is not the patient.  Due to an increase in RSV and influenza rates and associated hospitalizations, children ages 58 and under may not visit patients in [facility] hospitals.  If the patient needs to stay at the hospital during part of their recovery, the visitor guidelines for inpatient rooms apply. Pre-op nurse will coordinate an appropriate time for 1 support person to accompany patient in pre-op.  This support person may not rotate.    Please refer to the [facility] website for the visitor guidelines for Inpatients (after your surgery is over and you are in a regular room).       Your procedure is scheduled on:  01/10/2023    Report to [facility] Main Entrance    Report to admitting at   1200 noon    Call this number if you have problems the morning of surgery [phone]   Do not eat food :After Midnight.   After Midnight you may have the following liquids until _ 1115_____ AM  DAY OF SURGERY  Water Non-Citrus Juices (without pulp, NO RED-Apple, White grape, White cranberry) Black Coffee (NO MILK/CREAM OR CREAMERS, sugar ok)  Clear Tea (NO MILK/CREAM OR CREAMERS, sugar ok) regular and decaf                             Plain Jell-O (NO RED)                                           Fruit ices (not with fruit pulp, NO RED)                                     Popsicles (NO RED)                                                               Sports drinks like Gatorade (NO RED)                     The day of surgery:  Drink ONE (1) Pre-Surgery Clear Ensure or G2 at  1115 AM ( have completed by )  the morning of surgery. Drink in one sitting. Do not sip.  This drink was given to you during your hospital  pre-op appointment visit. Nothing else  to drink after completing the  Pre-Surgery Clear Ensure or G2.          If you have questions, please contact your surgeon's office.   FOLLOW BOWEL PREP AND ANY ADDITIONAL PRE OP INSTRUCTIONS YOU RECEIVED FROM YOUR SURGEON'S OFFICE!!!     Oral Hygiene is also important to reduce your risk of infection.  Remember - BRUSH YOUR TEETH THE MORNING OF SURGERY WITH YOUR REGULAR TOOTHPASTE  DENTURES WILL BE REMOVED PRIOR TO SURGERY PLEASE DO NOT APPLY "Poly grip" OR ADHESIVES!!!   Do NOT smoke after Midnight   Stop all vitamins and herbal supplements 7 days before surgery.   Take these medicines the morning of surgery with A SIP OF WATER:  none   DO NOT TAKE ANY ORAL DIABETIC MEDICATIONS DAY OF YOUR SURGERY  Bring CPAP mask and tubing day of surgery.                              You may not have any metal on your body including hair pins, jewelry, and body piercing             Do not wear make-up, lotions, powders, perfumes/cologne, or deodorant  Do not wear nail polish including gel and S&S, artificial/acrylic nails, or any other type of covering on natural nails including finger and toenails. If you have artificial nails, gel coating, etc. that needs to be removed by a nail salon please have this removed prior to surgery or surgery may need to be canceled/ delayed if the surgeon/ anesthesia feels like they are unable to be safely monitored.   Do not shave  48 hours prior to surgery.               Men may shave face and neck.   Do not bring valuables to the hospital. [facility] IS NOT             RESPONSIBLE   FOR VALUABLES.   Contacts, glasses, dentures or bridgework may not be worn into surgery.   Bring small overnight bag day of surgery.   DO NOT BRING YOUR HOME MEDICATIONS TO THE HOSPITAL. PHARMACY WILL DISPENSE MEDICATIONS LISTED ON YOUR MEDICATION LIST TO YOU DURING YOUR ADMISSION IN THE HOSPITAL!    Patients discharged on the day of  surgery will not be allowed to drive home.  Someone NEEDS to stay with you for the first 24 hours after anesthesia.   Special Instructions: Bring a copy of your healthcare power of attorney and living will documents the day of surgery if you haven't scanned them before.              Please read over the following fact sheets you were given: IF YOU HAVE QUESTIONS ABOUT YOUR PRE-OP INSTRUCTIONS PLEASE CALL [phone]   If you received a COVID test during your pre-op visit  it is requested that you wear a mask when out in public, stay away from anyone that may not be feeling well and notify your surgeon if you develop symptoms. If you test positive for Covid or have been in contact with anyone that has tested positive in the last 10 days please notify you surgeon.      Pre-operative 5 CHG Bath Instructions   You can play a key role in reducing the risk of infection after surgery. Your skin needs to be as free of germs as possible. You can reduce the number of germs on your skin by washing with CHG (chlorhexidine gluconate) soap before surgery. CHG is an antiseptic soap that kills germs and continues to kill germs even after washing.   DO NOT use if you have an allergy to chlorhexidine/CHG or antibacterial soaps. If your skin becomes reddened or irritated, stop using the CHG and notify one of our RNs at  [phone].   Please shower with the CHG soap starting 4 days before surgery using the following schedule:     Please keep in mind the following:  DO NOT shave, including legs and underarms, starting the day of your first shower.   You may shave your face at any point before/day of surgery.  Place clean sheets on your bed the day you start using CHG soap. Use a clean washcloth (not used since being washed) for each shower. DO NOT sleep with pets once you start using the CHG.   CHG Shower Instructions:  If you choose to wash your hair and private area, wash first with your normal  shampoo/soap.  After you use shampoo/soap, rinse your hair and body thoroughly to remove shampoo/soap residue.  Turn the water OFF and apply about 3 tablespoons (45 ml) of CHG soap to a CLEAN washcloth.  Apply CHG soap ONLY FROM YOUR NECK DOWN TO YOUR TOES (washing for 3-5 minutes)  DO NOT use CHG soap on face, private areas, open wounds, or sores.  Pay special attention to the area where your surgery is being performed.  If you are having back surgery, having someone wash your back for you may be helpful. Wait 2 minutes after CHG soap is applied, then you may rinse off the CHG soap.  Pat dry with a clean towel  Put on clean clothes/pajamas   If you choose to wear lotion, please use ONLY the CHG-compatible lotions on the back of this paper.     Additional instructions for the day of surgery: DO NOT APPLY any lotions, deodorants, cologne, or perfumes.   Put on clean/comfortable clothes.  Brush your teeth.  Ask your nurse before applying any prescription medications to the skin.      CHG Compatible Lotions   Aveeno Moisturizing lotion  Cetaphil Moisturizing Cream  Cetaphil Moisturizing Lotion  Clairol Herbal Essence Moisturizing Lotion, Dry Skin  Clairol Herbal Essence Moisturizing Lotion, Extra Dry Skin  Clairol Herbal Essence Moisturizing Lotion, Normal Skin  Curel Age Defying Therapeutic Moisturizing Lotion with Alpha Hydroxy  Curel Extreme Care Body Lotion  Curel Soothing Hands Moisturizing Hand Lotion  Curel Therapeutic Moisturizing Cream, Fragrance-Free  Curel Therapeutic Moisturizing Lotion, Fragrance-Free  Curel Therapeutic Moisturizing Lotion, Original Formula  Eucerin Daily Replenishing Lotion  Eucerin Dry Skin Therapy Plus Alpha Hydroxy Crme  Eucerin Dry Skin Therapy Plus Alpha Hydroxy Lotion  Eucerin Original Crme  Eucerin Original Lotion  Eucerin Plus Crme Eucerin Plus Lotion  Eucerin TriLipid Replenishing Lotion  Keri Anti-Bacterial Hand Lotion  Keri Deep  Conditioning Original Lotion Dry Skin Formula Softly Scented  Keri Deep Conditioning Original Lotion, Fragrance Free Sensitive Skin Formula  Keri Lotion Fast Absorbing Fragrance Free Sensitive Skin Formula  Keri Lotion Fast Absorbing Softly Scented Dry Skin Formula  Keri Original Lotion  Keri Skin Renewal Lotion Keri Silky Smooth Lotion  Keri Silky Smooth Sensitive Skin Lotion  Nivea Body Creamy Conditioning Oil  Nivea Body Extra Enriched Teacher, adult education Moisturizing Lotion Nivea Crme  Nivea Skin Firming Lotion  NutraDerm 30 Skin Lotion  NutraDerm Skin Lotion  NutraDerm Therapeutic Skin Cream  NutraDerm Therapeutic Skin Lotion  ProShield Protective Hand Cream  Provon moisturizing lotion

## 2022-12-28 ENCOUNTER — Encounter (HOSPITAL_COMMUNITY): Payer: Self-pay

## 2022-12-28 ENCOUNTER — Other Ambulatory Visit: Payer: Self-pay

## 2022-12-28 ENCOUNTER — Encounter (HOSPITAL_COMMUNITY)
Admission: RE | Admit: 2022-12-28 | Discharge: 2022-12-28 | Disposition: A | Discharge status: Home / Self Care | Payer: Medicare PPO | Source: Ambulatory Visit | Attending: Orthopedic Surgery | Admitting: Orthopedic Surgery

## 2022-12-28 VITALS — BP 194/95 | HR 55 | Temp 97.9°F | Resp 16 | Ht 70.0 in | Wt 212.0 lb

## 2022-12-28 DIAGNOSIS — M1712 Unilateral primary osteoarthritis, left knee: Secondary | ICD-10-CM | POA: Diagnosis not present

## 2022-12-28 DIAGNOSIS — Z01812 Encounter for preprocedural laboratory examination: Secondary | ICD-10-CM | POA: Insufficient documentation

## 2022-12-28 DIAGNOSIS — M25562 Pain in left knee: Secondary | ICD-10-CM | POA: Diagnosis not present

## 2022-12-28 DIAGNOSIS — Z7901 Long term (current) use of anticoagulants: Secondary | ICD-10-CM | POA: Diagnosis not present

## 2022-12-28 DIAGNOSIS — M25662 Stiffness of left knee, not elsewhere classified: Secondary | ICD-10-CM | POA: Diagnosis not present

## 2022-12-28 DIAGNOSIS — Z01818 Encounter for other preprocedural examination: Secondary | ICD-10-CM

## 2022-12-28 LAB — CBC
HCT: 44.7 % (ref 39.0–52.0)
Hemoglobin: 15.2 g/dL (ref 13.0–17.0)
MCH: 30.1 pg (ref 26.0–34.0)
MCHC: 34 g/dL (ref 30.0–36.0)
MCV: 88.5 fL (ref 80.0–100.0)
Platelets: 176 10*3/uL (ref 150–400)
RBC: 5.05 MIL/uL (ref 4.22–5.81)
RDW: 13.7 % (ref 11.5–15.5)
WBC: 7.2 10*3/uL (ref 4.0–10.5)
nRBC: 0 % (ref 0.0–0.2)

## 2022-12-28 LAB — BASIC METABOLIC PANEL
Anion gap: 9 (ref 5–15)
BUN: 21 mg/dL (ref 8–23)
CO2: 24 mmol/L (ref 22–32)
Calcium: 9.8 mg/dL (ref 8.9–10.3)
Chloride: 104 mmol/L (ref 98–111)
Creatinine, Ser: 1.11 mg/dL (ref 0.61–1.24)
GFR, Estimated: >60 mL/min (ref >60)
Glucose, Bld: 103 mg/dL — ABNORMAL HIGH (ref 70–99)
Potassium: 4.3 mmol/L (ref 3.5–5.1)
Sodium: 137 mmol/L (ref 135–145)

## 2022-12-28 LAB — SURGICAL PCR SCREEN
MRSA, PCR: POSITIVE — AB
Staphylococcus aureus: POSITIVE — AB

## 2023-01-03 NOTE — Progress Notes (Signed)
Anesthesia Chart Review   Case: [id number] Date/Time: 01/10/23 1405   Procedure: TOTAL KNEE ARTHROPLASTY (Left: Knee)   Anesthesia type: Spinal   Pre-op diagnosis: Left knee osteoarthritis   Location: [facility] ROOM 10 / [facility]   Surgeons: Durene Romans, MD       DISCUSSION:72 y.o. former smoker with h/o HTN, paroxysmal atrial fibrillation, CAD, left knee OA scheduled for above procedure 01/10/2023 with Dr. Durene Romans.   Pt seen by cardiology 12/22/2022. Per OV note, "Preop eval - he is pending left knee replacement. Prior to his severe knee pain a few months ago, he was able to walk up a steep incline without sob or chest pain. He may proceed with knee replacement surgery. He is an acceptable surgical risk. He can stop his Xarelto 3-4 days before the surgery and restart when the bleeding risk is acceptable. The half life of xarelto is 8 hours."  VS: BP (!) 194/95   Pulse (!) 55   Temp 36.6 C (Oral)   Resp 16   Ht 5\' 10"  (1.778 m)   Wt 96.2 kg   BMI 30.42 kg/m   PROVIDERS: Joselyn Arrow, MD is PCP   Lewayne Bunting, MD is Cardiologist  LABS: Labs reviewed: Acceptable for surgery. (all labs ordered are listed, but only abnormal results are displayed)  Labs Reviewed  SURGICAL PCR SCREEN - Abnormal; Notable for the following components:      Result Value   MRSA, PCR POSITIVE (*)    Staphylococcus aureus POSITIVE (*)    All other components within normal limits  BASIC METABOLIC PANEL - Abnormal; Notable for the following components:   Glucose, Bld 103 (*)    All other components within normal limits  CBC     IMAGES:   EKG:   CV: Echo 03/19/2011 Left ventricle: The cavity size was normal. Wall thickness was increased in a pattern of mild LVH. The estimated ejection fraction was 60%. Wall motion was normal; there were no regional wall motion abnormalities.  Past Medical History:  Diagnosis Date   Anemia    Arthritis    Atrial fibrillation with RVR (HCC)    converted to NSR  with IV dilt 10/12 - no coumadin due to need for Plavix and ASA   Cataract    Clotting disorder (HCC)    PE 2012   Colon polyps 10/2015   (non-adenomatous)   Coronary artery disease    LHC 03/17/11: Dx 30%, OM1 30%, OM2 30%, pRCA 30%, EF 40-45% with anterolat and apical HK   Diverticulosis 10/2015   noted on colonoscopy   Elevated LFTs    Fatty liver    Hematochezia    LIKEY FROM HEMORRHOIDAL BLEEDING; PT HAS H/O   Hyperlipidemia    Hypertension    Internal hemorrhoids 10/2015   noted on colonoscopy   NSTEMI (non-ST elevated myocardial infarction) (HCC)    2012-FLET THAT NON-ST-ELEVATION MI IS POSSIBLY SECONDARY TO CORONARY EMBOLUS FROM HIS AFIB   Syncope    in setting of AFib with RVR    Past Surgical History:  Procedure Laterality Date   CARDIAC CATHETERIZATION  03/18/11   cardiac event monitor  04/01/11   CATARACT EXTRACTION W/PHACO Left 09/18/2014   Procedure: CATARACT EXTRACTION PHACO AND INTRAOCULAR LENS PLACEMENT LEFT EYE;  Surgeon: Gemma Payor, MD;  Location: [facility];  Service: Ophthalmology;  Laterality: Left;  CDE 13.32   COLONOSCOPY     ESOPHAGOGASTRODUODENOSCOPY ENDOSCOPY     MOUTH SURGERY     TOTAL  HIP ARTHROPLASTY Left 07/10/2018   Procedure: TOTAL HIP ARTHROPLASTY ANTERIOR APPROACH;  Surgeon: Durene Romans, MD;  Location: [facility];  Service: Orthopedics;  Laterality: Left;  70 mins   TRANSTHORACIC ECHOCARDIOGRAM  03/19/11   WISDOM TOOTH EXTRACTION      MEDICATIONS:  aspirin 81 MG EC tablet   Coenzyme Q-10 100 MG capsule   ezetimibe (ZETIA) 10 MG tablet   metoprolol tartrate (LOPRESSOR) 25 MG tablet   niacin (VITAMIN B3) 500 MG ER tablet   nitroGLYCERIN (NITROSTAT) 0.4 MG SL tablet   OMEGA-3 FATTY ACIDS PO   rosuvastatin (CRESTOR) 40 MG tablet   VITAMIN D PO   XARELTO 20 MG TABS tablet   No current facility-administered medications for this encounter.   Jodell Cipro Ward, PA-C [facility] [phone]

## 2023-01-10 ENCOUNTER — Ambulatory Visit (HOSPITAL_BASED_OUTPATIENT_CLINIC_OR_DEPARTMENT_OTHER): Payer: Medicare PPO | Admitting: Anesthesiology

## 2023-01-10 ENCOUNTER — Other Ambulatory Visit: Payer: Self-pay

## 2023-01-10 ENCOUNTER — Observation Stay (HOSPITAL_COMMUNITY)
Admission: RE | Admit: 2023-01-10 | Discharge: 2023-01-11 | Disposition: A | Discharge status: Home / Self Care | Payer: Medicare PPO | Attending: Orthopedic Surgery | Admitting: Orthopedic Surgery

## 2023-01-10 ENCOUNTER — Encounter (HOSPITAL_COMMUNITY)
Admission: RE | Disposition: A | Discharge status: Home / Self Care | Payer: Self-pay | Source: Home / Self Care | Attending: Orthopedic Surgery

## 2023-01-10 ENCOUNTER — Encounter (HOSPITAL_COMMUNITY): Payer: Self-pay | Admitting: Orthopedic Surgery

## 2023-01-10 ENCOUNTER — Ambulatory Visit (HOSPITAL_COMMUNITY): Payer: Medicare PPO | Admitting: Physician Assistant

## 2023-01-10 DIAGNOSIS — Z7901 Long term (current) use of anticoagulants: Secondary | ICD-10-CM | POA: Diagnosis not present

## 2023-01-10 DIAGNOSIS — Z87891 Personal history of nicotine dependence: Secondary | ICD-10-CM

## 2023-01-10 DIAGNOSIS — I10 Essential (primary) hypertension: Secondary | ICD-10-CM | POA: Diagnosis not present

## 2023-01-10 DIAGNOSIS — Z79899 Other long term (current) drug therapy: Secondary | ICD-10-CM | POA: Diagnosis not present

## 2023-01-10 DIAGNOSIS — M1712 Unilateral primary osteoarthritis, left knee: Secondary | ICD-10-CM

## 2023-01-10 DIAGNOSIS — Z96652 Presence of left artificial knee joint: Principal | ICD-10-CM

## 2023-01-10 DIAGNOSIS — Z01818 Encounter for other preprocedural examination: Secondary | ICD-10-CM

## 2023-01-10 DIAGNOSIS — Z7982 Long term (current) use of aspirin: Secondary | ICD-10-CM | POA: Insufficient documentation

## 2023-01-10 DIAGNOSIS — I2510 Atherosclerotic heart disease of native coronary artery without angina pectoris: Secondary | ICD-10-CM | POA: Diagnosis not present

## 2023-01-10 DIAGNOSIS — Z96642 Presence of left artificial hip joint: Secondary | ICD-10-CM | POA: Insufficient documentation

## 2023-01-10 DIAGNOSIS — I4891 Unspecified atrial fibrillation: Secondary | ICD-10-CM | POA: Insufficient documentation

## 2023-01-10 DIAGNOSIS — G8918 Other acute postprocedural pain: Secondary | ICD-10-CM | POA: Diagnosis not present

## 2023-01-10 HISTORY — PX: TOTAL KNEE ARTHROPLASTY: SHX125

## 2023-01-10 SURGERY — ARTHROPLASTY, KNEE, TOTAL
Anesthesia: Monitor Anesthesia Care | Site: Knee | Laterality: Left

## 2023-01-10 MED ORDER — BISACODYL 10 MG RE SUPP: 10.0000 mg | Freq: Every day | RECTAL | Status: DC | PRN

## 2023-01-10 MED ORDER — STERILE WATER FOR IRRIGATION IR SOLN
Status: DC | PRN
  Administered 2023-01-10: 2000 mL

## 2023-01-10 MED ORDER — METOCLOPRAMIDE HCL 5 MG PO TABS: 5.0000 mg | ORAL_TABLET | Freq: Three times a day (TID) | ORAL | Status: DC | PRN

## 2023-01-10 MED ORDER — TRANEXAMIC ACID-NACL 1000-0.7 MG/100ML-% IV SOLN
1000.0000 mg | INTRAVENOUS | Status: AC
  Administered 2023-01-10: 1000 mg via INTRAVENOUS
  Filled 2023-01-10: qty 100

## 2023-01-10 MED ORDER — DEXAMETHASONE SODIUM PHOSPHATE 10 MG/ML IJ SOLN
10.0000 mg | Freq: Once | INTRAMUSCULAR | Status: AC
  Administered 2023-01-11: 10 mg via INTRAVENOUS
  Filled 2023-01-10: qty 1

## 2023-01-10 MED ORDER — CEFAZOLIN SODIUM-DEXTROSE 2-4 GM/100ML-% IV SOLN
2.0000 g | INTRAVENOUS | Status: AC
  Administered 2023-01-10: 2 g via INTRAVENOUS
  Filled 2023-01-10: qty 100

## 2023-01-10 MED ORDER — NIACIN ER (ANTIHYPERLIPIDEMIC) 500 MG PO TBCR
500.0000 mg | EXTENDED_RELEASE_TABLET | Freq: Every day | ORAL | Status: DC
  Administered 2023-01-10: 500 mg via ORAL
  Filled 2023-01-10: qty 1

## 2023-01-10 MED ORDER — SODIUM CHLORIDE 0.9 % IV SOLN: INTRAVENOUS | Status: DC

## 2023-01-10 MED ORDER — EZETIMIBE 10 MG PO TABS
10.0000 mg | ORAL_TABLET | Freq: Every day | ORAL | Status: DC
  Administered 2023-01-10 – 2023-01-11 (×2): 10 mg via ORAL
  Filled 2023-01-10 (×2): qty 1

## 2023-01-10 MED ORDER — ROPIVACAINE HCL 7.5 MG/ML IJ SOLN
INTRAMUSCULAR | Status: DC | PRN
  Administered 2023-01-10: 20 mL via PERINEURAL

## 2023-01-10 MED ORDER — CEFAZOLIN SODIUM-DEXTROSE 2-4 GM/100ML-% IV SOLN
2.0000 g | Freq: Four times a day (QID) | INTRAVENOUS | Status: AC
  Administered 2023-01-10 – 2023-01-11 (×2): 2 g via INTRAVENOUS
  Filled 2023-01-10 (×2): qty 100

## 2023-01-10 MED ORDER — PROPOFOL 500 MG/50ML IV EMUL
INTRAVENOUS | Status: DC | PRN
  Administered 2023-01-10: 100 ug/kg/min via INTRAVENOUS

## 2023-01-10 MED ORDER — METHOCARBAMOL 500 MG PO TABS: 500.0000 mg | ORAL_TABLET | Freq: Four times a day (QID) | ORAL | Status: DC | PRN

## 2023-01-10 MED ORDER — MENTHOL 3 MG MT LOZG: 1.0000 | LOZENGE | OROMUCOSAL | Status: DC | PRN

## 2023-01-10 MED ORDER — POVIDONE-IODINE 10 % EX SWAB: 2.0000 | Freq: Once | CUTANEOUS | Status: DC

## 2023-01-10 MED ORDER — KETOROLAC TROMETHAMINE 30 MG/ML IJ SOLN
INTRAMUSCULAR | Status: DC | PRN
  Administered 2023-01-10: 30 mg

## 2023-01-10 MED ORDER — DEXMEDETOMIDINE HCL IN NACL 80 MCG/20ML IV SOLN
INTRAVENOUS | Status: AC
  Filled 2023-01-10: qty 20

## 2023-01-10 MED ORDER — HYDROMORPHONE HCL 1 MG/ML IJ SOLN: 0.5000 mg | INTRAMUSCULAR | Status: DC | PRN

## 2023-01-10 MED ORDER — FENTANYL CITRATE PF 50 MCG/ML IJ SOSY
100.0000 ug | PREFILLED_SYRINGE | INTRAMUSCULAR | Status: DC
  Administered 2023-01-10: 50 ug via INTRAVENOUS
  Filled 2023-01-10: qty 2

## 2023-01-10 MED ORDER — BUPIVACAINE-EPINEPHRINE (PF) 0.25% -1:200000 IJ SOLN
INTRAMUSCULAR | Status: DC | PRN
  Administered 2023-01-10: 30 mL

## 2023-01-10 MED ORDER — NITROGLYCERIN 0.4 MG SL SUBL: 0.4000 mg | SUBLINGUAL_TABLET | SUBLINGUAL | Status: DC | PRN

## 2023-01-10 MED ORDER — BUPIVACAINE IN DEXTROSE 0.75-8.25 % IT SOLN
INTRATHECAL | Status: DC | PRN
  Administered 2023-01-10: 2 mL via INTRATHECAL

## 2023-01-10 MED ORDER — KETOROLAC TROMETHAMINE 30 MG/ML IJ SOLN
INTRAMUSCULAR | Status: AC
  Filled 2023-01-10: qty 1

## 2023-01-10 MED ORDER — BUPIVACAINE HCL 0.25 % IJ SOLN
INTRAMUSCULAR | Status: AC
  Filled 2023-01-10: qty 1

## 2023-01-10 MED ORDER — METOCLOPRAMIDE HCL 5 MG/ML IJ SOLN: 5.0000 mg | Freq: Three times a day (TID) | INTRAMUSCULAR | Status: DC | PRN

## 2023-01-10 MED ORDER — OXYCODONE HCL 5 MG PO TABS: 5.0000 mg | ORAL_TABLET | ORAL | Status: DC | PRN

## 2023-01-10 MED ORDER — OXYCODONE HCL 5 MG PO TABS: 5.0000 mg | ORAL_TABLET | Freq: Once | ORAL | Status: DC | PRN

## 2023-01-10 MED ORDER — ONDANSETRON HCL 4 MG/2ML IJ SOLN: 4.0000 mg | Freq: Four times a day (QID) | INTRAMUSCULAR | Status: DC | PRN

## 2023-01-10 MED ORDER — POLYETHYLENE GLYCOL 3350 17 G PO PACK
17.0000 g | PACK | Freq: Two times a day (BID) | ORAL | Status: DC
  Administered 2023-01-10 – 2023-01-11 (×2): 17 g via ORAL
  Filled 2023-01-10 (×2): qty 1

## 2023-01-10 MED ORDER — MIDAZOLAM HCL 2 MG/2ML IJ SOLN
2.0000 mg | INTRAMUSCULAR | Status: DC
  Filled 2023-01-10: qty 2

## 2023-01-10 MED ORDER — ACETAMINOPHEN 500 MG PO TABS
1000.0000 mg | ORAL_TABLET | Freq: Four times a day (QID) | ORAL | Status: DC
  Administered 2023-01-10 – 2023-01-11 (×3): 1000 mg via ORAL
  Filled 2023-01-10 (×3): qty 2

## 2023-01-10 MED ORDER — PHENOL 1.4 % MT LIQD: 1.0000 | OROMUCOSAL | Status: DC | PRN

## 2023-01-10 MED ORDER — RIVAROXABAN 20 MG PO TABS
20.0000 mg | ORAL_TABLET | Freq: Every day | ORAL | Status: DC
  Filled 2023-01-10: qty 1

## 2023-01-10 MED ORDER — OXYCODONE HCL 5 MG/5ML PO SOLN: 5.0000 mg | Freq: Once | ORAL | Status: DC | PRN

## 2023-01-10 MED ORDER — ONDANSETRON HCL 4 MG/2ML IJ SOLN
INTRAMUSCULAR | Status: DC | PRN
Start: 2023-01-10 — End: 2023-01-10
  Administered 2023-01-10: 4 mg via INTRAVENOUS

## 2023-01-10 MED ORDER — 0.9 % SODIUM CHLORIDE (POUR BTL) OPTIME
TOPICAL | Status: DC | PRN
  Administered 2023-01-10: 1000 mL

## 2023-01-10 MED ORDER — SODIUM CHLORIDE (PF) 0.9 % IJ SOLN
INTRAMUSCULAR | Status: DC | PRN
  Administered 2023-01-10: 30 mL

## 2023-01-10 MED ORDER — MUPIROCIN 2 % EX OINT: 1.0000 | TOPICAL_OINTMENT | Freq: Two times a day (BID) | CUTANEOUS | 0 refills | Status: AC

## 2023-01-10 MED ORDER — TRAMADOL HCL 50 MG PO TABS: 50.0000 mg | ORAL_TABLET | Freq: Four times a day (QID) | ORAL | Status: DC | PRN

## 2023-01-10 MED ORDER — LACTATED RINGERS IV SOLN: INTRAVENOUS | Status: DC

## 2023-01-10 MED ORDER — ROSUVASTATIN CALCIUM 20 MG PO TABS
40.0000 mg | ORAL_TABLET | Freq: Every day | ORAL | Status: DC
  Administered 2023-01-10 – 2023-01-11 (×2): 40 mg via ORAL
  Filled 2023-01-10 (×2): qty 2

## 2023-01-10 MED ORDER — FENTANYL CITRATE PF 50 MCG/ML IJ SOSY: 25.0000 ug | PREFILLED_SYRINGE | INTRAMUSCULAR | Status: DC | PRN

## 2023-01-10 MED ORDER — CHLORHEXIDINE GLUCONATE 4 % EX SOLN: 1.0000 | CUTANEOUS | 1 refills | Status: DC

## 2023-01-10 MED ORDER — DOCUSATE SODIUM 100 MG PO CAPS
100.0000 mg | ORAL_CAPSULE | Freq: Two times a day (BID) | ORAL | Status: DC
  Administered 2023-01-10 – 2023-01-11 (×2): 100 mg via ORAL
  Filled 2023-01-10 (×2): qty 1

## 2023-01-10 MED ORDER — DEXAMETHASONE SODIUM PHOSPHATE 10 MG/ML IJ SOLN
8.0000 mg | Freq: Once | INTRAMUSCULAR | Status: AC
  Administered 2023-01-10: 8 mg via INTRAVENOUS

## 2023-01-10 MED ORDER — LIDOCAINE HCL (PF) 2 % IJ SOLN
INTRAMUSCULAR | Status: AC
  Filled 2023-01-10: qty 15

## 2023-01-10 MED ORDER — SODIUM CHLORIDE 0.9 % IR SOLN
Status: DC | PRN
  Administered 2023-01-10: 1000 mL

## 2023-01-10 MED ORDER — ONDANSETRON HCL 4 MG PO TABS: 4.0000 mg | ORAL_TABLET | Freq: Four times a day (QID) | ORAL | Status: DC | PRN

## 2023-01-10 MED ORDER — DIPHENHYDRAMINE HCL 12.5 MG/5ML PO ELIX: 12.5000 mg | ORAL_SOLUTION | ORAL | Status: DC | PRN

## 2023-01-10 MED ORDER — CHLORHEXIDINE GLUCONATE 0.12 % MT SOLN: 15.0000 mL | Freq: Once | OROMUCOSAL | Status: DC

## 2023-01-10 MED ORDER — METHOCARBAMOL 500 MG IVPB - SIMPLE MED: 500.0000 mg | Freq: Four times a day (QID) | INTRAVENOUS | Status: DC | PRN

## 2023-01-10 MED ORDER — VANCOMYCIN HCL IN DEXTROSE 1-5 GM/200ML-% IV SOLN
1000.0000 mg | Freq: Once | INTRAVENOUS | Status: AC
  Administered 2023-01-10: 1000 mg via INTRAVENOUS
  Filled 2023-01-10: qty 200

## 2023-01-10 MED ORDER — TRANEXAMIC ACID-NACL 1000-0.7 MG/100ML-% IV SOLN
1000.0000 mg | Freq: Once | INTRAVENOUS | Status: AC
  Administered 2023-01-10: 1000 mg via INTRAVENOUS
  Filled 2023-01-10: qty 100

## 2023-01-10 MED ORDER — PHENYLEPHRINE 80 MCG/ML (10ML) SYRINGE FOR IV PUSH (FOR BLOOD PRESSURE SUPPORT)
PREFILLED_SYRINGE | INTRAVENOUS | Status: AC
  Filled 2023-01-10: qty 10

## 2023-01-10 MED ORDER — SODIUM CHLORIDE (PF) 0.9 % IJ SOLN
INTRAMUSCULAR | Status: AC
  Filled 2023-01-10: qty 30

## 2023-01-10 MED ORDER — ORAL CARE MOUTH RINSE: 15.0000 mL | Freq: Once | OROMUCOSAL | Status: DC

## 2023-01-10 MED ORDER — METOPROLOL TARTRATE 25 MG PO TABS
25.0000 mg | ORAL_TABLET | Freq: Two times a day (BID) | ORAL | Status: DC
  Administered 2023-01-10 – 2023-01-11 (×2): 25 mg via ORAL
  Filled 2023-01-10 (×2): qty 1

## 2023-01-10 MED ORDER — EPHEDRINE 5 MG/ML INJ
INTRAVENOUS | Status: AC
  Filled 2023-01-10: qty 10

## 2023-01-10 MED ORDER — LACTATED RINGERS IV SOLN: INTRAVENOUS | Status: DC | PRN

## 2023-01-10 SURGICAL SUPPLY — 56 items
ADH SKN CLS APL DERMABOND .7 (GAUZE/BANDAGES/DRESSINGS) ×1
ATTUNE MED ANAT PAT 38 KNEE (Knees) IMPLANT
BAG COUNTER SPONGE SURGICOUNT (BAG) IMPLANT
BAG SPEC THK2 15X12 ZIP CLS (MISCELLANEOUS)
BAG SPNG CNTER NS LX DISP (BAG)
BAG ZIPLOCK 12X15 (MISCELLANEOUS) IMPLANT
BASE TIBIAL CEM ATTUNE SZ 7 (Knees) ×1 IMPLANT
BASEPLATE TIB CEM ATTUNE SZ7 (Knees) IMPLANT
BLADE SAW SGTL 13.0X1.19X90.0M (BLADE) ×1 IMPLANT
BNDG CMPR 6 X 5 YARDS HK CLSR (GAUZE/BANDAGES/DRESSINGS) ×1
BNDG ELASTIC 6INX 5YD STR LF (GAUZE/BANDAGES/DRESSINGS) ×1 IMPLANT
BOWL SMART MIX CTS (DISPOSABLE) ×1 IMPLANT
BSPLAT TIB 7 CMNT FX BRNG STRL (Knees) ×1 IMPLANT
CEMENT HV SMART SET (Cement) IMPLANT
COMP FEM CMT ATTUNE NRW 6 LT (Joint) ×1 IMPLANT
COMPONENT FEM CMT ATTN NRW 6LT (Joint) IMPLANT
COVER SURGICAL LIGHT HANDLE (MISCELLANEOUS) ×1 IMPLANT
CUFF TOURN SGL QUICK 34 (TOURNIQUET CUFF) ×1
CUFF TRNQT CYL 34X4.125X (TOURNIQUET CUFF) ×1 IMPLANT
DERMABOND ADVANCED .7 DNX12 (GAUZE/BANDAGES/DRESSINGS) ×1 IMPLANT
DRAPE U-SHAPE 47X51 STRL (DRAPES) ×1 IMPLANT
DRESSING AQUACEL AG SP 3.5X10 (GAUZE/BANDAGES/DRESSINGS) ×1 IMPLANT
DRSG AQUACEL AG ADV 3.5X10 (GAUZE/BANDAGES/DRESSINGS) IMPLANT
DRSG AQUACEL AG SP 3.5X10 (GAUZE/BANDAGES/DRESSINGS) ×1
DURAPREP 26ML APPLICATOR (WOUND CARE) ×2 IMPLANT
ELECT REM PT RETURN 15FT ADLT (MISCELLANEOUS) ×1 IMPLANT
GLOVE BIO SURGEON STRL SZ 6 (GLOVE) ×1 IMPLANT
GLOVE BIOGEL PI IND STRL 6.5 (GLOVE) ×1 IMPLANT
GLOVE BIOGEL PI IND STRL 7.5 (GLOVE) ×1 IMPLANT
GLOVE ORTHO TXT STRL SZ7.5 (GLOVE) ×2 IMPLANT
GOWN STRL REUS W/ TWL LRG LVL3 (GOWN DISPOSABLE) ×2 IMPLANT
GOWN STRL REUS W/TWL LRG LVL3 (GOWN DISPOSABLE) ×2
HANDPIECE INTERPULSE COAX TIP (DISPOSABLE) ×1
HOLDER FOLEY CATH W/STRAP (MISCELLANEOUS) IMPLANT
INSERT TIB ATTUNE KNEE 6 5 LT (Insert) IMPLANT
KIT TURNOVER KIT A (KITS) IMPLANT
MANIFOLD NEPTUNE II (INSTRUMENTS) ×1 IMPLANT
NDL SAFETY ECLIP 18X1.5 (MISCELLANEOUS) IMPLANT
NS IRRIG 1000ML POUR BTL (IV SOLUTION) ×1 IMPLANT
PACK TOTAL KNEE CUSTOM (KITS) ×1 IMPLANT
PIN FIX SIGMA LCS THRD HI (PIN) IMPLANT
PROTECTOR NERVE ULNAR (MISCELLANEOUS) ×1 IMPLANT
SET HNDPC FAN SPRY TIP SCT (DISPOSABLE) ×1 IMPLANT
SET PAD KNEE POSITIONER (MISCELLANEOUS) ×1 IMPLANT
SPIKE FLUID TRANSFER (MISCELLANEOUS) ×2 IMPLANT
SUT MNCRL AB 4-0 PS2 18 (SUTURE) ×1 IMPLANT
SUT STRATAFIX PDS+ 0 24IN (SUTURE) ×1 IMPLANT
SUT VIC AB 1 CT1 36 (SUTURE) ×1 IMPLANT
SUT VIC AB 2-0 CT1 27 (SUTURE) ×2
SUT VIC AB 2-0 CT1 TAPERPNT 27 (SUTURE) ×2 IMPLANT
SYR 3ML LL SCALE MARK (SYRINGE) ×1 IMPLANT
TOWEL GREEN STERILE FF (TOWEL DISPOSABLE) ×1 IMPLANT
TRAY FOLEY MTR SLVR 16FR STAT (SET/KITS/TRAYS/PACK) ×1 IMPLANT
TUBE SUCTION HIGH CAP CLEAR NV (SUCTIONS) ×1 IMPLANT
WATER STERILE IRR 1000ML POUR (IV SOLUTION) ×2 IMPLANT
WRAP KNEE MAXI GEL POST OP (GAUZE/BANDAGES/DRESSINGS) ×1 IMPLANT

## 2023-01-10 NOTE — Anesthesia Postprocedure Evaluation (Signed)
Anesthesia Post Note  Patient: Timothy Estes  Procedure(s) Performed: TOTAL KNEE ARTHROPLASTY (Left: Knee)     Patient location during evaluation: PACU Anesthesia Type: MAC, Spinal and Regional Level of consciousness: oriented and awake and alert Pain management: pain level controlled Vital Signs Assessment: post-procedure vital signs reviewed and stable Respiratory status: spontaneous breathing, respiratory function stable and patient connected to nasal cannula oxygen Cardiovascular status: blood pressure returned to baseline and stable Postop Assessment: no headache, no backache and no apparent nausea or vomiting Anesthetic complications: no   No notable events documented.  Last Vitals:  Vitals:   01/10/23 1615 01/10/23 1630  BP: 117/72   Pulse: 62   Resp: 13   Temp: (!) 35.9 C (!) 35.9 C  SpO2: 92% 93%    Last Pain:  Vitals:   01/10/23 1630  TempSrc:   PainSc: 0-No pain                 Kiala Faraj S

## 2023-01-10 NOTE — Anesthesia Procedure Notes (Signed)
Anesthesia Regional Block: Adductor canal block   Pre-Anesthetic Checklist: , timeout performed,  Correct Patient, Correct Site, Correct Laterality,  Correct Procedure, Correct Position, site marked,  Risks and benefits discussed,  Surgical consent,  Pre-op evaluation,  At surgeon's request and post-op pain management  Laterality: Left  Prep: chloraprep       Needles:  Injection technique: Single-shot  Needle Type: Echogenic Needle     Needle Length: 9cm  Needle Gauge: 21     Additional Needles:   Narrative:  Start time: 01/10/2023 1:18 PM End time: 01/10/2023 1:25 PM Injection made incrementally with aspirations every 5 mL.  Performed by: Personally  Anesthesiologist: Achille Rich, MD  Additional Notes: Pt tolerated the procedure well.

## 2023-01-10 NOTE — Op Note (Signed)
NAME:  Timothy Estes                      MEDICAL RECORD NO.:  [id number]                             FACILITY:  [facility]      PHYSICIAN:  [name]. Charlann Boxer, M.D.  DATE OF BIRTH:  [birth year]      DATE OF PROCEDURE:  01/10/2023                                     OPERATIVE REPORT         PREOPERATIVE DIAGNOSIS:  Left knee osteoarthritis.      POSTOPERATIVE DIAGNOSIS:  Left knee osteoarthritis.      FINDINGS:  The patient was noted to have complete loss of cartilage and   bone-on-bone arthritis with associated osteophytes in the medial and patellofemoral compartments of   the knee with a significant synovitis and associated effusion.  The patient had failed months of conservative treatment including medications, injection therapy, activity modification.     PROCEDURE:  Left total knee replacement.      COMPONENTS USED:  DePuy Attune FB CR MS knee   system, a size 6N femur, 7 tibia, size 5 mm CR MS AOX insert, and 38 anatomic patellar   button.      SURGEON:  [name]. Charlann Boxer, M.D.      ASSISTANT:  Rosalene Billings, PA-C.      ANESTHESIA:  Regional and Spinal.      SPECIMENS:  None.      COMPLICATION:  None.      DRAINS:  None.  EBL: <200 cc      TOURNIQUET TIME:  35 min at 225 mmHg     The patient was stable to the recovery room.      INDICATION FOR PROCEDURE:  Timothy Estes is a 72 y.o. male patient of   mine.  The patient had been seen, evaluated, and treated for months conservatively in the   office with medication, activity modification, and injections.  The patient had   radiographic changes of bone-on-bone arthritis with endplate sclerosis and osteophytes noted.  Based on the radiographic changes and failed conservative measures, the patient   decided to proceed with definitive treatment, total knee replacement.  Risks of infection, DVT, component failure, need for revision surgery, neurovascular injury were reviewed in the office setting.  The postop course was reviewed  stressing the efforts to maximize post-operative satisfaction and function.  Consent was obtained for benefit of pain   relief.      PROCEDURE IN DETAIL:  The patient was brought to the operative theater.   Once adequate anesthesia, preoperative antibiotics, 2 gm of Ancef,1 gm of Tranexamic Acid, and 10 mg of Decadron administered, the patient was positioned supine with a left thigh tourniquet placed.  The  left lower extremity was prepped and draped in sterile fashion.  A time-   out was performed identifying the patient, planned procedure, and the appropriate extremity.      The left lower extremity was placed in the [facility] leg holder.  The leg was   exsanguinated, tourniquet elevated to 225 mmHg.  A midline incision was   made followed by median parapatellar arthrotomy.  Following initial   exposure, attention was first directed  to the patella.  Precut   measurement was noted to be 26 mm.  I resected down to 14 mm and used a   38 anatomic patellar button to restore patellar height as well as cover the cut surface.      The lug holes were drilled and a metal shim was placed to protect the   patella from retractors and saw blade during the procedure.      At this point, attention was now directed to the femur.  The femoral   canal was opened with a drill, irrigated to try to prevent fat emboli.  An   intramedullary rod was passed at 5 degrees valgus, 9 mm of bone was   resected off the distal femur.  Following this resection, the tibia was   subluxated anteriorly.  Using the extramedullary guide, 2 mm of bone was resected off   the proximal medial tibia.  We confirmed the gap would be   stable medially and laterally with a size 5 spacer block as well as confirmed that the tibial cut was perpendicular in the coronal plane, checking with an alignment rod.      Once this was done, I sized the femur to be a size 6 in the anterior-   posterior dimension, chose a narrow component based on  medial and   lateral dimension.  The size 6 rotation block was then pinned in   position anterior referenced using the C-clamp to set rotation.  The   anterior, posterior, and  chamfer cuts were made without difficulty nor   notching making certain that I was along the anterior cortex to help   with flexion gap stability.      The final box cut was made off the lateral aspect of distal femur.      At this point, the tibia was sized to be a size 7.  The size 7 tray was   then pinned in position through the medial third of the tubercle,   drilled, and keel punched.  Trial reduction was now carried with a 6 femur,  7 tibia, a size 5 mm CR MS insert, and the 38 anatomic patella botton.  The knee was brought to full extension with good flexion stability with the patella   tracking through the trochlea without application of pressure.  Given   all these findings the trial components removed.  Final components were   opened and cement was mixed.  The knee was irrigated with normal saline solution and pulse lavage.  The synovial lining was   then injected with 30 cc of 0.25% Marcaine with epinephrine, 1 cc of Toradol and 30 cc of NS for a total of 61 cc.     Final implants were then cemented onto cleaned and dried cut surfaces of bone with the knee brought to extension with a size 5 mm CR MS trial insert.      Once the cement had fully cured, excess cement was removed   throughout the knee.  I confirmed that I was satisfied with the range of   motion and stability, and the final size 5 mm CR MS AOX insert was chosen.  It was   placed into the knee.      The tourniquet had been let down at 35 minutes.  No significant   hemostasis was required.  The extensor mechanism was then reapproximated using #1 Vicryl and #1 Stratafix sutures with the knee   in flexion.  The  remaining wound was closed with 2-0 Vicryl and running 4-0 Monocryl.   The knee was cleaned, dried, dressed sterilely using Dermabond  and   Aquacel dressing.  The patient was then   brought to recovery room in stable condition, tolerating the procedure   well.   Please note that Physician Assistant, Rosalene Billings, PA-C was present for the entirety of the case, and was utilized for pre-operative positioning, peri-operative retractor management, general facilitation of the procedure and for primary wound closure at the end of the case.              [name] Charlann Boxer, M.D.    01/10/2023 12:48 PM

## 2023-01-10 NOTE — Transfer of Care (Signed)
Immediate Anesthesia Transfer of Care Note  Patient: Timothy Estes  Procedure(s) Performed: TOTAL KNEE ARTHROPLASTY (Left: Knee)  Patient Location: PACU  Anesthesia Type:MAC combined with regional for post-op pain  Level of Consciousness: awake and alert   Airway & Oxygen Therapy: Patient Spontanous Breathing and Patient connected to nasal cannula oxygen  Post-op Assessment: Report given to RN and Post -op Vital signs reviewed and stable  Post vital signs: Reviewed and stable  Last Vitals:  Vitals Value Taken Time  BP 119/77 01/10/23 1605  Temp    Pulse 71 01/10/23 1606  Resp 17 01/10/23 1606  SpO2 92 % 01/10/23 1606  Vitals shown include unfiled device data.  Last Pain:  Vitals:   01/10/23 1400  TempSrc:   PainSc: 0-No pain         Complications: No notable events documented.

## 2023-01-10 NOTE — Interval H&P Note (Signed)
History and Physical Interval Note:  01/10/2023 12:47 PM  Timothy Estes  has presented today for surgery, with the diagnosis of Left knee osteoarthritis.  The various methods of treatment have been discussed with the patient and family. After consideration of risks, benefits and other options for treatment, the patient has consented to  Procedure(s): TOTAL KNEE ARTHROPLASTY (Left) as a surgical intervention.  The patient's history has been reviewed, patient examined, no change in status, stable for surgery.  I have reviewed the patient's chart and labs.  Questions were answered to the patient's satisfaction.     [name]

## 2023-01-10 NOTE — Anesthesia Procedure Notes (Signed)
Spinal  Patient location during procedure: OR Start time: 01/10/2023 2:20 PM End time: 01/10/2023 2:23 PM Reason for block: surgical anesthesia Staffing Performed: anesthesiologist  Anesthesiologist: Achille Rich, MD Performed by: Achille Rich, MD Authorized by: Achille Rich, MD   Preanesthetic Checklist Completed: patient identified, IV checked, risks and benefits discussed, surgical consent, monitors and equipment checked, pre-op evaluation and timeout performed Spinal Block Patient position: sitting Prep: DuraPrep Patient monitoring: cardiac monitor, continuous pulse ox and blood pressure Approach: midline Location: L3-4 Injection technique: single-shot Needle Needle type: Pencan  Needle gauge: 24 G Needle length: 9 cm Assessment Sensory level: T10 Events: CSF return Additional Notes Functioning IV was confirmed and monitors were applied. Sterile prep and drape, including hand hygiene and sterile gloves were used. The patient was positioned and the spine was prepped. The skin was anesthetized with lidocaine.  Free flow of clear CSF was obtained prior to injecting local anesthetic into the CSF.  The spinal needle aspirated freely following injection.  The needle was carefully withdrawn.  The patient tolerated the procedure well.

## 2023-01-10 NOTE — Discharge Instructions (Signed)

## 2023-01-10 NOTE — Care Plan (Signed)
Ortho Bundle Case Management Note  Patient Details  Name: Timothy Estes MRN: [id number] Date of Birth: 1950/08/29                  L TKA on 01/10/23.  DCP: Home with wife Timothy Estes.  DME: No needs. Has RW and cane.  PT: Timothy Estes 8/23. Referral faxed.   DME Arranged:  N/A DME Agency:       Additional Comments: Please contact me with any questions of if this plan should need to change.    Despina Pole, Case Manager  [facility]  [phone] 01/10/2023, 3:26 PM

## 2023-01-10 NOTE — H&P (Signed)
TOTAL KNEE ADMISSION H&P  Patient is being admitted for left total knee arthroplasty.  Therapy Plans: outpatient therapy at [facility] in [place] Disposition: Home with wife Planned DVT Prophylaxis: Xarelto 20 mg DME needed: none PCP: Dr. Lynelle Doctor, clearance received Cardiologist: Dr. Ladona Ridgel, clearance received TXA: IV Allergies: NKDA Anesthesia Concerns: none BMI: 32 Last HgbA1c: Not diabetic   Other: -Tramadol/oxycodone, robaxin, tylenol - Didn't take any pain meds with hip replacement - Hx of PE  - **HAD PAINFUL CATHETERIZATION last surgery >> no foley -- will either consult urology to help or try without foley **  Subjective:  Chief Complaint:left knee pain.  HPI: Timothy Estes, 72 y.o. male, has a history of pain and functional disability in the left knee due to arthritis and has failed non-surgical conservative treatments for greater than 12 weeks to includeNSAID's and/or analgesics and corticosteriod injections.  Onset of symptoms was gradual, starting 2 years ago with gradually worsening course since that time. The patient noted no past surgery on the left knee(s).  Patient currently rates pain in the left knee(s) at 8 out of 10 with activity. Patient has worsening of pain with activity and weight bearing, pain that interferes with activities of daily living, and pain with passive range of motion.  Patient has evidence of joint space narrowing by imaging studies.  There is no active infection.  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   Iron deficiency anemia 11/08/2017   Gout of knee 10/11/2016   Impaired fasting glucose 07/24/2014   Mixed hyperlipidemia 07/10/2012   Elevated LFTs 04/01/2011   NSTEMI (non-ST elevated myocardial infarction) (HCC) 04/01/2011   Atrial fibrillation (HCC) 04/01/2011   Essential hypertension 04/01/2011   HLD (hyperlipidemia) 04/01/2011   Nonobstructive CAD by Cath 02/2011 04/01/2011   Past  Medical History:  Diagnosis Date   Anemia    Arthritis    Atrial fibrillation with RVR (HCC)    converted to NSR with IV dilt 10/12 - no coumadin due to need for Plavix and ASA   Cataract    Clotting disorder (HCC)    PE 2012   Colon polyps 10/2015   (non-adenomatous)   Coronary artery disease    LHC 03/17/11: Dx 30%, OM1 30%, OM2 30%, pRCA 30%, EF 40-45% with anterolat and apical HK   Diverticulosis 10/2015   noted on colonoscopy   Elevated LFTs    Fatty liver    Hematochezia    LIKEY FROM HEMORRHOIDAL BLEEDING; PT HAS H/O   Hyperlipidemia    Hypertension    Internal hemorrhoids 10/2015   noted on colonoscopy   NSTEMI (non-ST elevated myocardial infarction) (HCC)    2012-FLET THAT NON-ST-ELEVATION MI IS POSSIBLY SECONDARY TO CORONARY EMBOLUS FROM HIS AFIB   Syncope    in setting of AFib with RVR    Past Surgical History:  Procedure Laterality Date   CARDIAC CATHETERIZATION  03/18/11   cardiac event monitor  04/01/11   CATARACT EXTRACTION W/PHACO Left 09/18/2014   Procedure: CATARACT EXTRACTION PHACO AND INTRAOCULAR LENS PLACEMENT LEFT EYE;  Surgeon: Gemma Payor, MD;  Location: [facility];  Service: Ophthalmology;  Laterality: Left;  CDE 13.32   COLONOSCOPY     ESOPHAGOGASTRODUODENOSCOPY ENDOSCOPY     MOUTH SURGERY     TOTAL HIP ARTHROPLASTY Left 07/10/2018   Procedure: TOTAL HIP ARTHROPLASTY ANTERIOR APPROACH;  Surgeon: Durene Romans, MD;  Location: [facility];  Service: Orthopedics;  Laterality: Left;  70 mins  TRANSTHORACIC ECHOCARDIOGRAM  03/19/11   WISDOM TOOTH EXTRACTION      No current facility-administered medications for this encounter.   Current Outpatient Medications  Medication Sig Dispense Refill Last Dose   aspirin 81 MG EC tablet Take 81 mg by mouth at bedtime. Swallow whole.      Coenzyme Q-10 100 MG capsule Take 100 mg by mouth in the morning.      ezetimibe (ZETIA) 10 MG tablet TAKE 1 TABLET BY MOUTH EVERY DAY 90 tablet 0    metoprolol tartrate (LOPRESSOR) 25  MG tablet Take 1 tablet (25 mg total) by mouth 2 (two) times daily. 180 tablet 1    niacin (VITAMIN B3) 500 MG ER tablet TAKE 1 TABLET BY MOUTH AT BEDTIME. 90 tablet 0    nitroGLYCERIN (NITROSTAT) 0.4 MG SL tablet Place 1 tablet (0.4 mg total) under the tongue every 5 (five) minutes as needed for chest pain. 25 tablet 6    OMEGA-3 FATTY ACIDS PO Take 2,400 mg by mouth 2 (two) times daily.      rosuvastatin (CRESTOR) 40 MG tablet Take 1 tablet (40 mg total) by mouth daily. 90 tablet 1    VITAMIN D PO Take 1,000 Units by mouth in the morning.      XARELTO 20 MG TABS tablet TAKE 1 TABLET BY MOUTH DAILY WITH SUPPER 90 tablet 1    No Known Allergies  Social History   Tobacco Use   Smoking status: Former    Current packs/day: 0.00    Average packs/day: 0.5 packs/day for 3.0 years (1.5 ttl pk-yrs)    Types: Cigarettes    Start date: 11/24/1971    Quit date: 11/24/1974    Years since quitting: 48.1   Smokeless tobacco: Former  Substance Use Topics   Alcohol use: Yes    Comment: rare    Family History  Problem Relation Age of Onset   Hyperlipidemia Brother    Hypertension Brother    Arthritis Mother    Heart disease Neg Hx    Cancer Neg Hx    Diabetes Neg Hx    Colon cancer Neg Hx    Esophageal cancer Neg Hx    Rectal cancer Neg Hx    Stomach cancer Neg Hx      Review of Systems  Constitutional:  Negative for chills and fever.  Respiratory:  Negative for cough and shortness of breath.   Cardiovascular:  Negative for chest pain.  Gastrointestinal:  Negative for nausea and vomiting.  Musculoskeletal:  Positive for arthralgias.     Objective:  Physical Exam Well nourished and well developed. General: Alert and oriented x3, cooperative and pleasant, no acute distress. Head: normocephalic, atraumatic, neck supple. Eyes: EOMI.  Musculoskeletal: Left knee exam: No palpable effusion, warmth erythema Slight flexion contracture to both knees with flexion over 110 degrees Right  knee exam reveals less tenderness medially without palpable effusion He does have a flexion contracture on the right knee as well. Left hip exam reveals a fluid range of motion without reproducible pain or groin pain or referred pain  Calves soft and nontender. Motor function intact in LE. Strength 5/5 LE bilaterally. Neuro: Distal pulses 2+. Sensation to light touch intact in LE.  Vital signs in last 24 hours:    Labs:   Estimated body mass index is 30.42 kg/m as calculated from the following:   Height as of 12/28/22: 5\' 10"  (1.778 m).   Weight as of 12/28/22: 96.2 kg.   Imaging Review  Plain radiographs demonstrate severe degenerative joint disease of the left knee(s). The overall alignment isneutral. The bone quality appears to be adequate for age and reported activity level.      Assessment/Plan:  End stage arthritis, left knee   The patient history, physical examination, clinical judgment of the provider and imaging studies are consistent with end stage degenerative joint disease of the left knee(s) and total knee arthroplasty is deemed medically necessary. The treatment options including medical management, injection therapy arthroscopy and arthroplasty were discussed at length. The risks and benefits of total knee arthroplasty were presented and reviewed. The risks due to aseptic loosening, infection, stiffness, patella tracking problems, thromboembolic complications and other imponderables were discussed. The patient acknowledged the explanation, agreed to proceed with the plan and consent was signed. Patient is being admitted for inpatient treatment for surgery, pain control, PT, OT, prophylactic antibiotics, VTE prophylaxis, progressive ambulation and ADL's and discharge planning. The patient is planning to be discharged  home.     Patient's anticipated LOS is less than 2 midnights, meeting these requirements: - Younger than 77 - Lives within 1 hour of care - Has a  competent adult at home to recover with post-op recover - NO history of  - Chronic pain requiring opiods  - Diabetes  - Coronary Artery Disease  - Heart failure  - Heart attack  - Stroke  - DVT/VTE  - Cardiac arrhythmia  - Respiratory Failure/COPD  - Renal failure  - Anemia  - Advanced Liver disease  Rosalene Billings, PA-C Orthopedic Surgery [facility] [place] [phone]

## 2023-01-10 NOTE — Anesthesia Preprocedure Evaluation (Signed)
Anesthesia Evaluation  Patient identified by MRN, date of birth, ID band Patient awake    Reviewed: Allergy & Precautions, H&P , NPO status , Patient's Chart, lab work & pertinent test results  Airway Mallampati: II   Neck ROM: full    Dental   Pulmonary former smoker   breath sounds clear to auscultation       Cardiovascular hypertension, + Past MI  + dysrhythmias Atrial Fibrillation  Rhythm:regular Rate:Normal     Neuro/Psych    GI/Hepatic   Endo/Other    Renal/GU      Musculoskeletal  (+) Arthritis ,    Abdominal   Peds  Hematology   Anesthesia Other Findings   Reproductive/Obstetrics                             Anesthesia Physical Anesthesia Plan  ASA: 3  Anesthesia Plan: MAC and Spinal   Post-op Pain Management: Regional block*   Induction: Intravenous  PONV Risk Score and Plan: 2 and Ondansetron, Propofol infusion and Treatment may vary due to age or medical condition  Airway Management Planned: Simple Face Mask  Additional Equipment:   Intra-op Plan:   Post-operative Plan: Extubation in OR  Informed Consent: I have reviewed the patients History and Physical, chart, labs and discussed the procedure including the risks, benefits and alternatives for the proposed anesthesia with the patient or authorized representative who has indicated his/her understanding and acceptance.     Dental advisory given  Plan Discussed with: CRNA, Anesthesiologist and Surgeon  Anesthesia Plan Comments:        Anesthesia Quick Evaluation

## 2023-01-11 ENCOUNTER — Encounter (HOSPITAL_COMMUNITY): Payer: Self-pay | Admitting: Orthopedic Surgery

## 2023-01-11 DIAGNOSIS — I10 Essential (primary) hypertension: Secondary | ICD-10-CM | POA: Diagnosis not present

## 2023-01-11 DIAGNOSIS — Z96642 Presence of left artificial hip joint: Secondary | ICD-10-CM | POA: Diagnosis not present

## 2023-01-11 DIAGNOSIS — Z79899 Other long term (current) drug therapy: Secondary | ICD-10-CM | POA: Diagnosis not present

## 2023-01-11 DIAGNOSIS — Z7982 Long term (current) use of aspirin: Secondary | ICD-10-CM | POA: Diagnosis not present

## 2023-01-11 DIAGNOSIS — M1712 Unilateral primary osteoarthritis, left knee: Secondary | ICD-10-CM | POA: Diagnosis not present

## 2023-01-11 DIAGNOSIS — Z7901 Long term (current) use of anticoagulants: Secondary | ICD-10-CM | POA: Diagnosis not present

## 2023-01-11 DIAGNOSIS — I4891 Unspecified atrial fibrillation: Secondary | ICD-10-CM | POA: Diagnosis not present

## 2023-01-11 DIAGNOSIS — Z87891 Personal history of nicotine dependence: Secondary | ICD-10-CM | POA: Diagnosis not present

## 2023-01-11 DIAGNOSIS — I2510 Atherosclerotic heart disease of native coronary artery without angina pectoris: Secondary | ICD-10-CM | POA: Diagnosis not present

## 2023-01-11 LAB — CBC
HCT: 36.7 % — ABNORMAL LOW (ref 39.0–52.0)
Hemoglobin: 12.3 g/dL — ABNORMAL LOW (ref 13.0–17.0)
MCH: 29.8 pg (ref 26.0–34.0)
MCHC: 33.5 g/dL (ref 30.0–36.0)
MCV: 88.9 fL (ref 80.0–100.0)
Platelets: 145 10*3/uL — ABNORMAL LOW (ref 150–400)
RBC: 4.13 MIL/uL — ABNORMAL LOW (ref 4.22–5.81)
RDW: 13.9 % (ref 11.5–15.5)
WBC: 12.2 10*3/uL — ABNORMAL HIGH (ref 4.0–10.5)
nRBC: 0 % (ref 0.0–0.2)

## 2023-01-11 LAB — BASIC METABOLIC PANEL
Anion gap: 10 (ref 5–15)
BUN: 19 mg/dL (ref 8–23)
CO2: 23 mmol/L (ref 22–32)
Calcium: 9 mg/dL (ref 8.9–10.3)
Chloride: 104 mmol/L (ref 98–111)
Creatinine, Ser: 1.1 mg/dL (ref 0.61–1.24)
GFR, Estimated: >60 mL/min (ref >60)
Glucose, Bld: 130 mg/dL — ABNORMAL HIGH (ref 70–99)
Potassium: 4.3 mmol/L (ref 3.5–5.1)
Sodium: 137 mmol/L (ref 135–145)

## 2023-01-11 MED ORDER — POLYETHYLENE GLYCOL 3350 17 G PO PACK: 17.0000 g | PACK | Freq: Two times a day (BID) | ORAL | 0 refills | Status: DC

## 2023-01-11 MED ORDER — METHOCARBAMOL 500 MG PO TABS: 500.0000 mg | ORAL_TABLET | Freq: Four times a day (QID) | ORAL | 2 refills | Status: DC | PRN

## 2023-01-11 MED ORDER — ORAL CARE MOUTH RINSE: 15.0000 mL | OROMUCOSAL | Status: DC | PRN

## 2023-01-11 MED ORDER — OXYCODONE HCL 5 MG PO TABS: 5.0000 mg | ORAL_TABLET | ORAL | 0 refills | Status: DC | PRN

## 2023-01-11 NOTE — Plan of Care (Signed)

## 2023-01-11 NOTE — TOC Transition Note (Signed)
Transition of Care [facility]) - CM/SW Discharge Note  Patient Details  Name: Timothy Estes MRN: [id number] Date of Birth: 01/29/1951  [facility] CM/SW Contact:  Ewing Schlein, LCSW Phone Number: 01/11/2023, 12:29 PM  Clinical Narrative: Patient is expected to discharge home after working with PT. CSW spoke with patient to confirm discharge plan. Patient will go home with OPPT at [facility]. Patient has a rolling walker and cane at home, so there are no DME needs at this time. TOC signing off.    Final next level of care: OP Rehab Barriers to Discharge: No Barriers Identified  Patient Goals and CMS Choice Choice offered to / list presented to : NA  Discharge Plan and Services Additional resources added to the After Visit Summary for          DME Arranged: N/A DME Agency: NA  Social Determinants of Health (SDOH) Interventions SDOH Screenings   Food Insecurity: No Food Insecurity (01/11/2023)  Housing: Low Risk  (01/11/2023)  Transportation Needs: No Transportation Needs (01/11/2023)  Utilities: Not At Risk (01/11/2023)  Depression (PHQ2-9): Low Risk  (09/01/2022)  Financial Resource Strain: Low Risk  (11/02/2022)  Social Connections: Moderately Integrated (11/02/2022)  Stress: No Stress Concern Present (11/02/2022)  Tobacco Use: Medium Risk (01/10/2023)   Readmission Risk Interventions     No data to display

## 2023-01-11 NOTE — Progress Notes (Signed)
Subjective: 1 Day Post-Op Procedure(s) (LRB): TOTAL KNEE ARTHROPLASTY (Left) Patient reports pain as mild.   Patient seen in rounds for Dr. Charlann Boxer. Patient is well, and has had no acute complaints or problems. No acute events overnight. Foley catheter removed. Patient has not been up with PT yet.  We will start therapy today.   Objective: Vital signs in last 24 hours: Temp:  [96.6 F (35.9 C)-98.8 F (37.1 C)] 97.7 F (36.5 C) (08/21 0527) Pulse Rate:  [51-84] 64 (08/21 0527) Resp:  [10-20] 16 (08/21 0527) BP: (108-206)/(68-93) 134/78 (08/21 0527) SpO2:  [90 %-100 %] 96 % (08/21 0527) Weight:  [96.2 kg] 96.2 kg (08/20 1235)  Intake/Output from previous day:  Intake/Output Summary (Last 24 hours) at 01/11/2023 0745 Last data filed at 01/11/2023 0536 Gross per 24 hour  Intake 2313.48 ml  Output 1450 ml  Net 863.48 ml     Intake/Output this shift: No intake/output data recorded.  Labs: Recent Labs    01/11/23 0401  HGB 12.3*   Recent Labs    01/11/23 0401  WBC 12.2*  RBC 4.13*  HCT 36.7*  PLT 145*   Recent Labs    01/11/23 0401  NA 137  K 4.3  CL 104  CO2 23  BUN 19  CREATININE 1.10  GLUCOSE 130*  CALCIUM 9.0   No results for input(s): "LABPT", "INR" in the last 72 hours.  Exam: General - Patient is Alert and Oriented Extremity - Neurologically intact Sensation intact distally Intact pulses distally Dorsiflexion/Plantar flexion intact Dressing - dressing C/D/I Motor Function - intact, moving foot and toes well on exam.   Past Medical History:  Diagnosis Date   Anemia    Arthritis    Atrial fibrillation with RVR (HCC)    converted to NSR with IV dilt 10/12 - no coumadin due to need for Plavix and ASA   Cataract    Clotting disorder (HCC)    PE 2012   Colon polyps 10/2015   (non-adenomatous)   Coronary artery disease    LHC 03/17/11: Dx 30%, OM1 30%, OM2 30%, pRCA 30%, EF 40-45% with anterolat and apical HK   Diverticulosis 10/2015    noted on colonoscopy   Elevated LFTs    Fatty liver    Hematochezia    LIKEY FROM HEMORRHOIDAL BLEEDING; PT HAS H/O   Hyperlipidemia    Hypertension    Internal hemorrhoids 10/2015   noted on colonoscopy   NSTEMI (non-ST elevated myocardial infarction) (HCC)    2012-FLET THAT NON-ST-ELEVATION MI IS POSSIBLY SECONDARY TO CORONARY EMBOLUS FROM HIS AFIB   Syncope    in setting of AFib with RVR    Assessment/Plan: 1 Day Post-Op Procedure(s) (LRB): TOTAL KNEE ARTHROPLASTY (Left) Principal Problem:   S/P total knee arthroplasty, left  Estimated body mass index is 30.43 kg/m as calculated from the following:   Height as of this encounter: 5\' 10"  (1.778 m).   Weight as of this encounter: 96.2 kg. Advance diet Up with therapy D/C IV fluids   Patient's anticipated LOS is less than 2 midnights, meeting these requirements: - Younger than 49 - Lives within 1 hour of care - Has a competent adult at home to recover with post-op recover - NO history of  - Chronic pain requiring opiods  - Diabetes  - Coronary Artery Disease  - Heart failure  - Heart attack  - Stroke  - DVT/VTE  - Cardiac arrhythmia  - Respiratory Failure/COPD  - Renal failure  -  Anemia  - Advanced Liver disease     DVT Prophylaxis - Xarelto Weight bearing as tolerated.  Hgb stable at 12.3 this AM.  We discussed his foley catheter, which was a big issue for him last time. This has been removed, but there was some bleeding associated with it despite easy placement by one of our urology nurses.  Recommend lots of PO fluids today to flush any possible clots.   Plan is to go Home after hospital stay. Plan for discharge today following 1-2 sessions of PT as long as they are meeting their goals. Patient is scheduled for OPPT. Follow up in the office in 2 weeks.   Rosalene Billings, PA-C Orthopedic Surgery [phone] 01/11/2023, 7:45 AM

## 2023-01-11 NOTE — Evaluation (Signed)
Physical Therapy Evaluation Patient Details Name: Timothy Estes MRN: [id number] DOB: 1951/05/02 Today's Date: 01/11/2023  History of Present Illness  72 YO male S/P  LTKA on 01/10/23. PMH: afib, LTHA, syncope, NSTEMI  Clinical Impression  Pt admitted with above diagnosis.  Pt currently with functional limitations due to the deficits listed below (see PT Problem List). Pt will benefit from acute skilled PT to increase their independence and safety with mobility to allow discharge.      The patient is eager to ambulate. Reports pain is mild. Patient ambulated x 200' using RW. Patient  should meet PT goals for Dc after next PT visit today.      If plan is discharge home, recommend the following: A little help with walking and/or transfers;A little help with bathing/dressing/bathroom;Help with stairs or ramp for entrance;Assist for transportation   Can travel by private vehicle        Equipment Recommendations None recommended by PT  Recommendations for Other Services       Functional Status Assessment Patient has had a recent decline in their functional status and demonstrates the ability to make significant improvements in function in a reasonable and predictable amount of time.     Precautions / Restrictions Precautions Precautions: Fall;Knee Restrictions Weight Bearing Restrictions: No      Mobility  Bed Mobility Overal bed mobility: Needs Assistance Bed Mobility: Supine to Sit     Supine to sit: Supervision, Used rails, HOB elevated          Transfers Overall transfer level: Needs assistance Equipment used: Rolling walker (2 wheels) Transfers: Sit to/from Stand Sit to Stand: Min assist           General transfer comment: cues for hand and LLE position    Ambulation/Gait Ambulation/Gait assistance: Min assist Gait Distance (Feet): 200 Feet Assistive device: Rolling walker (2 wheels) Gait Pattern/deviations: Step-to pattern, Step-through pattern, Trunk flexed,  Antalgic Gait velocity: decr     General Gait Details: cues for sequence, cues for posture, has forward flezed posture(PTA)  Stairs            Wheelchair Mobility     Tilt Bed    Modified Rankin (Stroke Patients Only)       Balance Overall balance assessment: Mild deficits observed, not formally tested                                           Pertinent Vitals/Pain Pain Assessment Pain Assessment: 0-10 Pain Score: 3  Pain Location: Left knee Pain Descriptors / Indicators: Discomfort Pain Intervention(s): Monitored during session, Ice applied    Home Living Family/patient expects to be discharged to:: Private residence Living Arrangements: Spouse/significant other Available Help at Discharge: Family;Available 24 hours/day Type of Home: House Home Access: Level entry       Home Layout: One level Home Equipment: Cane - single Librarian, academic (2 wheels);Hand held shower head;Shower seat - built in;Grab bars - tub/shower      Prior Function Prior Level of Function : Independent/Modified Independent;Driving             Mobility Comments: uses cane  as needed       Extremity/Trunk Assessment   Upper Extremity Assessment Upper Extremity Assessment: Overall WFL for tasks assessed    Lower Extremity Assessment Lower Extremity Assessment: LLE deficits/detail LLE Deficits / Details: SLR, knee flexion 10- 60  Communication   Communication Communication: No apparent difficulties  Cognition Arousal: Alert Behavior During Therapy: WFL for tasks assessed/performed Overall Cognitive Status: Within Functional Limits for tasks assessed                                          General Comments      Exercises Total Joint Exercises Ankle Circles/Pumps: AROM Quad Sets: AROM, 10 reps, Supine Heel Slides: AAROM, Left, 10 reps Straight Leg Raises: AROM, Left, 10 reps Long Arc Quad: AROM, 10 reps, Left    Assessment/Plan    PT Assessment Patient needs continued PT services  PT Problem List Decreased strength;Decreased mobility;Pain;Decreased range of motion;Decreased knowledge of precautions       PT Treatment Interventions DME instruction;Therapeutic activities;Gait training;Functional mobility training;Therapeutic exercise;Patient/family education    PT Goals (Current goals can be found in the Care Plan section)  Acute Rehab PT Goals Patient Stated Goal: walk without pain PT Goal Formulation: With patient/family Time For Goal Achievement: 01/18/23 Potential to Achieve Goals: Good    Frequency 7X/week     Co-evaluation               AM-PAC PT "6 Clicks" Mobility  Outcome Measure Help needed turning from your back to your side while in a flat bed without using bedrails?: A Little Help needed moving from lying on your back to sitting on the side of a flat bed without using bedrails?: A Little Help needed moving to and from a bed to a chair (including a wheelchair)?: A Little Help needed standing up from a chair using your arms (e.g., wheelchair or bedside chair)?: A Little Help needed to walk in hospital room?: A Little Help needed climbing 3-5 steps with a railing? : A Little 6 Click Score: 18    End of Session Equipment Utilized During Treatment: Gait belt Activity Tolerance: Patient tolerated treatment well Patient left: in chair;with call bell/phone within reach;with family/visitor present;with chair alarm set Nurse Communication: Mobility status PT Visit Diagnosis: Unsteadiness on feet (R26.81);Difficulty in walking, not elsewhere classified (R26.2);Pain Pain - Right/Left: Left Pain - part of body: Knee    Time: 1008-1050 PT Time Calculation (min) (ACUTE ONLY): 42 min   Charges:   PT Evaluation $PT Eval Low Complexity: 1 Low PT Treatments $Gait Training: 8-22 mins $Therapeutic Exercise: 8-22 mins PT General Charges $$ ACUTE PT VISIT: 1 Visit          Blanchard Kelch PT Acute Rehabilitation Services Office [phone] Weekend [phone]   Rada Hay 01/11/2023, 12:37 PM

## 2023-01-11 NOTE — Progress Notes (Signed)
Physical Therapy Treatment Patient Details Name: Timothy Estes MRN: [id number] DOB: 1951/02/22 Today's Date: 01/11/2023   History of Present Illness 72 YO male S/P  LTKA on 01/10/23. PMH: afib, LTHA, syncope, NSTEMI    PT Comments  The patient ambulate 120', wife present. Patient  encouraged keeping Left knee straight when not ambulating.. The left knee appears tighter and is more flexed during gait. Not signs of buckling.    If plan is discharge home, recommend the following: A little help with walking and/or transfers;A little help with bathing/dressing/bathroom;Help with stairs or ramp for entrance;Assist for transportation   Can travel by private vehicle        Equipment Recommendations  None recommended by PT    Recommendations for Other Services       Precautions / Restrictions Precautions Precautions: Fall;Knee Restrictions Weight Bearing Restrictions: No     Mobility     Transfers Overall transfer level: Needs assistance Equipment used: Rolling walker (2 wheels) Transfers: Sit to/from Stand Sit to Stand: Min assist           General transfer comment: cues for hand and LLE position, steady assist, appears stiff from sitting    Ambulation/Gait Ambulation/Gait assistance: Min assist Gait Distance (Feet): 150 Feet Assistive device: Rolling walker (2 wheels) Gait Pattern/deviations: Step-to pattern, Step-through pattern, Trunk flexed, Antalgic Gait velocity: decr     General Gait Details: cues for sequence, cues for posture, has forward flexed posture(PTA), Left knee appears more flexed during ambulation, encouraged heel strike   Stairs             Wheelchair Mobility     Tilt Bed    Modified Rankin (Stroke Patients Only)       Balance Overall balance assessment: Mild deficits observed, not formally tested                                          Cognition Arousal: Alert Behavior During Therapy: WFL for tasks  assessed/performed Overall Cognitive Status: Within Functional Limits for tasks assessed                                          Exercises Total Joint Exercises Ankle Circles/Pumps: AROM Quad Sets: AROM, 10 reps, Supine Heel Slides: AAROM, Left, 10 reps Straight Leg Raises: AROM, Left, 10 reps Long Arc Quad: AROM, 10 reps, Left    General Comments        Pertinent Vitals/Pain Pain Assessment Pain Assessment: 0-10 Pain Score: 2  Pain Location: Left knee Pain Descriptors / Indicators: Sore Pain Intervention(s): Monitored during session    Home Living Family/patient expects to be discharged to:: Private residence Living Arrangements: Spouse/significant other Available Help at Discharge: Family;Available 24 hours/day Type of Home: House Home Access: Level entry       Home Layout: One level Home Equipment: Cane - single Librarian, academic (2 wheels);Hand held shower head;Shower seat - built in;Grab bars - tub/shower      Prior Function            PT Goals (current goals can now be found in the care plan section) Acute Rehab PT Goals Patient Stated Goal: walk without pain PT Goal Formulation: With patient/family Time For Goal Achievement: 01/18/23 Potential to Achieve Goals: Good Progress towards PT goals: Progressing  toward goals    Frequency    7X/week      PT Plan      Co-evaluation              AM-PAC PT "6 Clicks" Mobility   Outcome Measure  Help needed turning from your back to your side while in a flat bed without using bedrails?: A Little Help needed moving from lying on your back to sitting on the side of a flat bed without using bedrails?: A Little Help needed moving to and from a bed to a chair (including a wheelchair)?: A Little Help needed standing up from a chair using your arms (e.g., wheelchair or bedside chair)?: A Little Help needed to walk in hospital room?: A Little Help needed climbing 3-5 steps with a  railing? : A Little 6 Click Score: 18    End of Session Equipment Utilized During Treatment: Gait belt Activity Tolerance: Patient tolerated treatment well Patient left: in chair;with call bell/phone within reach;with family/visitor present;with chair alarm set Nurse Communication: Mobility status PT Visit Diagnosis: Unsteadiness on feet (R26.81);Difficulty in walking, not elsewhere classified (R26.2);Pain Pain - Right/Left: Left Pain - part of body: Knee     Time: 1343-1401 PT Time Calculation (min) (ACUTE ONLY): 18 min  Charges:    $Gait Training: 8-22 mins $PT General Charges $$ ACUTE PT VISIT: 1 Visit                     Blanchard Kelch PT Acute Rehabilitation Services Office [phone] Weekend pager-[phone]    Rada Hay 01/11/2023, 2:06 PM

## 2023-01-13 ENCOUNTER — Ambulatory Visit: Payer: Medicare PPO | Attending: Orthopedic Surgery | Admitting: Physical Therapy

## 2023-01-13 ENCOUNTER — Other Ambulatory Visit: Payer: Self-pay

## 2023-01-13 ENCOUNTER — Encounter: Payer: Self-pay | Admitting: Physical Therapy

## 2023-01-13 DIAGNOSIS — R262 Difficulty in walking, not elsewhere classified: Secondary | ICD-10-CM | POA: Insufficient documentation

## 2023-01-13 DIAGNOSIS — R600 Localized edema: Secondary | ICD-10-CM | POA: Insufficient documentation

## 2023-01-13 DIAGNOSIS — G8929 Other chronic pain: Secondary | ICD-10-CM | POA: Diagnosis not present

## 2023-01-13 DIAGNOSIS — M25562 Pain in left knee: Secondary | ICD-10-CM | POA: Insufficient documentation

## 2023-01-13 DIAGNOSIS — M25662 Stiffness of left knee, not elsewhere classified: Secondary | ICD-10-CM | POA: Diagnosis not present

## 2023-01-13 DIAGNOSIS — M6281 Muscle weakness (generalized): Secondary | ICD-10-CM | POA: Diagnosis not present

## 2023-01-13 NOTE — Therapy (Signed)
OUTPATIENT PHYSICAL THERAPY LOWER EXTREMITY EVALUATION   Patient Name: Timothy Estes MRN: [id number] DOB:29-Aug-1950, 72 y.o., male Today's Date: 01/13/2023  END OF SESSION:  PT End of Session - 01/13/23 1318     Visit Number 1    Number of Visits 12    Date for PT Re-Evaluation 02/24/23    Authorization Type FOTO.    PT Start Time 0900    PT Stop Time 0945    PT Time Calculation (min) 45 min    Activity Tolerance Patient tolerated treatment well    Behavior During Therapy WFL for tasks assessed/performed             Past Medical History:  Diagnosis Date   Anemia    Arthritis    Atrial fibrillation with RVR (HCC)    converted to NSR with IV dilt 10/12 - no coumadin due to need for Plavix and ASA   Cataract    Clotting disorder (HCC)    PE 2012   Colon polyps 10/2015   (non-adenomatous)   Coronary artery disease    LHC 03/17/11: Dx 30%, OM1 30%, OM2 30%, pRCA 30%, EF 40-45% with anterolat and apical HK   Diverticulosis 10/2015   noted on colonoscopy   Elevated LFTs    Fatty liver    Hematochezia    LIKEY FROM HEMORRHOIDAL BLEEDING; PT HAS H/O   Hyperlipidemia    Hypertension    Internal hemorrhoids 10/2015   noted on colonoscopy   NSTEMI (non-ST elevated myocardial infarction) (HCC)    2012-FLET THAT NON-ST-ELEVATION MI IS POSSIBLY SECONDARY TO CORONARY EMBOLUS FROM HIS AFIB   Syncope    in setting of AFib with RVR   Past Surgical History:  Procedure Laterality Date   CARDIAC CATHETERIZATION  03/18/11   cardiac event monitor  04/01/11   CATARACT EXTRACTION W/PHACO Left 09/18/2014   Procedure: CATARACT EXTRACTION PHACO AND INTRAOCULAR LENS PLACEMENT LEFT EYE;  Surgeon: Gemma Payor, MD;  Location: [facility];  Service: Ophthalmology;  Laterality: Left;  CDE 13.32   COLONOSCOPY     ESOPHAGOGASTRODUODENOSCOPY ENDOSCOPY     MOUTH SURGERY     TOTAL HIP ARTHROPLASTY Left 07/10/2018   Procedure: TOTAL HIP ARTHROPLASTY ANTERIOR APPROACH;  Surgeon: Durene Romans, MD;   Location: [facility];  Service: Orthopedics;  Laterality: Left;  70 mins   TOTAL KNEE ARTHROPLASTY Left 01/10/2023   Procedure: TOTAL KNEE ARTHROPLASTY;  Surgeon: Durene Romans, MD;  Location: [facility];  Service: Orthopedics;  Laterality: Left;   TRANSTHORACIC ECHOCARDIOGRAM  03/19/11   WISDOM TOOTH EXTRACTION     Patient Active Problem List   Diagnosis Date Noted   S/P total knee arthroplasty, left 01/10/2023   Obese 07/11/2018   S/P left THA 07/10/2018   Iron deficiency anemia 11/08/2017   Gout of knee 10/11/2016   Impaired fasting glucose 07/24/2014   Mixed hyperlipidemia 07/10/2012   Elevated LFTs 04/01/2011   NSTEMI (non-ST elevated myocardial infarction) (HCC) 04/01/2011   Atrial fibrillation (HCC) 04/01/2011   Essential hypertension 04/01/2011   HLD (hyperlipidemia) 04/01/2011   Nonobstructive CAD by Cath 02/2011 04/01/2011    REFERRING PROVIDER: Durene Romans MD  REFERRING DIAG: S/p left total knee replacement.  THERAPY DIAG:  Chronic pain of left knee - Plan: PT plan of care cert/re-cert  Stiffness of left knee, not elsewhere classified - Plan: PT plan of care cert/re-cert  Localized edema - Plan: PT plan of care cert/re-cert  Muscle weakness (generalized) - Plan: PT plan of care cert/re-cert  Difficulty  in walking, not elsewhere classified - Plan: PT plan of care cert/re-cert  Rationale for Evaluation and Treatment: Rehabilitation  ONSET DATE: 01/10/23.  SUBJECTIVE:   SUBJECTIVE STATEMENT: The patient presents to the clinic today s/p left total knee replacement performed on 01/10/23.  His resting pain-level is a 4/10.  He has an HEP from the hospital and it was encouraged to him and his wife that he be compliant to it.  He states that since the nerve block has worn off he has had increased difficulty moving his left LE.  Ice and medication helps decrease his pain and movement increases it.  PERTINENT HISTORY: Left THA. PAIN:  Are you having pain? Yes: NPRS scale:  4/10 Pain location: Left knee. Pain description: Sore, numb. Aggravating factors: As above. Relieving factors: As above.  PRECAUTIONS: Other: No ultrasound.  FALLS:  Has patient fallen in last 6 months? Yes. Number of falls 2.  LIVING ENVIRONMENT: Lives with: lives with their spouse Lives in: House/apartment Has following equipment at home: Dan Humphreys - 2 wheeled  OCCUPATION: Retired.  PLOF: Independent  PATIENT GOALS: Get around better without knee pain.  Play drums.  OBJECTIVE:    PATIENT SURVEYS:  FOTO    OBSERVATION: ACE wrap doffed.  Aquacel intact.  TED hose donned for patient.     EDEMA:  Circumferential: Left 4 cms > right.  PALPATION: C/o diffuse left knee pain currently.  LOWER EXTREMITY ROM:  In supine:  Left knee extension is -20 degrees and passive to -15 degrees (right knee is -10 degrees).  Flexion achieved to 75 degrees.  LOWER EXTREMITY MMT:  Patient unable to perform a left antigravity SLR.  Left hip flexion and abduction graded grossly at 2-/5.  He is unable to perform a left SAQ.  TRANSFERS:   Patient requires manual assist to transition from sit to supine and supine to sit.   GAIT: Patient using a FWW.  He had a lot of difficulty advancing his left LE and was essentially dragging/shuffling it forward.  TODAY'S TREATMENT:                                                                                                                              DATE: Nustep level 1 x 5 minutes f/b  left LE elevation and vasopneumatic x 15 minutes.  PATIENT EDUCATION:  Education details: Discussed compliance to HEP. Person educated: Patient and Spouse Education method: Explanation Education comprehension: verbalized understanding  HOME EXERCISE PROGRAM: As above.  ASSESSMENT:  CLINICAL IMPRESSION: The patient presents to OPPT s/p left total knee replacement performed on 01/10/23.  He is using a FWW but is essentially dragging his left LE to advance  forward.  He is currently unable to perform a left SAQ and SLR.  He demonstrates moderate edema.  His Aquacel is intact.  TED hose were donned for patient.  He is currently lacking left knee flexion and  extension.  Transitioning from sit to supine  to sit requires manual assistance to his left LE.  He had a HEP established in the hospital and discussed with patient and wife to be compliant to it.  Patient will benefit from skilled physical therapy intervention to address pain and deficits.  OBJECTIVE IMPAIRMENTS: Abnormal gait, decreased activity tolerance, decreased mobility, difficulty walking, decreased ROM, decreased strength, increased edema, and pain.   ACTIVITY LIMITATIONS: carrying, lifting, bending, standing, stairs, transfers, bed mobility, dressing, and locomotion level  PARTICIPATION LIMITATIONS: meal prep, cleaning, laundry, driving, community activity, and yard work  PERSONAL FACTORS: Time since onset of injury/illness/exacerbation are also affecting patient's functional outcome.   REHAB POTENTIAL: Excellent  CLINICAL DECISION MAKING: Stable/uncomplicated  EVALUATION COMPLEXITY: Low   GOALS:  SHORT TERM GOALS: Target date: 01/27/23  Ind with an initial HEP. Goal status: INITIAL  2.  Active left knee extension equal to right.  Goal status: INITIAL   LONG TERM GOALS: Target date: 02/24/23.  Ind with an advanced HEP.  Goal status: INITIAL  2.  Active left knee flexion to 115 degrees+ so the patient can perform functional tasks and do so with pain not > 2-3/10.  Goal status: INITIAL  3.  Increase left hip and knee strength to a solid 4+/5 to provide good stability for accomplishment of functional activities.  Goal status: INITIAL  4.  Perform a reciprocating stair gait with one railing with pain not > 2-3/10.  Goal status: INITIAL  5.  Return to playing the drums.  Goal status: INITIAL  6.  Walk in clinic 250 feet without assistive device with a normal gait  pattern. Goal status: INITIAL   PLAN:  PT FREQUENCY:  2-3 times a week.  PT DURATION: 4 weeks  PLANNED INTERVENTIONS: Therapeutic exercises, Therapeutic activity, Neuromuscular re-education, Gait training, Patient/Family education, Self Care, Electrical stimulation, Cryotherapy, Vasopneumatic device, and Manual therapy  PLAN FOR NEXT SESSION: Nustep, PROM, VMS to left quadriceps. Progress per TKA protocol.  LE elevation and vasopneumatic.   Brendi Mccarroll, [place], PT 01/13/2023, 1:59 PM

## 2023-01-14 NOTE — Discharge Summary (Signed)
Patient ID: Timothy Estes MRN: [id number] DOB/AGE: 08/05/1950 72 y.o.  Admit date: 01/10/2023 Discharge date: 01/11/2023  Admission Diagnoses:  Left knee osteoarthritis  Discharge Diagnoses:  Principal Problem:   S/P total knee arthroplasty, left   Past Medical History:  Diagnosis Date   Anemia    Arthritis    Atrial fibrillation with RVR (HCC)    converted to NSR with IV dilt 10/12 - no coumadin due to need for Plavix and ASA   Cataract    Clotting disorder (HCC)    PE 2012   Colon polyps 10/2015   (non-adenomatous)   Coronary artery disease    LHC 03/17/11: Dx 30%, OM1 30%, OM2 30%, pRCA 30%, EF 40-45% with anterolat and apical HK   Diverticulosis 10/2015   noted on colonoscopy   Elevated LFTs    Fatty liver    Hematochezia    LIKEY FROM HEMORRHOIDAL BLEEDING; PT HAS H/O   Hyperlipidemia    Hypertension    Internal hemorrhoids 10/2015   noted on colonoscopy   NSTEMI (non-ST elevated myocardial infarction) (HCC)    2012-FLET THAT NON-ST-ELEVATION MI IS POSSIBLY SECONDARY TO CORONARY EMBOLUS FROM HIS AFIB   Syncope    in setting of AFib with RVR    Surgeries: Procedure(s): TOTAL KNEE ARTHROPLASTY on 01/10/2023   Consultants:   Discharged Condition: Improved  Hospital Course: Timothy Estes is an 72 y.o. male who was admitted 01/10/2023 for operative treatment ofS/P total knee arthroplasty, left. Patient has severe unremitting pain that affects sleep, daily activities, and work/hobbies. After pre-op clearance the patient was taken to the operating room on 01/10/2023 and underwent  Procedure(s): TOTAL KNEE ARTHROPLASTY.    Patient was given perioperative antibiotics:  Anti-infectives (From admission, onward)    Start     Dose/Rate Route Frequency Ordered Stop   01/11/23 0600  ceFAZolin (ANCEF) IVPB 2g/100 mL premix        2 g 200 mL/hr over 30 Minutes Intravenous On call to O.R. 01/10/23 1203 01/10/23 1502   01/10/23 2100  ceFAZolin (ANCEF) IVPB 2g/100 mL premix         2 g 200 mL/hr over 30 Minutes Intravenous Every 6 hours 01/10/23 2025 01/11/23 0746   01/10/23 1215  vancomycin (VANCOCIN) IVPB 1000 mg/200 mL premix        1,000 mg 200 mL/hr over 60 Minutes Intravenous  Once 01/10/23 1204 01/10/23 1320        Patient was given sequential compression devices, early ambulation, and chemoprophylaxis to prevent DVT. Patient worked with PT and was meeting their goals regarding safe ambulation and transfers.  Patient benefited maximally from hospital stay and there were no complications.    Recent vital signs: No data found.   Recent laboratory studies: No results for input(s): "WBC", "HGB", "HCT", "PLT", "NA", "K", "CL", "CO2", "BUN", "CREATININE", "GLUCOSE", "INR", "CALCIUM" in the last 72 hours.  Invalid input(s): "PT", "2"   Discharge Medications:   Allergies as of 01/11/2023   No Known Allergies      Medication List     TAKE these medications    aspirin EC 81 MG tablet Take 81 mg by mouth at bedtime. Swallow whole.   chlorhexidine 4 % external liquid Commonly known as: HIBICLENS Apply 15 mLs (1 Application total) topically as directed for 30 doses. Use as directed daily for 5 days every other week for 6 weeks.   Coenzyme Q-10 100 MG capsule Take 100 mg by mouth in the morning.   ezetimibe 10  MG tablet Commonly known as: ZETIA TAKE 1 TABLET BY MOUTH EVERY DAY   methocarbamol 500 MG tablet Commonly known as: ROBAXIN Take 1 tablet (500 mg total) by mouth every 6 (six) hours as needed for muscle spasms.   metoprolol tartrate 25 MG tablet Commonly known as: LOPRESSOR Take 1 tablet (25 mg total) by mouth 2 (two) times daily.   mupirocin ointment 2 % Commonly known as: BACTROBAN Place 1 Application into the nose 2 (two) times daily for 60 doses. Use as directed 2 times daily for 5 days every other week for 6 weeks.   niacin 500 MG ER tablet Commonly known as: VITAMIN B3 TAKE 1 TABLET BY MOUTH AT BEDTIME.   nitroGLYCERIN 0.4 MG SL  tablet Commonly known as: NITROSTAT Place 1 tablet (0.4 mg total) under the tongue every 5 (five) minutes as needed for chest pain.   OMEGA-3 FATTY ACIDS PO Take 2,400 mg by mouth 2 (two) times daily.   oxyCODONE 5 MG immediate release tablet Commonly known as: Oxy IR/ROXICODONE Take 1 tablet (5 mg total) by mouth every 4 (four) hours as needed for severe pain.   polyethylene glycol 17 g packet Commonly known as: MIRALAX / GLYCOLAX Take 17 g by mouth 2 (two) times daily.   rosuvastatin 40 MG tablet Commonly known as: CRESTOR Take 1 tablet (40 mg total) by mouth daily.   VITAMIN D PO Take 1,000 Units by mouth in the morning.   Xarelto 20 MG Tabs tablet Generic drug: rivaroxaban TAKE 1 TABLET BY MOUTH DAILY WITH SUPPER               Discharge Care Instructions  (From admission, onward)           Start     Ordered   01/11/23 0000  Change dressing       Comments: Maintain surgical dressing until follow up in the clinic. If the edges start to pull up, may reinforce with tape. If the dressing is no longer working, may remove and cover with gauze and tape, but must keep the area dry and clean.  Call with any questions or concerns.   01/11/23 0748            Diagnostic Studies: No results found.  Disposition: Discharge disposition: 01-Home or Self Care       Discharge Instructions     Call MD / Call 911   Complete by: As directed    If you experience chest pain or shortness of breath, CALL 911 and be transported to the hospital emergency room.  If you develope a fever above 101 F, pus (white drainage) or increased drainage or redness at the wound, or calf pain, call your surgeon's office.   Change dressing   Complete by: As directed    Maintain surgical dressing until follow up in the clinic. If the edges start to pull up, may reinforce with tape. If the dressing is no longer working, may remove and cover with gauze and tape, but must keep the area dry and  clean.  Call with any questions or concerns.   Constipation Prevention   Complete by: As directed    Drink plenty of fluids.  Prune juice may be helpful.  You may use a stool softener, such as Colace (over the counter) 100 mg twice a day.  Use MiraLax (over the counter) for constipation as needed.   Diet - low sodium heart healthy   Complete by: As directed  Increase activity slowly as tolerated   Complete by: As directed    Weight bearing as tolerated with assist device (walker, cane, etc) as directed, use it as long as suggested by your surgeon or therapist, typically at least 4-6 weeks.   Post-operative opioid taper instructions:   Complete by: As directed    POST-OPERATIVE OPIOID TAPER INSTRUCTIONS: It is important to wean off of your opioid medication as soon as possible. If you do not need pain medication after your surgery it is ok to stop day one. Opioids include: Codeine, Hydrocodone(Norco, Vicodin), Oxycodone(Percocet, oxycontin) and hydromorphone amongst others.  Long term and even short term use of opiods can cause: Increased pain response Dependence Constipation Depression Respiratory depression And more.  Withdrawal symptoms can include Flu like symptoms Nausea, vomiting And more Techniques to manage these symptoms Hydrate well Eat regular healthy meals Stay active Use relaxation techniques(deep breathing, meditating, yoga) Do Not substitute Alcohol to help with tapering If you have been on opioids for less than two weeks and do not have pain than it is ok to stop all together.  Plan to wean off of opioids This plan should start within one week post op of your joint replacement. Maintain the same interval or time between taking each dose and first decrease the dose.  Cut the total daily intake of opioids by one tablet each day Next start to increase the time between doses. The last dose that should be eliminated is the evening dose.      TED hose   Complete  by: As directed    Use stockings (TED hose) for 2 weeks on both leg(s).  You may remove them at night for sleeping.        Follow-up Information     Durene Romans, MD. Go on 01/25/2023.   Specialty: Orthopedic Surgery Why: You are scheduled for first post op appt on Wednesday Sept 4 at 11:00am. Contact information: [address] [place] [address] [place] [address] [phone]                  Signed: Cassandria Anger 01/14/2023, 9:32 AM

## 2023-01-16 ENCOUNTER — Ambulatory Visit: Payer: Medicare PPO

## 2023-01-16 DIAGNOSIS — R262 Difficulty in walking, not elsewhere classified: Secondary | ICD-10-CM

## 2023-01-16 DIAGNOSIS — M6281 Muscle weakness (generalized): Secondary | ICD-10-CM | POA: Diagnosis not present

## 2023-01-16 DIAGNOSIS — G8929 Other chronic pain: Secondary | ICD-10-CM

## 2023-01-16 DIAGNOSIS — R600 Localized edema: Secondary | ICD-10-CM | POA: Diagnosis not present

## 2023-01-16 DIAGNOSIS — M25562 Pain in left knee: Secondary | ICD-10-CM | POA: Diagnosis not present

## 2023-01-16 DIAGNOSIS — M25662 Stiffness of left knee, not elsewhere classified: Secondary | ICD-10-CM

## 2023-01-16 NOTE — Therapy (Signed)
OUTPATIENT PHYSICAL THERAPY LOWER EXTREMITY TREATMENT   Patient Name: Timothy Estes MRN: [id number] DOB:08-26-1950, 72 y.o., male Today's Date: 01/16/2023  END OF SESSION:  PT End of Session - 01/16/23 0843     Visit Number 2    Number of Visits 12    Date for PT Re-Evaluation 02/24/23    Authorization Type FOTO.    PT Start Time 0845    PT Stop Time 0952    PT Time Calculation (min) 67 min    Activity Tolerance Patient tolerated treatment well    Behavior During Therapy WFL for tasks assessed/performed              Past Medical History:  Diagnosis Date   Anemia    Arthritis    Atrial fibrillation with RVR (HCC)    converted to NSR with IV dilt 10/12 - no coumadin due to need for Plavix and ASA   Cataract    Clotting disorder (HCC)    PE 2012   Colon polyps 10/2015   (non-adenomatous)   Coronary artery disease    LHC 03/17/11: Dx 30%, OM1 30%, OM2 30%, pRCA 30%, EF 40-45% with anterolat and apical HK   Diverticulosis 10/2015   noted on colonoscopy   Elevated LFTs    Fatty liver    Hematochezia    LIKEY FROM HEMORRHOIDAL BLEEDING; PT HAS H/O   Hyperlipidemia    Hypertension    Internal hemorrhoids 10/2015   noted on colonoscopy   NSTEMI (non-ST elevated myocardial infarction) (HCC)    2012-FLET THAT NON-ST-ELEVATION MI IS POSSIBLY SECONDARY TO CORONARY EMBOLUS FROM HIS AFIB   Syncope    in setting of AFib with RVR   Past Surgical History:  Procedure Laterality Date   CARDIAC CATHETERIZATION  03/18/11   cardiac event monitor  04/01/11   CATARACT EXTRACTION W/PHACO Left 09/18/2014   Procedure: CATARACT EXTRACTION PHACO AND INTRAOCULAR LENS PLACEMENT LEFT EYE;  Surgeon: Gemma Payor, MD;  Location: [facility];  Service: Ophthalmology;  Laterality: Left;  CDE 13.32   COLONOSCOPY     ESOPHAGOGASTRODUODENOSCOPY ENDOSCOPY     MOUTH SURGERY     TOTAL HIP ARTHROPLASTY Left 07/10/2018   Procedure: TOTAL HIP ARTHROPLASTY ANTERIOR APPROACH;  Surgeon: Durene Romans, MD;   Location: [facility];  Service: Orthopedics;  Laterality: Left;  70 mins   TOTAL KNEE ARTHROPLASTY Left 01/10/2023   Procedure: TOTAL KNEE ARTHROPLASTY;  Surgeon: Durene Romans, MD;  Location: [facility];  Service: Orthopedics;  Laterality: Left;   TRANSTHORACIC ECHOCARDIOGRAM  03/19/11   WISDOM TOOTH EXTRACTION     Patient Active Problem List   Diagnosis Date Noted   S/P total knee arthroplasty, left 01/10/2023   Obese 07/11/2018   S/P left THA 07/10/2018   Iron deficiency anemia 11/08/2017   Gout of knee 10/11/2016   Impaired fasting glucose 07/24/2014   Mixed hyperlipidemia 07/10/2012   Elevated LFTs 04/01/2011   NSTEMI (non-ST elevated myocardial infarction) (HCC) 04/01/2011   Atrial fibrillation (HCC) 04/01/2011   Essential hypertension 04/01/2011   HLD (hyperlipidemia) 04/01/2011   Nonobstructive CAD by Cath 02/2011 04/01/2011    REFERRING PROVIDER: Durene Romans MD  REFERRING DIAG: S/p left total knee replacement.  THERAPY DIAG:  Chronic pain of left knee  Stiffness of left knee, not elsewhere classified  Localized edema  Muscle weakness (generalized)  Difficulty in walking, not elsewhere classified  Rationale for Evaluation and Treatment: Rehabilitation  ONSET DATE: 01/10/23.  SUBJECTIVE:   SUBJECTIVE STATEMENT: Patient reports that his knee  is sore and hurting a little today.   PERTINENT HISTORY: Left THA. PAIN:  Are you having pain? Yes: NPRS scale: 2-3/10 Pain location: Left knee. Pain description: Sore, numb. Aggravating factors: As above. Relieving factors: As above.  PRECAUTIONS: Other: No ultrasound.  FALLS:  Has patient fallen in last 6 months? Yes. Number of falls 2.  LIVING ENVIRONMENT: Lives with: lives with their spouse Lives in: House/apartment Has following equipment at home: Dan Humphreys - 2 wheeled  OCCUPATION: Retired.  PLOF: Independent  PATIENT GOALS: Get around better without knee pain.  Play drums.  OBJECTIVE: all objective measures  were assessed at his initial evaluation on 01/13/23 unless otherwise noted  PATIENT SURVEYS:  FOTO    OBSERVATION: ACE wrap doffed.  Aquacel intact.  TED hose donned for patient.     EDEMA:  Circumferential: Left 4 cms > right.  PALPATION: C/o diffuse left knee pain currently.  LOWER EXTREMITY ROM:  In supine:  Left knee extension is -20 degrees and passive to -15 degrees (right knee is -10 degrees).  Flexion achieved to 75 degrees.  LOWER EXTREMITY MMT:  Patient unable to perform a left antigravity SLR.  Left hip flexion and abduction graded grossly at 2-/5.  He is unable to perform a left SAQ.  TRANSFERS:   Patient requires manual assist to transition from sit to supine and supine to sit.   GAIT: Patient using a FWW.  He had a lot of difficulty advancing his left LE and was essentially dragging/shuffling it forward.  TODAY'S TREATMENT:                                                                                                                              DATE:                                    01/16/23 EXERCISE LOG  Exercise Repetitions and Resistance Comments  Nustep  L1 x 18 minutes; seat 13-12   Standing gastroc stretch  2 minutes   Supine quad set  2 minutes w/ 5 second hold    Supine heel slide  2.5 minutes   Transfers Into and out of his car     Blank cell = exercise not performed today Manual Therapy Soft Tissue Mobilization: left quadriceps, for reduced pain and tone Passive ROM: flexion and extension, to tolerance   Modalities  Date:  Vaso: Knee, 34 degrees; low pressure, 10 mins, Pain and Edema   PATIENT EDUCATION:  Education details: walking, edema, healing, and getting into and out of the car Person educated: Patient and Spouse Education method: Explanation Education comprehension: verbalized understanding  HOME EXERCISE PROGRAM: As above.  ASSESSMENT:  CLINICAL IMPRESSION: Patient was introduced to multiple new interventions for  improved knee mobility. He required minimal cueing with quad sets for proper exercise performance to facilitate quadriceps engagement. Manual therapy focused on soft  tissue mobilization to his quadriceps followed by passive range of motion for improved knee mobility. He reported no significant increase in pain or discomfort with any of today's interventions. He reported that his knee felt good upon the conclusion of treatment. He and his wife were educated on car transfers for improved ease entering and exiting his vehicle. He continues to require skilled physical therapy to address his remaining impairments to return to his prior level of function.   OBJECTIVE IMPAIRMENTS: Abnormal gait, decreased activity tolerance, decreased mobility, difficulty walking, decreased ROM, decreased strength, increased edema, and pain.   ACTIVITY LIMITATIONS: carrying, lifting, bending, standing, stairs, transfers, bed mobility, dressing, and locomotion level  PARTICIPATION LIMITATIONS: meal prep, cleaning, laundry, driving, community activity, and yard work  PERSONAL FACTORS: Time since onset of injury/illness/exacerbation are also affecting patient's functional outcome.   REHAB POTENTIAL: Excellent  CLINICAL DECISION MAKING: Stable/uncomplicated  EVALUATION COMPLEXITY: Low   GOALS:  SHORT TERM GOALS: Target date: 01/27/23  Ind with an initial HEP. Goal status: INITIAL  2.  Active left knee extension equal to right.  Goal status: INITIAL   LONG TERM GOALS: Target date: 02/24/23.  Ind with an advanced HEP.  Goal status: INITIAL  2.  Active left knee flexion to 115 degrees+ so the patient can perform functional tasks and do so with pain not > 2-3/10.  Goal status: INITIAL  3.  Increase left hip and knee strength to a solid 4+/5 to provide good stability for accomplishment of functional activities.  Goal status: INITIAL  4.  Perform a reciprocating stair gait with one railing with pain not > 2-3/10.   Goal status: INITIAL  5.  Return to playing the drums.  Goal status: INITIAL  6.  Walk in clinic 250 feet without assistive device with a normal gait pattern. Goal status: INITIAL   PLAN:  PT FREQUENCY:  2-3 times a week.  PT DURATION: 4 weeks  PLANNED INTERVENTIONS: Therapeutic exercises, Therapeutic activity, Neuromuscular re-education, Gait training, Patient/Family education, Self Care, Electrical stimulation, Cryotherapy, Vasopneumatic device, and Manual therapy  PLAN FOR NEXT SESSION: Nustep, PROM, VMS to left quadriceps. Progress per TKA protocol.  LE elevation and vasopneumatic.   Granville Lewis, PT 01/16/2023, 1:12 PM

## 2023-01-18 ENCOUNTER — Encounter: Payer: Self-pay | Admitting: Physical Therapy

## 2023-01-18 ENCOUNTER — Ambulatory Visit: Payer: Medicare PPO | Admitting: Physical Therapy

## 2023-01-18 DIAGNOSIS — M25562 Pain in left knee: Secondary | ICD-10-CM | POA: Diagnosis not present

## 2023-01-18 DIAGNOSIS — R262 Difficulty in walking, not elsewhere classified: Secondary | ICD-10-CM | POA: Diagnosis not present

## 2023-01-18 DIAGNOSIS — M6281 Muscle weakness (generalized): Secondary | ICD-10-CM | POA: Diagnosis not present

## 2023-01-18 DIAGNOSIS — R600 Localized edema: Secondary | ICD-10-CM | POA: Diagnosis not present

## 2023-01-18 DIAGNOSIS — G8929 Other chronic pain: Secondary | ICD-10-CM

## 2023-01-18 DIAGNOSIS — M25662 Stiffness of left knee, not elsewhere classified: Secondary | ICD-10-CM | POA: Diagnosis not present

## 2023-01-18 NOTE — Therapy (Signed)
OUTPATIENT PHYSICAL THERAPY LOWER EXTREMITY TREATMENT   Patient Name: Timothy Estes MRN: [id number] DOB:1950-07-03, 71 y.o., male Today's Date: 01/18/2023  END OF SESSION:  PT End of Session - 01/18/23 1355     Visit Number 3    Number of Visits 12    Date for PT Re-Evaluation 02/24/23    Authorization Type FOTO.    PT Start Time 0100    PT Stop Time 0157    PT Time Calculation (min) 57 min    Activity Tolerance Patient tolerated treatment well    Behavior During Therapy WFL for tasks assessed/performed               Past Medical History:  Diagnosis Date   Anemia    Arthritis    Atrial fibrillation with RVR (HCC)    converted to NSR with IV dilt 10/12 - no coumadin due to need for Plavix and ASA   Cataract    Clotting disorder (HCC)    PE 2012   Colon polyps 10/2015   (non-adenomatous)   Coronary artery disease    LHC 03/17/11: Dx 30%, OM1 30%, OM2 30%, pRCA 30%, EF 40-45% with anterolat and apical HK   Diverticulosis 10/2015   noted on colonoscopy   Elevated LFTs    Fatty liver    Hematochezia    LIKEY FROM HEMORRHOIDAL BLEEDING; PT HAS H/O   Hyperlipidemia    Hypertension    Internal hemorrhoids 10/2015   noted on colonoscopy   NSTEMI (non-ST elevated myocardial infarction) (HCC)    2012-FLET THAT NON-ST-ELEVATION MI IS POSSIBLY SECONDARY TO CORONARY EMBOLUS FROM HIS AFIB   Syncope    in setting of AFib with RVR   Past Surgical History:  Procedure Laterality Date   CARDIAC CATHETERIZATION  03/18/11   cardiac event monitor  04/01/11   CATARACT EXTRACTION W/PHACO Left 09/18/2014   Procedure: CATARACT EXTRACTION PHACO AND INTRAOCULAR LENS PLACEMENT LEFT EYE;  Surgeon: Gemma Payor, MD;  Location: [facility];  Service: Ophthalmology;  Laterality: Left;  CDE 13.32   COLONOSCOPY     ESOPHAGOGASTRODUODENOSCOPY ENDOSCOPY     MOUTH SURGERY     TOTAL HIP ARTHROPLASTY Left 07/10/2018   Procedure: TOTAL HIP ARTHROPLASTY ANTERIOR APPROACH;  Surgeon: Durene Romans, MD;   Location: [facility];  Service: Orthopedics;  Laterality: Left;  70 mins   TOTAL KNEE ARTHROPLASTY Left 01/10/2023   Procedure: TOTAL KNEE ARTHROPLASTY;  Surgeon: Durene Romans, MD;  Location: [facility];  Service: Orthopedics;  Laterality: Left;   TRANSTHORACIC ECHOCARDIOGRAM  03/19/11   WISDOM TOOTH EXTRACTION     Patient Active Problem List   Diagnosis Date Noted   S/P total knee arthroplasty, left 01/10/2023   Obese 07/11/2018   S/P left THA 07/10/2018   Iron deficiency anemia 11/08/2017   Gout of knee 10/11/2016   Impaired fasting glucose 07/24/2014   Mixed hyperlipidemia 07/10/2012   Elevated LFTs 04/01/2011   NSTEMI (non-ST elevated myocardial infarction) (HCC) 04/01/2011   Atrial fibrillation (HCC) 04/01/2011   Essential hypertension 04/01/2011   HLD (hyperlipidemia) 04/01/2011   Nonobstructive CAD by Cath 02/2011 04/01/2011    REFERRING PROVIDER: Durene Romans MD  REFERRING DIAG: S/p left total knee replacement.  THERAPY DIAG:  Chronic pain of left knee  Stiffness of left knee, not elsewhere classified  Localized edema  Muscle weakness (generalized)  Difficulty in walking, not elsewhere classified  Rationale for Evaluation and Treatment: Rehabilitation  ONSET DATE: 01/10/23.  SUBJECTIVE:   SUBJECTIVE STATEMENT: Patient reports that his  knee is sore and hurting a little today.   PERTINENT HISTORY: Left THA. PAIN:  Are you having pain? Yes: NPRS scale: 2-3/10 Pain location: Left knee. Pain description: Sore, numb. Aggravating factors: As above. Relieving factors: As above.  PRECAUTIONS: Other: No ultrasound.  FALLS:  Has patient fallen in last 6 months? Yes. Number of falls 2.  LIVING ENVIRONMENT: Lives with: lives with their spouse Lives in: House/apartment Has following equipment at home: Dan Humphreys - 2 wheeled  OCCUPATION: Retired.  PLOF: Independent  PATIENT GOALS: Get around better without knee pain.  Play drums.  OBJECTIVE: all objective measures  were assessed at his initial evaluation on 01/13/23 unless otherwise noted  PATIENT SURVEYS:  FOTO    OBSERVATION: ACE wrap doffed.  Aquacel intact.  TED hose donned for patient.     EDEMA:  Circumferential: Left 4 cms > right.  PALPATION: C/o diffuse left knee pain currently.  LOWER EXTREMITY ROM:  In supine:  Left knee extension is -20 degrees and passive to -15 degrees (right knee is -10 degrees).  Flexion achieved to 75 degrees.  LOWER EXTREMITY MMT:  Patient unable to perform a left antigravity SLR.  Left hip flexion and abduction graded grossly at 2-/5.  He is unable to perform a left SAQ.  TRANSFERS:   Patient requires manual assist to transition from sit to supine and supine to sit.   GAIT: Patient using a FWW.  He had a lot of difficulty advancing his left LE and was essentially dragging/shuffling it forward.  TODAY'S TREATMENT:                                                                                                                              DATE:                                    01/16/23 EXERCISE LOG  Exercise Repetitions and Resistance Comments  Nustep  L1 x 18 minutes; seat 13-12   4 inch step ups. Left LE x 2 minutes.   Assisted non-resisted SAQ's 3 minutes.           In supine:  Low load long duration stretching into flexion and extension x 10 minutes f/b  LE elevation and vasopneumatic x 15 minutes.  PATIENT EDUCATION:  Education details: walking, edema, healing, and getting into and out of the car Person educated: Patient and Spouse Education method: Explanation Education comprehension: verbalized understanding  HOME EXERCISE PROGRAM: As above.  ASSESSMENT:  CLINICAL IMPRESSION: Patient noting improvement stating he is getting in and out of the car easier.  Added 4 inch step-ups today.  He did well though required cueing to avoid circumduction of the hip.  OBJECTIVE IMPAIRMENTS: Abnormal gait, decreased activity tolerance, decreased  mobility, difficulty walking, decreased ROM, decreased strength, increased edema, and pain.   ACTIVITY LIMITATIONS: carrying, lifting, bending, standing, stairs, transfers, bed  mobility, dressing, and locomotion level  PARTICIPATION LIMITATIONS: meal prep, cleaning, laundry, driving, community activity, and yard work  PERSONAL FACTORS: Time since onset of injury/illness/exacerbation are also affecting patient's functional outcome.   REHAB POTENTIAL: Excellent  CLINICAL DECISION MAKING: Stable/uncomplicated  EVALUATION COMPLEXITY: Low   GOALS:  SHORT TERM GOALS: Target date: 01/27/23  Ind with an initial HEP. Goal status: INITIAL  2.  Active left knee extension equal to right.  Goal status: INITIAL   LONG TERM GOALS: Target date: 02/24/23.  Ind with an advanced HEP.  Goal status: INITIAL  2.  Active left knee flexion to 115 degrees+ so the patient can perform functional tasks and do so with pain not > 2-3/10.  Goal status: INITIAL  3.  Increase left hip and knee strength to a solid 4+/5 to provide good stability for accomplishment of functional activities.  Goal status: INITIAL  4.  Perform a reciprocating stair gait with one railing with pain not > 2-3/10.  Goal status: INITIAL  5.  Return to playing the drums.  Goal status: INITIAL  6.  Walk in clinic 250 feet without assistive device with a normal gait pattern. Goal status: INITIAL   PLAN:  PT FREQUENCY:  2-3 times a week.  PT DURATION: 4 weeks  PLANNED INTERVENTIONS: Therapeutic exercises, Therapeutic activity, Neuromuscular re-education, Gait training, Patient/Family education, Self Care, Electrical stimulation, Cryotherapy, Vasopneumatic device, and Manual therapy  PLAN FOR NEXT SESSION: Nustep, PROM, VMS to left quadriceps. Progress per TKA protocol.  LE elevation and vasopneumatic.   Enzio Buchler, [place], PT 01/18/2023, 2:14 PM

## 2023-01-20 ENCOUNTER — Ambulatory Visit: Payer: Medicare PPO

## 2023-01-20 DIAGNOSIS — M25662 Stiffness of left knee, not elsewhere classified: Secondary | ICD-10-CM | POA: Diagnosis not present

## 2023-01-20 DIAGNOSIS — R262 Difficulty in walking, not elsewhere classified: Secondary | ICD-10-CM

## 2023-01-20 DIAGNOSIS — M6281 Muscle weakness (generalized): Secondary | ICD-10-CM

## 2023-01-20 DIAGNOSIS — M25562 Pain in left knee: Secondary | ICD-10-CM | POA: Diagnosis not present

## 2023-01-20 DIAGNOSIS — R600 Localized edema: Secondary | ICD-10-CM | POA: Diagnosis not present

## 2023-01-20 DIAGNOSIS — G8929 Other chronic pain: Secondary | ICD-10-CM

## 2023-01-20 NOTE — Therapy (Signed)
OUTPATIENT PHYSICAL THERAPY LOWER EXTREMITY TREATMENT   Patient Name: Timothy Estes MRN: [id number] DOB:Aug 17, 1950, 72 y.o., male Today's Date: 01/20/2023  END OF SESSION:  PT End of Session - 01/20/23 0934     Visit Number 4    Number of Visits 12    Date for PT Re-Evaluation 02/24/23    Authorization Type FOTO.    PT Start Time [phone]    PT Stop Time 1028    PT Time Calculation (min) 57 min    Activity Tolerance Patient tolerated treatment well    Behavior During Therapy WFL for tasks assessed/performed                Past Medical History:  Diagnosis Date   Anemia    Arthritis    Atrial fibrillation with RVR (HCC)    converted to NSR with IV dilt 10/12 - no coumadin due to need for Plavix and ASA   Cataract    Clotting disorder (HCC)    PE 2012   Colon polyps 10/2015   (non-adenomatous)   Coronary artery disease    LHC 03/17/11: Dx 30%, OM1 30%, OM2 30%, pRCA 30%, EF 40-45% with anterolat and apical HK   Diverticulosis 10/2015   noted on colonoscopy   Elevated LFTs    Fatty liver    Hematochezia    LIKEY FROM HEMORRHOIDAL BLEEDING; PT HAS H/O   Hyperlipidemia    Hypertension    Internal hemorrhoids 10/2015   noted on colonoscopy   NSTEMI (non-ST elevated myocardial infarction) (HCC)    2012-FLET THAT NON-ST-ELEVATION MI IS POSSIBLY SECONDARY TO CORONARY EMBOLUS FROM HIS AFIB   Syncope    in setting of AFib with RVR   Past Surgical History:  Procedure Laterality Date   CARDIAC CATHETERIZATION  03/18/11   cardiac event monitor  04/01/11   CATARACT EXTRACTION W/PHACO Left 09/18/2014   Procedure: CATARACT EXTRACTION PHACO AND INTRAOCULAR LENS PLACEMENT LEFT EYE;  Surgeon: Gemma Payor, MD;  Location: [facility];  Service: Ophthalmology;  Laterality: Left;  CDE 13.32   COLONOSCOPY     ESOPHAGOGASTRODUODENOSCOPY ENDOSCOPY     MOUTH SURGERY     TOTAL HIP ARTHROPLASTY Left 07/10/2018   Procedure: TOTAL HIP ARTHROPLASTY ANTERIOR APPROACH;  Surgeon: Durene Romans, MD;   Location: [facility];  Service: Orthopedics;  Laterality: Left;  70 mins   TOTAL KNEE ARTHROPLASTY Left 01/10/2023   Procedure: TOTAL KNEE ARTHROPLASTY;  Surgeon: Durene Romans, MD;  Location: [facility];  Service: Orthopedics;  Laterality: Left;   TRANSTHORACIC ECHOCARDIOGRAM  03/19/11   WISDOM TOOTH EXTRACTION     Patient Active Problem List   Diagnosis Date Noted   S/P total knee arthroplasty, left 01/10/2023   Obese 07/11/2018   S/P left THA 07/10/2018   Iron deficiency anemia 11/08/2017   Gout of knee 10/11/2016   Impaired fasting glucose 07/24/2014   Mixed hyperlipidemia 07/10/2012   Elevated LFTs 04/01/2011   NSTEMI (non-ST elevated myocardial infarction) (HCC) 04/01/2011   Atrial fibrillation (HCC) 04/01/2011   Essential hypertension 04/01/2011   HLD (hyperlipidemia) 04/01/2011   Nonobstructive CAD by Cath 02/2011 04/01/2011    REFERRING PROVIDER: Durene Romans MD  REFERRING DIAG: S/p left total knee replacement.  THERAPY DIAG:  Chronic pain of left knee  Stiffness of left knee, not elsewhere classified  Localized edema  Muscle weakness (generalized)  Difficulty in walking, not elsewhere classified  Rationale for Evaluation and Treatment: Rehabilitation  ONSET DATE: 01/10/23.  SUBJECTIVE:   SUBJECTIVE STATEMENT: Patient reports that  he did a lot of moving around yesterday and his knee is a little more stiff and hurting today.   PERTINENT HISTORY: Left THA. PAIN:  Are you having pain? Yes: NPRS scale: 3/10 Pain location: Left knee. Pain description: Sore, numb. Aggravating factors: As above. Relieving factors: As above.  PRECAUTIONS: Other: No ultrasound.  FALLS:  Has patient fallen in last 6 months? Yes. Number of falls 2.  LIVING ENVIRONMENT: Lives with: lives with their spouse Lives in: House/apartment Has following equipment at home: Dan Humphreys - 2 wheeled  OCCUPATION: Retired.  PLOF: Independent  PATIENT GOALS: Get around better without knee pain.   Play drums.  OBJECTIVE: all objective measures were assessed at his initial evaluation on 01/13/23 unless otherwise noted  PATIENT SURVEYS:  FOTO    OBSERVATION: ACE wrap doffed.  Aquacel intact.  TED hose donned for patient.     EDEMA:  Circumferential: Left 4 cms > right.  PALPATION: C/o diffuse left knee pain currently.  LOWER EXTREMITY ROM:  In supine:  Left knee extension is -20 degrees and passive to -15 degrees (right knee is -10 degrees).  Flexion achieved to 75 degrees.  LOWER EXTREMITY MMT:  Patient unable to perform a left antigravity SLR.  Left hip flexion and abduction graded grossly at 2-/5.  He is unable to perform a left SAQ.  TRANSFERS:   Patient requires manual assist to transition from sit to supine and supine to sit.   GAIT: Patient using a FWW.  He had a lot of difficulty advancing his left LE and was essentially dragging/shuffling it forward.  TODAY'S TREATMENT:                                                                                                                              DATE:                                    01/20/23 EXERCISE LOG  Exercise Repetitions and Resistance Comments  Nustep  L3 x 16 minutes; seat 10   Standing gastroc stretch  2.5 minutes   Lunges onto step  6" step x 2 minutes LLE on step   Standing HS stretch  2 minutes  LLE on 6" step        Blank cell = exercise not performed today  Manual Therapy Soft Tissue Mobilization: left quadriceps and hamstring, for improved soft tissue extensibility Passive ROM: flexion and extension, to tolerance   Modalities: no redness or adverse reactions to today's modalities  Date:  Vaso: Knee, 34 degrees; low pressure, 15 mins, Pain and Edema                                   01/16/23 EXERCISE LOG  Exercise Repetitions and Resistance Comments  Nustep  L1 x 18 minutes;  seat 13-12   4 inch step ups. Left LE x 2 minutes.   Assisted non-resisted SAQ's 3 minutes.           In  supine:  Low load long duration stretching into flexion and extension x 10 minutes f/b  LE elevation and vasopneumatic x 15 minutes.  PATIENT EDUCATION:  Education details: walking, edema, healing, and getting into and out of the car Person educated: Patient and Spouse Education method: Explanation Education comprehension: verbalized understanding  HOME EXERCISE PROGRAM: As above.  ASSESSMENT:  CLINICAL IMPRESSION: Patient was introduced to multiple new standing interventions for improved knee mobility. He required minimal cueing with today's new interventions for proper positioning to facilitate improved soft tissue extensibility. Manual therapy focused on improved knee mobility through the use of soft tissue mobilization to his left quadriceps and hamstrings followed by passive range of motion. He reported that his knee felt good upon the conclusion of treatment. He continues to require skilled physical therapy to address his remaining impairments to return to his prior level of function.   OBJECTIVE IMPAIRMENTS: Abnormal gait, decreased activity tolerance, decreased mobility, difficulty walking, decreased ROM, decreased strength, increased edema, and pain.   ACTIVITY LIMITATIONS: carrying, lifting, bending, standing, stairs, transfers, bed mobility, dressing, and locomotion level  PARTICIPATION LIMITATIONS: meal prep, cleaning, laundry, driving, community activity, and yard work  PERSONAL FACTORS: Time since onset of injury/illness/exacerbation are also affecting patient's functional outcome.   REHAB POTENTIAL: Excellent  CLINICAL DECISION MAKING: Stable/uncomplicated  EVALUATION COMPLEXITY: Low   GOALS:  SHORT TERM GOALS: Target date: 01/27/23  Ind with an initial HEP. Goal status: INITIAL  2.  Active left knee extension equal to right.  Goal status: INITIAL   LONG TERM GOALS: Target date: 02/24/23.  Ind with an advanced HEP.  Goal status: INITIAL  2.  Active left  knee flexion to 115 degrees+ so the patient can perform functional tasks and do so with pain not > 2-3/10.  Goal status: INITIAL  3.  Increase left hip and knee strength to a solid 4+/5 to provide good stability for accomplishment of functional activities.  Goal status: INITIAL  4.  Perform a reciprocating stair gait with one railing with pain not > 2-3/10.  Goal status: INITIAL  5.  Return to playing the drums.  Goal status: INITIAL  6.  Walk in clinic 250 feet without assistive device with a normal gait pattern. Goal status: INITIAL   PLAN:  PT FREQUENCY:  2-3 times a week.  PT DURATION: 4 weeks  PLANNED INTERVENTIONS: Therapeutic exercises, Therapeutic activity, Neuromuscular re-education, Gait training, Patient/Family education, Self Care, Electrical stimulation, Cryotherapy, Vasopneumatic device, and Manual therapy  PLAN FOR NEXT SESSION: Nustep, PROM, VMS to left quadriceps. Progress per TKA protocol.  LE elevation and vasopneumatic.   Granville Lewis, PT 01/20/2023, 1:00 PM

## 2023-01-24 ENCOUNTER — Encounter: Payer: Self-pay | Admitting: Physical Therapy

## 2023-01-24 ENCOUNTER — Ambulatory Visit: Payer: Medicare PPO | Attending: Orthopedic Surgery | Admitting: Physical Therapy

## 2023-01-24 DIAGNOSIS — M25562 Pain in left knee: Secondary | ICD-10-CM | POA: Diagnosis not present

## 2023-01-24 DIAGNOSIS — G8929 Other chronic pain: Secondary | ICD-10-CM | POA: Insufficient documentation

## 2023-01-24 DIAGNOSIS — M25662 Stiffness of left knee, not elsewhere classified: Secondary | ICD-10-CM | POA: Diagnosis not present

## 2023-01-24 DIAGNOSIS — R262 Difficulty in walking, not elsewhere classified: Secondary | ICD-10-CM | POA: Insufficient documentation

## 2023-01-24 DIAGNOSIS — R600 Localized edema: Secondary | ICD-10-CM | POA: Diagnosis not present

## 2023-01-24 DIAGNOSIS — M6281 Muscle weakness (generalized): Secondary | ICD-10-CM | POA: Diagnosis not present

## 2023-01-24 NOTE — Therapy (Signed)
OUTPATIENT PHYSICAL THERAPY LOWER EXTREMITY TREATMENT   Patient Name: Timothy Estes MRN: [id number] DOB:1951-01-24, 72 y.o., male Today's Date: 01/24/2023  END OF SESSION:  PT End of Session - 01/24/23 1554     Visit Number 5    Number of Visits 12    Date for PT Re-Evaluation 02/24/23    Authorization Type FOTO.    PT Start Time 0315    PT Stop Time 0408    PT Time Calculation (min) 53 min    Activity Tolerance Patient tolerated treatment well    Behavior During Therapy WFL for tasks assessed/performed                Past Medical History:  Diagnosis Date   Anemia    Arthritis    Atrial fibrillation with RVR (HCC)    converted to NSR with IV dilt 10/12 - no coumadin due to need for Plavix and ASA   Cataract    Clotting disorder (HCC)    PE 2012   Colon polyps 10/2015   (non-adenomatous)   Coronary artery disease    LHC 03/17/11: Dx 30%, OM1 30%, OM2 30%, pRCA 30%, EF 40-45% with anterolat and apical HK   Diverticulosis 10/2015   noted on colonoscopy   Elevated LFTs    Fatty liver    Hematochezia    LIKEY FROM HEMORRHOIDAL BLEEDING; PT HAS H/O   Hyperlipidemia    Hypertension    Internal hemorrhoids 10/2015   noted on colonoscopy   NSTEMI (non-ST elevated myocardial infarction) (HCC)    2012-FLET THAT NON-ST-ELEVATION MI IS POSSIBLY SECONDARY TO CORONARY EMBOLUS FROM HIS AFIB   Syncope    in setting of AFib with RVR   Past Surgical History:  Procedure Laterality Date   CARDIAC CATHETERIZATION  03/18/11   cardiac event monitor  04/01/11   CATARACT EXTRACTION W/PHACO Left 09/18/2014   Procedure: CATARACT EXTRACTION PHACO AND INTRAOCULAR LENS PLACEMENT LEFT EYE;  Surgeon: Gemma Payor, MD;  Location: [facility];  Service: Ophthalmology;  Laterality: Left;  CDE 13.32   COLONOSCOPY     ESOPHAGOGASTRODUODENOSCOPY ENDOSCOPY     MOUTH SURGERY     TOTAL HIP ARTHROPLASTY Left 07/10/2018   Procedure: TOTAL HIP ARTHROPLASTY ANTERIOR APPROACH;  Surgeon: Durene Romans, MD;   Location: [facility];  Service: Orthopedics;  Laterality: Left;  70 mins   TOTAL KNEE ARTHROPLASTY Left 01/10/2023   Procedure: TOTAL KNEE ARTHROPLASTY;  Surgeon: Durene Romans, MD;  Location: [facility];  Service: Orthopedics;  Laterality: Left;   TRANSTHORACIC ECHOCARDIOGRAM  03/19/11   WISDOM TOOTH EXTRACTION     Patient Active Problem List   Diagnosis Date Noted   S/P total knee arthroplasty, left 01/10/2023   Obese 07/11/2018   S/P left THA 07/10/2018   Iron deficiency anemia 11/08/2017   Gout of knee 10/11/2016   Impaired fasting glucose 07/24/2014   Mixed hyperlipidemia 07/10/2012   Elevated LFTs 04/01/2011   NSTEMI (non-ST elevated myocardial infarction) (HCC) 04/01/2011   Atrial fibrillation (HCC) 04/01/2011   Essential hypertension 04/01/2011   HLD (hyperlipidemia) 04/01/2011   Nonobstructive CAD by Cath 02/2011 04/01/2011    REFERRING PROVIDER: Durene Romans MD  REFERRING DIAG: S/p left total knee replacement.  THERAPY DIAG:  Chronic pain of left knee  Stiffness of left knee, not elsewhere classified  Localized edema  Muscle weakness (generalized)  Difficulty in walking, not elsewhere classified  Rationale for Evaluation and Treatment: Rehabilitation  ONSET DATE: 01/10/23.  SUBJECTIVE:   SUBJECTIVE STATEMENT: Knee feels stiff  today.   PERTINENT HISTORY: Left THA. PAIN:  Are you having pain? Yes: NPRS scale: 3/10 Pain location: Left knee. Pain description: Sore, numb. Aggravating factors: As above. Relieving factors: As above.  PRECAUTIONS: Other: No ultrasound.  FALLS:  Has patient fallen in last 6 months? Yes. Number of falls 2.  LIVING ENVIRONMENT: Lives with: lives with their spouse Lives in: House/apartment Has following equipment at home: Dan Humphreys - 2 wheeled  OCCUPATION: Retired.  PLOF: Independent  PATIENT GOALS: Get around better without knee pain.  Play drums.  OBJECTIVE: all objective measures were assessed at his initial evaluation on  01/13/23 unless otherwise noted  PATIENT SURVEYS:  FOTO    OBSERVATION: ACE wrap doffed.  Aquacel intact.  TED hose donned for patient.     EDEMA:  Circumferential: Left 4 cms > right.  PALPATION: C/o diffuse left knee pain currently.  LOWER EXTREMITY ROM:  In supine:  Left knee extension is -20 degrees and passive to -15 degrees (right knee is -10 degrees).  Flexion achieved to 75 degrees.  LOWER EXTREMITY MMT:  Patient unable to perform a left antigravity SLR.  Left hip flexion and abduction graded grossly at 2-/5.  He is unable to perform a left SAQ.  TRANSFERS:   Patient requires manual assist to transition from sit to supine and supine to sit.   GAIT: Patient using a FWW.  He had a lot of difficulty advancing his left LE and was essentially dragging/shuffling it forward.  TODAY'S TREATMENT:                                                                                                                              DATE: 01/24/23:  Nustep level 3 x 18 minutes moving seat forward x 2 to increase flexion f/b PROM x 13 minutes (low load long duration stretching) into flexion and extension f/b LE elevation and vasopneumatic x 15 minutes to patient's left knee.                                      01/20/23 EXERCISE LOG  Exercise Repetitions and Resistance Comments  Nustep  L3 x 16 minutes; seat 10   Standing gastroc stretch  2.5 minutes   Lunges onto step  6" step x 2 minutes LLE on step   Standing HS stretch  2 minutes  LLE on 6" step        Blank cell = exercise not performed today  Manual Therapy Soft Tissue Mobilization: left quadriceps and hamstring, for improved soft tissue extensibility Passive ROM: flexion and extension, to tolerance   Modalities: no redness or adverse reactions to today's modalities   PATIENT EDUCATION:  Education details: walking, edema, healing, and getting into and out of the car Person educated: Patient and Spouse Education method:  Explanation Education comprehension: verbalized understanding  HOME EXERCISE PROGRAM: As above.  ASSESSMENT:  CLINICAL IMPRESSION: Treatment focused on range of motion due to c/o left knee stiffness.  Encouraged patient to work hard on his HEP.  Patient's flexion is improving nicely but he continues to lack extension.  OBJECTIVE IMPAIRMENTS: Abnormal gait, decreased activity tolerance, decreased mobility, difficulty walking, decreased ROM, decreased strength, increased edema, and pain.   ACTIVITY LIMITATIONS: carrying, lifting, bending, standing, stairs, transfers, bed mobility, dressing, and locomotion level  PARTICIPATION LIMITATIONS: meal prep, cleaning, laundry, driving, community activity, and yard work  PERSONAL FACTORS: Time since onset of injury/illness/exacerbation are also affecting patient's functional outcome.   REHAB POTENTIAL: Excellent  CLINICAL DECISION MAKING: Stable/uncomplicated  EVALUATION COMPLEXITY: Low   GOALS:  SHORT TERM GOALS: Target date: 01/27/23  Ind with an initial HEP. Goal status: INITIAL  2.  Active left knee extension equal to right.  Goal status: INITIAL   LONG TERM GOALS: Target date: 02/24/23.  Ind with an advanced HEP.  Goal status: INITIAL  2.  Active left knee flexion to 115 degrees+ so the patient can perform functional tasks and do so with pain not > 2-3/10.  Goal status: INITIAL  3.  Increase left hip and knee strength to a solid 4+/5 to provide good stability for accomplishment of functional activities.  Goal status: INITIAL  4.  Perform a reciprocating stair gait with one railing with pain not > 2-3/10.  Goal status: INITIAL  5.  Return to playing the drums.  Goal status: INITIAL  6.  Walk in clinic 250 feet without assistive device with a normal gait pattern. Goal status: INITIAL   PLAN:  PT FREQUENCY:  2-3 times a week.  PT DURATION: 4 weeks  PLANNED INTERVENTIONS: Therapeutic exercises, Therapeutic activity,  Neuromuscular re-education, Gait training, Patient/Family education, Self Care, Electrical stimulation, Cryotherapy, Vasopneumatic device, and Manual therapy  PLAN FOR NEXT SESSION: Nustep, PROM, VMS to left quadriceps. Progress per TKA protocol.  LE elevation and vasopneumatic.   Lourdes Manning, [place], PT 01/24/2023, 4:12 PM

## 2023-01-26 ENCOUNTER — Ambulatory Visit: Payer: Medicare PPO | Admitting: Physical Therapy

## 2023-01-26 ENCOUNTER — Other Ambulatory Visit: Payer: Self-pay | Admitting: Family Medicine

## 2023-01-26 ENCOUNTER — Encounter: Payer: Self-pay | Admitting: Physical Therapy

## 2023-01-26 DIAGNOSIS — R262 Difficulty in walking, not elsewhere classified: Secondary | ICD-10-CM

## 2023-01-26 DIAGNOSIS — M25662 Stiffness of left knee, not elsewhere classified: Secondary | ICD-10-CM

## 2023-01-26 DIAGNOSIS — R600 Localized edema: Secondary | ICD-10-CM | POA: Diagnosis not present

## 2023-01-26 DIAGNOSIS — G8929 Other chronic pain: Secondary | ICD-10-CM

## 2023-01-26 DIAGNOSIS — M6281 Muscle weakness (generalized): Secondary | ICD-10-CM | POA: Diagnosis not present

## 2023-01-26 DIAGNOSIS — M25562 Pain in left knee: Secondary | ICD-10-CM | POA: Diagnosis not present

## 2023-01-26 NOTE — Therapy (Signed)
OUTPATIENT PHYSICAL THERAPY LOWER EXTREMITY TREATMENT   Patient Name: Timothy Estes MRN: [id number] DOB:Nov 11, 1950, 72 y.o., male Today's Date: 01/26/2023  END OF SESSION:  PT End of Session - 01/26/23 1304     Visit Number 6    Number of Visits 12    Date for PT Re-Evaluation 02/24/23    Authorization Type FOTO.    PT Start Time 1302    PT Stop Time 1355    PT Time Calculation (min) 53 min    Equipment Utilized During Treatment Other (comment)   Rollator   Activity Tolerance Patient tolerated treatment well    Behavior During Therapy WFL for tasks assessed/performed            Past Medical History:  Diagnosis Date   Anemia    Arthritis    Atrial fibrillation with RVR (HCC)    converted to NSR with IV dilt 10/12 - no coumadin due to need for Plavix and ASA   Cataract    Clotting disorder (HCC)    PE 2012   Colon polyps 10/2015   (non-adenomatous)   Coronary artery disease    LHC 03/17/11: Dx 30%, OM1 30%, OM2 30%, pRCA 30%, EF 40-45% with anterolat and apical HK   Diverticulosis 10/2015   noted on colonoscopy   Elevated LFTs    Fatty liver    Hematochezia    LIKEY FROM HEMORRHOIDAL BLEEDING; PT HAS H/O   Hyperlipidemia    Hypertension    Internal hemorrhoids 10/2015   noted on colonoscopy   NSTEMI (non-ST elevated myocardial infarction) (HCC)    2012-FLET THAT NON-ST-ELEVATION MI IS POSSIBLY SECONDARY TO CORONARY EMBOLUS FROM HIS AFIB   Syncope    in setting of AFib with RVR   Past Surgical History:  Procedure Laterality Date   CARDIAC CATHETERIZATION  03/18/11   cardiac event monitor  04/01/11   CATARACT EXTRACTION W/PHACO Left 09/18/2014   Procedure: CATARACT EXTRACTION PHACO AND INTRAOCULAR LENS PLACEMENT LEFT EYE;  Surgeon: Gemma Payor, MD;  Location: [facility];  Service: Ophthalmology;  Laterality: Left;  CDE 13.32   COLONOSCOPY     ESOPHAGOGASTRODUODENOSCOPY ENDOSCOPY     MOUTH SURGERY     TOTAL HIP ARTHROPLASTY Left 07/10/2018   Procedure: TOTAL HIP  ARTHROPLASTY ANTERIOR APPROACH;  Surgeon: Durene Romans, MD;  Location: [facility];  Service: Orthopedics;  Laterality: Left;  70 mins   TOTAL KNEE ARTHROPLASTY Left 01/10/2023   Procedure: TOTAL KNEE ARTHROPLASTY;  Surgeon: Durene Romans, MD;  Location: [facility];  Service: Orthopedics;  Laterality: Left;   TRANSTHORACIC ECHOCARDIOGRAM  03/19/11   WISDOM TOOTH EXTRACTION     Patient Active Problem List   Diagnosis Date Noted   S/P total knee arthroplasty, left 01/10/2023   Obese 07/11/2018   S/P left THA 07/10/2018   Iron deficiency anemia 11/08/2017   Gout of knee 10/11/2016   Impaired fasting glucose 07/24/2014   Mixed hyperlipidemia 07/10/2012   Elevated LFTs 04/01/2011   NSTEMI (non-ST elevated myocardial infarction) (HCC) 04/01/2011   Atrial fibrillation (HCC) 04/01/2011   Essential hypertension 04/01/2011   HLD (hyperlipidemia) 04/01/2011   Nonobstructive CAD by Cath 02/2011 04/01/2011    REFERRING PROVIDER: Durene Romans MD  REFERRING DIAG: S/p left total knee replacement.  THERAPY DIAG:  Chronic pain of left knee  Stiffness of left knee, not elsewhere classified  Localized edema  Muscle weakness (generalized)  Difficulty in walking, not elsewhere classified  Rationale for Evaluation and Treatment: Rehabilitation  ONSET DATE: 01/10/23.  SUBJECTIVE:  SUBJECTIVE STATEMENT: Patient excited where postsurgical dressing was removed by MD. He states that MD was pleased with his progress. Only really having pain with movement.  PERTINENT HISTORY: Left THA.  PAIN:  Are you having pain? Yes: NPRS scale: 1-2/10 Pain location: Left knee. Pain description: Sore Aggravating factors: Knee ROM Relieving factors: Rest.  PRECAUTIONS: Other: No ultrasound.  FALLS:  Has patient fallen in last 6 months? Yes. Number of falls 2.  PATIENT GOALS: Get around better without knee pain.  Play drums.  OBJECTIVE: all objective measures were assessed at his initial evaluation on  01/13/23 unless otherwise noted  PATIENT SURVEYS:  FOTO    OBSERVATION: Aquacel removed by MD   EDEMA:  Circumferential: Left knee: 51.1 cm, Right knee: 44.2 cm  PALPATION: C/o diffuse left knee pain currently.  LOWER EXTREMITY ROM: In supine:  Left knee extension is -20 degrees and passive to -15 degrees (right knee is -10 degrees).  Flexion achieved to 75 degrees.  LOWER EXTREMITY MMT: Patient unable to perform a left antigravity SLR.  Left hip flexion and abduction graded grossly at 2-/5.  He is unable to perform a left SAQ.  TRANSFERS: Patient requires manual assist to transition from sit to supine and supine to sit.  GAIT:  Using rollator but L knee extension still greatly limited. None to minimal heel strike with gait.  TODAY'S TREATMENT:                                                                                                                              DATE: 01/26/23 EXERCISE LOG  Exercise Repetitions and Resistance Comments  Nustep L4 x18 min   Lunges 6" step x20 reps   Heel prop for ext X3 min       QS With VMS for NMR of L quad        Blank cell = exercise not performed today   Modalities  Date: 01/26/23 Unattended Estim: Knee, VMS 10/10, 300 usec, 5 sec ramp, 10 mins, Muscle Activation Vaso: Knee, Low, 10 mins, Pain and Edema  PATIENT EDUCATION:  Education details: walking, edema, healing, and getting into and out of the car Person educated: Patient and Spouse Education method: Explanation Education comprehension: verbalized understanding  HOME EXERCISE PROGRAM: As above.  ASSESSMENT:  CLINICAL IMPRESSION: Patient presented in clinic with reports that postsurgical bandage was removed. Complete lack of L quad activation noted throughout session with gait or therex activities. NMR with VMS completed to enhance quad activation. Upon observation, due to lack of quad activity patient was using more glute activation. Normal modalities response noted  following removal of the modalities.  OBJECTIVE IMPAIRMENTS: Abnormal gait, decreased activity tolerance, decreased mobility, difficulty walking, decreased ROM, decreased strength, increased edema, and pain.   ACTIVITY LIMITATIONS: carrying, lifting, bending, standing, stairs, transfers, bed mobility, dressing, and locomotion level  PARTICIPATION LIMITATIONS: meal prep, cleaning, laundry, driving, community activity, and yard work  PERSONAL FACTORS: Time since onset  of injury/illness/exacerbation are also affecting patient's functional outcome.   REHAB POTENTIAL: Excellent  CLINICAL DECISION MAKING: Stable/uncomplicated  EVALUATION COMPLEXITY: Low  GOALS:  SHORT TERM GOALS: Target date: 01/27/23  Ind with an initial HEP. Goal status: INITIAL  2.  Active left knee extension equal to right.  Goal status: INITIAL   LONG TERM GOALS: Target date: 02/24/23.  Ind with an advanced HEP.  Goal status: INITIAL  2.  Active left knee flexion to 115 degrees+ so the patient can perform functional tasks and do so with pain not > 2-3/10.  Goal status: INITIAL  3.  Increase left hip and knee strength to a solid 4+/5 to provide good stability for accomplishment of functional activities.  Goal status: INITIAL  4.  Perform a reciprocating stair gait with one railing with pain not > 2-3/10.  Goal status: INITIAL  5.  Return to playing the drums.  Goal status: INITIAL  6.  Walk in clinic 250 feet without assistive device with a normal gait pattern. Goal status: INITIAL  PLAN:  PT FREQUENCY:  2-3 times a week.  PT DURATION: 4 weeks  PLANNED INTERVENTIONS: Therapeutic exercises, Therapeutic activity, Neuromuscular re-education, Gait training, Patient/Family education, Self Care, Electrical stimulation, Cryotherapy, Vasopneumatic device, and Manual therapy  PLAN FOR NEXT SESSION: Nustep, PROM, VMS to left quadriceps. Progress per TKA protocol.  LE elevation and vasopneumatic.  Marvell Fuller, PTA 01/26/2023, 2:08 PM

## 2023-01-30 ENCOUNTER — Ambulatory Visit: Payer: Medicare PPO

## 2023-01-30 DIAGNOSIS — G8929 Other chronic pain: Secondary | ICD-10-CM

## 2023-01-30 DIAGNOSIS — R262 Difficulty in walking, not elsewhere classified: Secondary | ICD-10-CM

## 2023-01-30 DIAGNOSIS — M25562 Pain in left knee: Secondary | ICD-10-CM | POA: Diagnosis not present

## 2023-01-30 DIAGNOSIS — M25662 Stiffness of left knee, not elsewhere classified: Secondary | ICD-10-CM

## 2023-01-30 DIAGNOSIS — M6281 Muscle weakness (generalized): Secondary | ICD-10-CM

## 2023-01-30 DIAGNOSIS — R600 Localized edema: Secondary | ICD-10-CM

## 2023-01-30 NOTE — Therapy (Signed)
OUTPATIENT PHYSICAL THERAPY LOWER EXTREMITY TREATMENT   Patient Name: Timothy Estes MRN: [id number] DOB:1950/11/22, 72 y.o., male Today's Date: 01/30/2023  END OF SESSION:  PT End of Session - 01/30/23 0933     Visit Number 7    Number of Visits 12    Date for PT Re-Evaluation 02/24/23    Authorization Type FOTO.    PT Start Time [phone]    PT Stop Time 1020    PT Time Calculation (min) 52 min    Equipment Utilized During Treatment Other (comment)   Rollator   Activity Tolerance Patient tolerated treatment well    Behavior During Therapy WFL for tasks assessed/performed             Past Medical History:  Diagnosis Date   Anemia    Arthritis    Atrial fibrillation with RVR (HCC)    converted to NSR with IV dilt 10/12 - no coumadin due to need for Plavix and ASA   Cataract    Clotting disorder (HCC)    PE 2012   Colon polyps 10/2015   (non-adenomatous)   Coronary artery disease    LHC 03/17/11: Dx 30%, OM1 30%, OM2 30%, pRCA 30%, EF 40-45% with anterolat and apical HK   Diverticulosis 10/2015   noted on colonoscopy   Elevated LFTs    Fatty liver    Hematochezia    LIKEY FROM HEMORRHOIDAL BLEEDING; PT HAS H/O   Hyperlipidemia    Hypertension    Internal hemorrhoids 10/2015   noted on colonoscopy   NSTEMI (non-ST elevated myocardial infarction) (HCC)    2012-FLET THAT NON-ST-ELEVATION MI IS POSSIBLY SECONDARY TO CORONARY EMBOLUS FROM HIS AFIB   Syncope    in setting of AFib with RVR   Past Surgical History:  Procedure Laterality Date   CARDIAC CATHETERIZATION  03/18/11   cardiac event monitor  04/01/11   CATARACT EXTRACTION W/PHACO Left 09/18/2014   Procedure: CATARACT EXTRACTION PHACO AND INTRAOCULAR LENS PLACEMENT LEFT EYE;  Surgeon: Gemma Payor, MD;  Location: [facility];  Service: Ophthalmology;  Laterality: Left;  CDE 13.32   COLONOSCOPY     ESOPHAGOGASTRODUODENOSCOPY ENDOSCOPY     MOUTH SURGERY     TOTAL HIP ARTHROPLASTY Left 07/10/2018   Procedure: TOTAL HIP  ARTHROPLASTY ANTERIOR APPROACH;  Surgeon: Durene Romans, MD;  Location: [facility];  Service: Orthopedics;  Laterality: Left;  70 mins   TOTAL KNEE ARTHROPLASTY Left 01/10/2023   Procedure: TOTAL KNEE ARTHROPLASTY;  Surgeon: Durene Romans, MD;  Location: [facility];  Service: Orthopedics;  Laterality: Left;   TRANSTHORACIC ECHOCARDIOGRAM  03/19/11   WISDOM TOOTH EXTRACTION     Patient Active Problem List   Diagnosis Date Noted   S/P total knee arthroplasty, left 01/10/2023   Obese 07/11/2018   S/P left THA 07/10/2018   Iron deficiency anemia 11/08/2017   Gout of knee 10/11/2016   Impaired fasting glucose 07/24/2014   Mixed hyperlipidemia 07/10/2012   Elevated LFTs 04/01/2011   NSTEMI (non-ST elevated myocardial infarction) (HCC) 04/01/2011   Atrial fibrillation (HCC) 04/01/2011   Essential hypertension 04/01/2011   HLD (hyperlipidemia) 04/01/2011   Nonobstructive CAD by Cath 02/2011 04/01/2011    REFERRING PROVIDER: Durene Romans MD  REFERRING DIAG: S/p left total knee replacement.  THERAPY DIAG:  Chronic pain of left knee  Stiffness of left knee, not elsewhere classified  Localized edema  Muscle weakness (generalized)  Difficulty in walking, not elsewhere classified  Rationale for Evaluation and Treatment: Rehabilitation  ONSET DATE: 01/10/23.  SUBJECTIVE:   SUBJECTIVE STATEMENT: Patient reports that his knee is not hurting today, but it is a little sore. He did not have any problems over the weekend.   PERTINENT HISTORY: Left THA.  PAIN:  Are you having pain? Yes: NPRS scale: 0/10 Pain location: Left knee. Pain description: Sore Aggravating factors: Knee ROM Relieving factors: Rest.  PRECAUTIONS: Other: No ultrasound.  FALLS:  Has patient fallen in last 6 months? Yes. Number of falls 2.  PATIENT GOALS: Get around better without knee pain.  Play drums.  OBJECTIVE: all objective measures were assessed at his initial evaluation on 01/13/23 unless otherwise  noted  PATIENT SURVEYS:  FOTO    OBSERVATION: Aquacel removed by MD   EDEMA:  Circumferential: Left knee: 51.1 cm, Right knee: 44.2 cm  PALPATION: C/o diffuse left knee pain currently.  LOWER EXTREMITY ROM: In supine:  Left knee extension is -20 degrees and passive to -15 degrees (right knee is -10 degrees).  Flexion achieved to 75 degrees.  LOWER EXTREMITY MMT: Patient unable to perform a left antigravity SLR.  Left hip flexion and abduction graded grossly at 2-/5.  He is unable to perform a left SAQ.  TRANSFERS: Patient requires manual assist to transition from sit to supine and supine to sit.  GAIT:  Using rollator but L knee extension still greatly limited. None to minimal heel strike with gait.  TODAY'S TREATMENT:                                                                                                                              DATE:                                   01/30/23 EXERCISE LOG  Exercise Repetitions and Resistance Comments  Nustep  L4 x 17 minutes; seat 12-11   Supine quad set  3.5 minutes w/ 5 second hold  Added to HEP   Supine gastroc stretch  4 x 30 seconds LLE only; added to HEP   Supine HS stretch  4 x 30 seconds LLE only; added to HEP   Heel prop for knee extension  7 minutes  LLE only   SLR 10 reps    Heel prop for knee extension  5 minutes  LLE only with vasopnuematic device   Blank cell = exercise not performed today  Modalities: no adverse reaction to today's modality   Date:  Vaso: Knee, 34 degrees; low pressure, 5 mins, Pain and Edema   01/26/23 EXERCISE LOG  Exercise Repetitions and Resistance Comments  Nustep L4 x18 min   Lunges 6" step x20 reps   Heel prop for ext X3 min   QS With VMS for NMR of L quad        Blank cell = exercise not performed today   Modalities  Date: 01/26/23 Unattended Estim: Knee, VMS 10/10, 300 usec,  5 sec ramp, 10 mins, Muscle Activation Vaso: Knee, Low, 10 mins, Pain and Edema  PATIENT EDUCATION:   Education details: walking, edema, healing, and getting into and out of the car Person educated: Patient and Spouse Education method: Explanation Education comprehension: verbalized understanding  HOME EXERCISE PROGRAM: As above.  ASSESSMENT:  CLINICAL IMPRESSION: Today's interventions focused primarily on improved left knee extension for improved terminal knee extension during ambulation. Short and long arc quads were attempted, but he was unable to achieve sufficient quadriceps engagement to complete these interventions. This resulted in these interventions being modified to supine quad sets. He reported feeling alright upon the conclusion of treatment. He continues to require skilled physical therapy to address his remaining impairments to return to his prior level of function.    OBJECTIVE IMPAIRMENTS: Abnormal gait, decreased activity tolerance, decreased mobility, difficulty walking, decreased ROM, decreased strength, increased edema, and pain.   ACTIVITY LIMITATIONS: carrying, lifting, bending, standing, stairs, transfers, bed mobility, dressing, and locomotion level  PARTICIPATION LIMITATIONS: meal prep, cleaning, laundry, driving, community activity, and yard work  PERSONAL FACTORS: Time since onset of injury/illness/exacerbation are also affecting patient's functional outcome.   REHAB POTENTIAL: Excellent  CLINICAL DECISION MAKING: Stable/uncomplicated  EVALUATION COMPLEXITY: Low  GOALS:  SHORT TERM GOALS: Target date: 01/27/23  Ind with an initial HEP. Goal status: INITIAL  2.  Active left knee extension equal to right.  Goal status: INITIAL   LONG TERM GOALS: Target date: 02/24/23.  Ind with an advanced HEP.  Goal status: INITIAL  2.  Active left knee flexion to 115 degrees+ so the patient can perform functional tasks and do so with pain not > 2-3/10.  Goal status: INITIAL  3.  Increase left hip and knee strength to a solid 4+/5 to provide good stability for  accomplishment of functional activities.  Goal status: INITIAL  4.  Perform a reciprocating stair gait with one railing with pain not > 2-3/10.  Goal status: INITIAL  5.  Return to playing the drums.  Goal status: INITIAL  6.  Walk in clinic 250 feet without assistive device with a normal gait pattern. Goal status: INITIAL  PLAN:  PT FREQUENCY:  2-3 times a week.  PT DURATION: 4 weeks  PLANNED INTERVENTIONS: Therapeutic exercises, Therapeutic activity, Neuromuscular re-education, Gait training, Patient/Family education, Self Care, Electrical stimulation, Cryotherapy, Vasopneumatic device, and Manual therapy  PLAN FOR NEXT SESSION: Nustep, PROM, VMS to left quadriceps. Progress per TKA protocol.  LE elevation and vasopneumatic.  Granville Lewis, PT 01/30/2023, 12:34 PM

## 2023-02-02 ENCOUNTER — Ambulatory Visit: Payer: Medicare PPO | Admitting: Physical Therapy

## 2023-02-02 DIAGNOSIS — G8929 Other chronic pain: Secondary | ICD-10-CM | POA: Diagnosis not present

## 2023-02-02 DIAGNOSIS — M6281 Muscle weakness (generalized): Secondary | ICD-10-CM | POA: Diagnosis not present

## 2023-02-02 DIAGNOSIS — R262 Difficulty in walking, not elsewhere classified: Secondary | ICD-10-CM | POA: Diagnosis not present

## 2023-02-02 DIAGNOSIS — M25662 Stiffness of left knee, not elsewhere classified: Secondary | ICD-10-CM | POA: Diagnosis not present

## 2023-02-02 DIAGNOSIS — R600 Localized edema: Secondary | ICD-10-CM

## 2023-02-02 DIAGNOSIS — M25562 Pain in left knee: Secondary | ICD-10-CM | POA: Diagnosis not present

## 2023-02-02 NOTE — Therapy (Signed)
OUTPATIENT PHYSICAL THERAPY LOWER EXTREMITY TREATMENT   Patient Name: Timothy Estes MRN: [id number] DOB:02-28-1951, 71 y.o., male Today's Date: 02/02/2023  END OF SESSION:  PT End of Session - 02/02/23 0952     Visit Number 8    Number of Visits 12    Date for PT Re-Evaluation 02/24/23    Authorization Type FOTO.    PT Start Time [phone]    PT Stop Time 1021    PT Time Calculation (min) 56 min    Activity Tolerance Patient tolerated treatment well    Behavior During Therapy WFL for tasks assessed/performed             Past Medical History:  Diagnosis Date   Anemia    Arthritis    Atrial fibrillation with RVR (HCC)    converted to NSR with IV dilt 10/12 - no coumadin due to need for Plavix and ASA   Cataract    Clotting disorder (HCC)    PE 2012   Colon polyps 10/2015   (non-adenomatous)   Coronary artery disease    LHC 03/17/11: Dx 30%, OM1 30%, OM2 30%, pRCA 30%, EF 40-45% with anterolat and apical HK   Diverticulosis 10/2015   noted on colonoscopy   Elevated LFTs    Fatty liver    Hematochezia    LIKEY FROM HEMORRHOIDAL BLEEDING; PT HAS H/O   Hyperlipidemia    Hypertension    Internal hemorrhoids 10/2015   noted on colonoscopy   NSTEMI (non-ST elevated myocardial infarction) (HCC)    2012-FLET THAT NON-ST-ELEVATION MI IS POSSIBLY SECONDARY TO CORONARY EMBOLUS FROM HIS AFIB   Syncope    in setting of AFib with RVR   Past Surgical History:  Procedure Laterality Date   CARDIAC CATHETERIZATION  03/18/11   cardiac event monitor  04/01/11   CATARACT EXTRACTION W/PHACO Left 09/18/2014   Procedure: CATARACT EXTRACTION PHACO AND INTRAOCULAR LENS PLACEMENT LEFT EYE;  Surgeon: Gemma Payor, MD;  Location: [facility];  Service: Ophthalmology;  Laterality: Left;  CDE 13.32   COLONOSCOPY     ESOPHAGOGASTRODUODENOSCOPY ENDOSCOPY     MOUTH SURGERY     TOTAL HIP ARTHROPLASTY Left 07/10/2018   Procedure: TOTAL HIP ARTHROPLASTY ANTERIOR APPROACH;  Surgeon: Durene Romans, MD;   Location: [facility];  Service: Orthopedics;  Laterality: Left;  70 mins   TOTAL KNEE ARTHROPLASTY Left 01/10/2023   Procedure: TOTAL KNEE ARTHROPLASTY;  Surgeon: Durene Romans, MD;  Location: [facility];  Service: Orthopedics;  Laterality: Left;   TRANSTHORACIC ECHOCARDIOGRAM  03/19/11   WISDOM TOOTH EXTRACTION     Patient Active Problem List   Diagnosis Date Noted   S/P total knee arthroplasty, left 01/10/2023   Obese 07/11/2018   S/P left THA 07/10/2018   Iron deficiency anemia 11/08/2017   Gout of knee 10/11/2016   Impaired fasting glucose 07/24/2014   Mixed hyperlipidemia 07/10/2012   Elevated LFTs 04/01/2011   NSTEMI (non-ST elevated myocardial infarction) (HCC) 04/01/2011   Atrial fibrillation (HCC) 04/01/2011   Essential hypertension 04/01/2011   HLD (hyperlipidemia) 04/01/2011   Nonobstructive CAD by Cath 02/2011 04/01/2011    REFERRING PROVIDER: Durene Romans MD  REFERRING DIAG: S/p left total knee replacement.  THERAPY DIAG:  Chronic pain of left knee  Stiffness of left knee, not elsewhere classified  Localized edema  Muscle weakness (generalized)  Difficulty in walking, not elsewhere classified  Rationale for Evaluation and Treatment: Rehabilitation  ONSET DATE: 01/10/23.  SUBJECTIVE:   SUBJECTIVE STATEMENT:  Pain about a 2.  PERTINENT HISTORY: Left THA.  PAIN:  Are you having pain? Yes: NPRS scale: 2/10 Pain location: Left knee. Pain description: Sore Aggravating factors: Knee ROM Relieving factors: Rest.  PRECAUTIONS: Other: No ultrasound.  FALLS:  Has patient fallen in last 6 months? Yes. Number of falls 2.  PATIENT GOALS: Get around better without knee pain.  Play drums.  OBJECTIVE: all objective measures were assessed at his initial evaluation on 01/13/23 unless otherwise noted  PATIENT SURVEYS:  FOTO    OBSERVATION: Aquacel removed by MD   EDEMA:  Circumferential: Left knee: 51.1 cm, Right knee: 44.2 cm  PALPATION: C/o diffuse left  knee pain currently.  LOWER EXTREMITY ROM: In supine:  Left knee extension is -20 degrees and passive to -15 degrees (right knee is -10 degrees).  Flexion achieved to 75 degrees.  LOWER EXTREMITY ROM:     Active/Passive   Left 02/02/23  Flexion  Post-stretch 90A/95P       LOWER EXTREMITY MMT: Patient unable to perform a left antigravity SLR.  Left hip flexion and abduction graded grossly at 2-/5.  He is unable to perform a left SAQ.  TRANSFERS: Patient requires manual assist to transition from sit to supine and supine to sit.  GAIT:  Using rollator but L knee extension still greatly limited. None to minimal heel strike with gait.  TODAY'S TREATMENT:                                                                                                                              DATE:  02/02/23:                                     EXERCISE LOG  Exercise Repetitions and Resistance Comments  Nustep Level 3 x 15 minutes moving seat forward x 2 to increase flexion   Rockerboard in parallel bars 3 minutes   Mini-squats holding onto parallel bars 3 minutes.   Green band TKE's with parallel bar support 2 minutes.   Zero knee with 2# overpressure stretch 10 minutes.   Sustained right knee flexion stretch x 5 minutes f/b vasopneumatic x 15 minutes.                                   01/30/23 EXERCISE LOG  Exercise Repetitions and Resistance Comments  Nustep  L4 x 17 minutes; seat 12-11   Supine quad set  3.5 minutes w/ 5 second hold  Added to HEP   Supine gastroc stretch  4 x 30 seconds LLE only; added to HEP   Supine HS stretch  4 x 30 seconds LLE only; added to HEP   Heel prop for knee extension  7 minutes  LLE only   SLR 10 reps    Heel prop for knee extension  5 minutes  LLE only with vasopnuematic device   Blank cell = exercise not performed today  Modalities: no adverse reaction to today's modality   Date:  Vaso: Knee, 34 degrees; low pressure, 5 mins, Pain and Edema   01/26/23  EXERCISE LOG  Exercise Repetitions and Resistance Comments  Nustep L4 x18 min   Lunges 6" step x20 reps   Heel prop for ext X3 min   QS With VMS for NMR of L quad        Blank cell = exercise not performed today   Modalities  Date: 01/26/23 Unattended Estim: Knee, VMS 10/10, 300 usec, 5 sec ramp, 10 mins, Muscle Activation Vaso: Knee, Low, 10 mins, Pain and Edema  PATIENT EDUCATION:  Education details: walking, edema, healing, and getting into and out of the car Person educated: Patient and Spouse Education method: Explanation Education comprehension: verbalized understanding  HOME EXERCISE PROGRAM: As above.  ASSESSMENT:  CLINICAL IMPRESSION: Added multiple new interventions today which he did very well with.  Mini squats in parallel bars also emphasizing trying to extend knee.  He tolerated a 10 minutes Zero knee extension stretch with 2# of overpressure without complaint.  Post-stretching he achieved active left knee flexion to 90 degrees and passive to 95 degrees.  OBJECTIVE IMPAIRMENTS: Abnormal gait, decreased activity tolerance, decreased mobility, difficulty walking, decreased ROM, decreased strength, increased edema, and pain.   ACTIVITY LIMITATIONS: carrying, lifting, bending, standing, stairs, transfers, bed mobility, dressing, and locomotion level  PARTICIPATION LIMITATIONS: meal prep, cleaning, laundry, driving, community activity, and yard work  PERSONAL FACTORS: Time since onset of injury/illness/exacerbation are also affecting patient's functional outcome.   REHAB POTENTIAL: Excellent  CLINICAL DECISION MAKING: Stable/uncomplicated  EVALUATION COMPLEXITY: Low  GOALS:  SHORT TERM GOALS: Target date: 01/27/23  Ind with an initial HEP. Goal status: INITIAL  2.  Active left knee extension equal to right.  Goal status: INITIAL   LONG TERM GOALS: Target date: 02/24/23.  Ind with an advanced HEP.  Goal status: INITIAL  2.  Active left knee flexion to  115 degrees+ so the patient can perform functional tasks and do so with pain not > 2-3/10.  Goal status: INITIAL  3.  Increase left hip and knee strength to a solid 4+/5 to provide good stability for accomplishment of functional activities.  Goal status: INITIAL  4.  Perform a reciprocating stair gait with one railing with pain not > 2-3/10.  Goal status: INITIAL  5.  Return to playing the drums.  Goal status: INITIAL  6.  Walk in clinic 250 feet without assistive device with a normal gait pattern. Goal status: INITIAL  PLAN:  PT FREQUENCY:  2-3 times a week.  PT DURATION: 4 weeks  PLANNED INTERVENTIONS: Therapeutic exercises, Therapeutic activity, Neuromuscular re-education, Gait training, Patient/Family education, Self Care, Electrical stimulation, Cryotherapy, Vasopneumatic device, and Manual therapy  PLAN FOR NEXT SESSION: Nustep, PROM, VMS to left quadriceps. Progress per TKA protocol.  LE elevation and vasopneumatic.  Makaylynn Bonillas, [place], PT 02/02/2023, 10:21 AM

## 2023-02-06 ENCOUNTER — Ambulatory Visit: Payer: Medicare PPO | Admitting: *Deleted

## 2023-02-06 ENCOUNTER — Encounter: Payer: Self-pay | Admitting: *Deleted

## 2023-02-06 DIAGNOSIS — M25662 Stiffness of left knee, not elsewhere classified: Secondary | ICD-10-CM | POA: Diagnosis not present

## 2023-02-06 DIAGNOSIS — R600 Localized edema: Secondary | ICD-10-CM

## 2023-02-06 DIAGNOSIS — R262 Difficulty in walking, not elsewhere classified: Secondary | ICD-10-CM

## 2023-02-06 DIAGNOSIS — M25562 Pain in left knee: Secondary | ICD-10-CM | POA: Diagnosis not present

## 2023-02-06 DIAGNOSIS — M6281 Muscle weakness (generalized): Secondary | ICD-10-CM

## 2023-02-06 DIAGNOSIS — G8929 Other chronic pain: Secondary | ICD-10-CM | POA: Diagnosis not present

## 2023-02-06 NOTE — Therapy (Signed)
OUTPATIENT PHYSICAL THERAPY LOWER EXTREMITY TREATMENT   Patient Name: Timothy Estes MRN: [id number] DOB:1950/11/11, 72 y.o., male Today's Date: 02/06/2023  END OF SESSION:  PT End of Session - 02/06/23 0947     Visit Number 9    Number of Visits 12    Date for PT Re-Evaluation 02/24/23    Authorization Type FOTO.    PT Start Time 0930    PT Stop Time 1025    PT Time Calculation (min) 55 min             Past Medical History:  Diagnosis Date   Anemia    Arthritis    Atrial fibrillation with RVR (HCC)    converted to NSR with IV dilt 10/12 - no coumadin due to need for Plavix and ASA   Cataract    Clotting disorder (HCC)    PE 2012   Colon polyps 10/2015   (non-adenomatous)   Coronary artery disease    LHC 03/17/11: Dx 30%, OM1 30%, OM2 30%, pRCA 30%, EF 40-45% with anterolat and apical HK   Diverticulosis 10/2015   noted on colonoscopy   Elevated LFTs    Fatty liver    Hematochezia    LIKEY FROM HEMORRHOIDAL BLEEDING; PT HAS H/O   Hyperlipidemia    Hypertension    Internal hemorrhoids 10/2015   noted on colonoscopy   NSTEMI (non-ST elevated myocardial infarction) (HCC)    2012-FLET THAT NON-ST-ELEVATION MI IS POSSIBLY SECONDARY TO CORONARY EMBOLUS FROM HIS AFIB   Syncope    in setting of AFib with RVR   Past Surgical History:  Procedure Laterality Date   CARDIAC CATHETERIZATION  03/18/11   cardiac event monitor  04/01/11   CATARACT EXTRACTION W/PHACO Left 09/18/2014   Procedure: CATARACT EXTRACTION PHACO AND INTRAOCULAR LENS PLACEMENT LEFT EYE;  Surgeon: Gemma Payor, MD;  Location: [facility];  Service: Ophthalmology;  Laterality: Left;  CDE 13.32   COLONOSCOPY     ESOPHAGOGASTRODUODENOSCOPY ENDOSCOPY     MOUTH SURGERY     TOTAL HIP ARTHROPLASTY Left 07/10/2018   Procedure: TOTAL HIP ARTHROPLASTY ANTERIOR APPROACH;  Surgeon: Durene Romans, MD;  Location: [facility];  Service: Orthopedics;  Laterality: Left;  70 mins   TOTAL KNEE ARTHROPLASTY Left 01/10/2023   Procedure:  TOTAL KNEE ARTHROPLASTY;  Surgeon: Durene Romans, MD;  Location: [facility];  Service: Orthopedics;  Laterality: Left;   TRANSTHORACIC ECHOCARDIOGRAM  03/19/11   WISDOM TOOTH EXTRACTION     Patient Active Problem List   Diagnosis Date Noted   S/P total knee arthroplasty, left 01/10/2023   Obese 07/11/2018   S/P left THA 07/10/2018   Iron deficiency anemia 11/08/2017   Gout of knee 10/11/2016   Impaired fasting glucose 07/24/2014   Mixed hyperlipidemia 07/10/2012   Elevated LFTs 04/01/2011   NSTEMI (non-ST elevated myocardial infarction) (HCC) 04/01/2011   Atrial fibrillation (HCC) 04/01/2011   Essential hypertension 04/01/2011   HLD (hyperlipidemia) 04/01/2011   Nonobstructive CAD by Cath 02/2011 04/01/2011    REFERRING PROVIDER: Durene Romans MD  REFERRING DIAG: S/p left total knee replacement.  THERAPY DIAG:  Chronic pain of left knee  Stiffness of left knee, not elsewhere classified  Localized edema  Muscle weakness (generalized)  Difficulty in walking, not elsewhere classified  Rationale for Evaluation and Treatment: Rehabilitation  ONSET DATE: 01/10/23.  SUBJECTIVE:   SUBJECTIVE STATEMENT:  Pain about  2-3/10 LT knee  PERTINENT HISTORY: Left THA.  PAIN:  Are you having pain? Yes: NPRS scale: 2/10 Pain location:  Left knee. Pain description: Sore Aggravating factors: Knee ROM Relieving factors: Rest.  PRECAUTIONS: Other: No ultrasound.  FALLS:  Has patient fallen in last 6 months? Yes. Number of falls 2.  PATIENT GOALS: Get around better without knee pain.  Play drums.  OBJECTIVE: all objective measures were assessed at his initial evaluation on 01/13/23 unless otherwise noted  PATIENT SURVEYS:  FOTO    OBSERVATION: Aquacel removed by MD   EDEMA:  Circumferential: Left knee: 51.1 cm, Right knee: 44.2 cm  PALPATION: C/o diffuse left knee pain currently.  LOWER EXTREMITY ROM: In supine:  Left knee extension is -20 degrees and passive to -15  degrees (right knee is -10 degrees).  Flexion achieved to 75 degrees.  LOWER EXTREMITY ROM:     Active/Passive   Left 02/02/23  Flexion  Post-stretch 90A/95P       LOWER EXTREMITY MMT: Patient unable to perform a left antigravity SLR.  Left hip flexion and abduction graded grossly at 2-/5.  He is unable to perform a left SAQ.  TRANSFERS: Patient requires manual assist to transition from sit to supine and supine to sit.  GAIT:  Using rollator but L knee extension still greatly limited. None to minimal heel strike with gait.  TODAY'S TREATMENT:                                                                                                                              DATE:  02/06/23:                                     EXERCISE LOG    LT TKR  Exercise Repetitions and Resistance Comments  Nustep Level 3 x 16 minutes moving seat forward x 2 to increase flexion   Rockerboard in parallel bars 5  minutes   Mini-squats holding onto parallel bars 3x10   Green band TKE's with parallel bar support    LAQs 3# 3x10  5 sec holds at top Limited motion  Zero knee with 2# overpressure stretch     Manual PROM LT knee flexion stretch and extension x12 mins  vasopneumatic x 10 minutes.                                    01/30/23 EXERCISE LOG  Exercise Repetitions and Resistance Comments  Nustep  L4 x 17 minutes; seat 12-11   Supine quad set  3.5 minutes w/ 5 second hold  Added to HEP   Supine gastroc stretch  4 x 30 seconds LLE only; added to HEP   Supine HS stretch  4 x 30 seconds LLE only; added to HEP   Heel prop for knee extension  7 minutes  LLE only   SLR 10 reps    Heel prop for knee extension  5 minutes  LLE only with vasopnuematic device   Blank cell = exercise not performed today  Modalities: no adverse reaction to today's modality   Date:  Vaso: Knee, 34 degrees; low pressure, 5 mins, Pain and Edema   01/26/23 EXERCISE LOG  Exercise Repetitions and Resistance Comments  Nustep  L4 x18 min   Lunges 6" step x20 reps   Heel prop for ext X3 min   QS With VMS for NMR of L quad        Blank cell = exercise not performed today   Modalities  Date: 01/26/23 Unattended Estim: Knee, VMS 10/10, 300 usec, 5 sec ramp, 10 mins, Muscle Activation Vaso: Knee, Low, 10 mins, Pain and Edema  PATIENT EDUCATION:  Education details: walking, edema, healing, and getting into and out of the car Person educated: Patient and Spouse Education method: Explanation Education comprehension: verbalized understanding  HOME EXERCISE PROGRAM: As above.  ASSESSMENT:  CLINICAL IMPRESSION:  FOTO done Pt arrived today doing fairly well and reports doing a little better with ADL's , reports his RT knee also hurts and affects his function. ROM and strength progression were focused on today with extension being greatest ROM deficit  Mini squats in parallel bars also emphasizing trying to extend knee. Pt still with moderate swelling in LT knee  and with pitting edema lower leg. Vaso end of session.  OBJECTIVE IMPAIRMENTS: Abnormal gait, decreased activity tolerance, decreased mobility, difficulty walking, decreased ROM, decreased strength, increased edema, and pain.   ACTIVITY LIMITATIONS: carrying, lifting, bending, standing, stairs, transfers, bed mobility, dressing, and locomotion level  PARTICIPATION LIMITATIONS: meal prep, cleaning, laundry, driving, community activity, and yard work  PERSONAL FACTORS: Time since onset of injury/illness/exacerbation are also affecting patient's functional outcome.   REHAB POTENTIAL: Excellent  CLINICAL DECISION MAKING: Stable/uncomplicated  EVALUATION COMPLEXITY: Low  GOALS:  SHORT TERM GOALS: Target date: 01/27/23  Ind with an initial HEP. Goal status: INITIAL  2.  Active left knee extension equal to right.  Goal status: INITIAL   LONG TERM GOALS: Target date: 02/24/23.  Ind with an advanced HEP.  Goal status: INITIAL  2.  Active left knee  flexion to 115 degrees+ so the patient can perform functional tasks and do so with pain not > 2-3/10.  Goal status: INITIAL  3.  Increase left hip and knee strength to a solid 4+/5 to provide good stability for accomplishment of functional activities.  Goal status: INITIAL  4.  Perform a reciprocating stair gait with one railing with pain not > 2-3/10.  Goal status: INITIAL  5.  Return to playing the drums.  Goal status: INITIAL  6.  Walk in clinic 250 feet without assistive device with a normal gait pattern. Goal status: INITIAL  PLAN:  PT FREQUENCY:  2-3 times a week.  PT DURATION: 4 weeks  PLANNED INTERVENTIONS: Therapeutic exercises, Therapeutic activity, Neuromuscular re-education, Gait training, Patient/Family education, Self Care, Electrical stimulation, Cryotherapy, Vasopneumatic device, and Manual therapy  PLAN FOR NEXT SESSION: Nustep, PROM, VMS to left quadriceps. Progress per TKA protocol.  LE elevation and vasopneumatic.  Tafari Humiston,[place], PTA 02/06/2023, 12:19 PM

## 2023-02-09 ENCOUNTER — Ambulatory Visit: Payer: Medicare PPO | Admitting: *Deleted

## 2023-02-09 ENCOUNTER — Encounter: Payer: Self-pay | Admitting: *Deleted

## 2023-02-09 DIAGNOSIS — M25562 Pain in left knee: Secondary | ICD-10-CM | POA: Diagnosis not present

## 2023-02-09 DIAGNOSIS — M25662 Stiffness of left knee, not elsewhere classified: Secondary | ICD-10-CM

## 2023-02-09 DIAGNOSIS — G8929 Other chronic pain: Secondary | ICD-10-CM | POA: Diagnosis not present

## 2023-02-09 DIAGNOSIS — R600 Localized edema: Secondary | ICD-10-CM | POA: Diagnosis not present

## 2023-02-09 DIAGNOSIS — M6281 Muscle weakness (generalized): Secondary | ICD-10-CM | POA: Diagnosis not present

## 2023-02-09 DIAGNOSIS — R262 Difficulty in walking, not elsewhere classified: Secondary | ICD-10-CM | POA: Diagnosis not present

## 2023-02-09 NOTE — Therapy (Addendum)
OUTPATIENT PHYSICAL THERAPY LOWER EXTREMITY TREATMENT   Patient Name: Timothy Estes MRN: [id number] DOB:[birth year], 72 y.o., male Today's Date: 02/09/2023  END OF SESSION:  PT End of Session - 02/09/23 0838     Visit Number 10    Number of Visits 12    Date for PT Re-Evaluation 02/24/23    Authorization Type FOTO.    PT Start Time 0845    PT Stop Time 0944    PT Time Calculation (min) 59 min             Past Medical History:  Diagnosis Date   Anemia    Arthritis    Atrial fibrillation with RVR (HCC)    converted to NSR with IV dilt 10/12 - no coumadin due to need for Plavix and ASA   Cataract    Clotting disorder (HCC)    PE 2012   Colon polyps 10/2015   (non-adenomatous)   Coronary artery disease    LHC 03/17/11: Dx 30%, OM1 30%, OM2 30%, pRCA 30%, EF 40-45% with anterolat and apical HK   Diverticulosis 10/2015   noted on colonoscopy   Elevated LFTs    Fatty liver    Hematochezia    LIKEY FROM HEMORRHOIDAL BLEEDING; PT HAS H/O   Hyperlipidemia    Hypertension    Internal hemorrhoids 10/2015   noted on colonoscopy   NSTEMI (non-ST elevated myocardial infarction) (HCC)    2012-FLET THAT NON-ST-ELEVATION MI IS POSSIBLY SECONDARY TO CORONARY EMBOLUS FROM HIS AFIB   Syncope    in setting of AFib with RVR   Past Surgical History:  Procedure Laterality Date   CARDIAC CATHETERIZATION  03/18/11   cardiac event monitor  04/01/11   CATARACT EXTRACTION W/PHACO Left 09/18/2014   Procedure: CATARACT EXTRACTION PHACO AND INTRAOCULAR LENS PLACEMENT LEFT EYE;  Surgeon: Gemma Payor, MD;  Location: [facility];  Service: Ophthalmology;  Laterality: Left;  CDE 13.32   COLONOSCOPY     ESOPHAGOGASTRODUODENOSCOPY ENDOSCOPY     MOUTH SURGERY     TOTAL HIP ARTHROPLASTY Left 07/10/2018   Procedure: TOTAL HIP ARTHROPLASTY ANTERIOR APPROACH;  Surgeon: Durene Romans, MD;  Location: [facility];  Service: Orthopedics;  Laterality: Left;  70 mins   TOTAL KNEE ARTHROPLASTY Left 01/10/2023   Procedure:  TOTAL KNEE ARTHROPLASTY;  Surgeon: Durene Romans, MD;  Location: [facility];  Service: Orthopedics;  Laterality: Left;   TRANSTHORACIC ECHOCARDIOGRAM  03/19/11   WISDOM TOOTH EXTRACTION     Patient Active Problem List   Diagnosis Date Noted   S/P total knee arthroplasty, left 01/10/2023   Obese 07/11/2018   S/P left THA 07/10/2018   Iron deficiency anemia 11/08/2017   Gout of knee 10/11/2016   Impaired fasting glucose 07/24/2014   Mixed hyperlipidemia 07/10/2012   Elevated LFTs 04/01/2011   NSTEMI (non-ST elevated myocardial infarction) (HCC) 04/01/2011   Atrial fibrillation (HCC) 04/01/2011   Essential hypertension 04/01/2011   HLD (hyperlipidemia) 04/01/2011   Nonobstructive CAD by Cath 02/2011 04/01/2011    REFERRING PROVIDER: Durene Romans MD  REFERRING DIAG: S/p left total knee replacement.  THERAPY DIAG:  Chronic pain of left knee  Stiffness of left knee, not elsewhere classified  Localized edema  Muscle weakness (generalized)  Difficulty in walking, not elsewhere classified  Rationale for Evaluation and Treatment: Rehabilitation  ONSET DATE: 01/10/23.  SUBJECTIVE:   SUBJECTIVE STATEMENT:  Pain about  2-3/10 LT knee. Have been working on extension at home  PERTINENT HISTORY: Left THA.  PAIN:  Are you having  pain? Yes: NPRS scale: 2/10 Pain location: Left knee. Pain description: Sore Aggravating factors: Knee ROM Relieving factors: Rest.  PRECAUTIONS: Other: No ultrasound.  FALLS:  Has patient fallen in last 6 months? Yes. Number of falls 2.  PATIENT GOALS: Get around better without knee pain.  Play drums.  OBJECTIVE: all objective measures were assessed at his initial evaluation on 01/13/23 unless otherwise noted  PATIENT SURVEYS:  FOTO    OBSERVATION: Aquacel removed by MD   EDEMA:  Circumferential: Left knee: 51.1 cm, Right knee: 44.2 cm  PALPATION: C/o diffuse left knee pain currently.  LOWER EXTREMITY ROM: In supine:  Left knee extension  is -20 degrees and passive to -15 degrees (right knee is -10 degrees).  Flexion achieved to 75 degrees.  LOWER EXTREMITY ROM:     Active/Passive   Left 02/02/23  Flexion  Post-stretch 90A/95P       LOWER EXTREMITY MMT: Patient unable to perform a left antigravity SLR.  Left hip flexion and abduction graded grossly at 2-/5.  He is unable to perform a left SAQ.  TRANSFERS: Patient requires manual assist to transition from sit to supine and supine to sit.  GAIT:  Using rollator but L knee extension still greatly limited. None to minimal heel strike with gait.  TODAY'S TREATMENT:                                                                                                                              DATE:  02/09/23:                                     EXERCISE LOG    LT TKR  Exercise Repetitions and Resistance Comments  Nustep Level 3 x 16 minutes moving seat forward x 2 to increase flexion  12,11   Rockerboard in parallel bars    Mini-squats holding onto parallel bars 3x10   Green band TKE's with parallel bar support    LAQs 3# 3x10  5 sec holds at top Limited motion  Zero knee with 2# overpressure stretch     Manual PROM LT knee flexion stretch to 100 degrees and extension   to -12 using contract relax technique STW to calf and HS  vasopneumatic x 10 minutes.                                    01/30/23 EXERCISE LOG  Exercise Repetitions and Resistance Comments  Nustep  L4 x 17 minutes; seat 12-11   Supine quad set  3.5 minutes w/ 5 second hold  Added to HEP   Supine gastroc stretch  4 x 30 seconds LLE only; added to HEP   Supine HS stretch  4 x 30 seconds LLE only; added to HEP   Heel prop for knee  extension  7 minutes  LLE only   SLR 10 reps    Heel prop for knee extension  5 minutes  LLE only with vasopnuematic device   Blank cell = exercise not performed today  Modalities: no adverse reaction to today's modality   Date:  Vaso: Knee, 34 degrees; low pressure, 5 mins,  Pain and Edema   01/26/23 EXERCISE LOG  Exercise Repetitions and Resistance Comments  Nustep L4 x18 min   Lunges 6" step x20 reps   Heel prop for ext X3 min   QS With VMS for NMR of L quad        Blank cell = exercise not performed today   Modalities  Date: 01/26/23 Unattended Estim: Knee, VMS 10/10, 300 usec, 5 sec ramp, 10 mins, Muscle Activation Vaso: Knee, Low, 10 mins, Pain and Edema  PATIENT EDUCATION:  Education details: walking, edema, healing, and getting into and out of the car Person educated: Patient and Spouse Education method: Explanation Education comprehension: verbalized understanding  HOME EXERCISE PROGRAM: As above.  ASSESSMENT:  CLINICAL IMPRESSION:  10 th visit progress note Pt arrived today doing fairly well and reports doing a little better with ADL's at home. Swelling continues to be a problem RT knee and lower leg with pitting edema PROM today 12-100 degrees. LTG's on going at this time due to ROM and strength deficits     OBJECTIVE IMPAIRMENTS: Abnormal gait, decreased activity tolerance, decreased mobility, difficulty walking, decreased ROM, decreased strength, increased edema, and pain.   ACTIVITY LIMITATIONS: carrying, lifting, bending, standing, stairs, transfers, bed mobility, dressing, and locomotion level  PARTICIPATION LIMITATIONS: meal prep, cleaning, laundry, driving, community activity, and yard work  PERSONAL FACTORS: Time since onset of injury/illness/exacerbation are also affecting patient's functional outcome.   REHAB POTENTIAL: Excellent  CLINICAL DECISION MAKING: Stable/uncomplicated  EVALUATION COMPLEXITY: Low  GOALS:  SHORT TERM GOALS: Target date: 01/27/23  Ind with an initial HEP. Goal status: MET  2.  Active left knee extension equal to right.  Goal status: On going   LONG TERM GOALS: Target date: 02/24/23.  Ind with an advanced HEP.  Goal status: On going  2.  Active left knee flexion to 115 degrees+ so the  patient can perform functional tasks and do so with pain not > 2-3/10.  Goal status: On going  3.  Increase left hip and knee strength to a solid 4+/5 to provide good stability for accomplishment of functional activities.  Goal status: On going  4.  Perform a reciprocating stair gait with one railing with pain not > 2-3/10.  Goal status: On going  5.  Return to playing the drums.  Goal status: On going  6.  Walk in clinic 250 feet without assistive device with a normal gait pattern. Goal status: On going  PLAN:  PT FREQUENCY:  2-3 times a week.  PT DURATION: 4 weeks  PLANNED INTERVENTIONS: Therapeutic exercises, Therapeutic activity, Neuromuscular re-education, Gait training, Patient/Family education, Self Care, Electrical stimulation, Cryotherapy, Vasopneumatic device, and Manual therapy  PLAN FOR NEXT SESSION: Nustep, PROM, VMS to left quadriceps. Progress per TKA protocol.  LE elevation and vasopneumatic.  Natascha Edmonds,CHRIS, PTA 02/09/2023, 6:11 PM   Progress Note Reporting Period 01/13/23 to 02/09/23.  See note below for Objective Data and Assessment of Progress/Goals. Patient's left knee flexion has improved significantly since beginning PT.  He continues to lack extension at this time.  His gait has improved with better advancement of his left LE.

## 2023-02-13 ENCOUNTER — Ambulatory Visit: Payer: Medicare PPO

## 2023-02-13 DIAGNOSIS — G8929 Other chronic pain: Secondary | ICD-10-CM

## 2023-02-13 DIAGNOSIS — R600 Localized edema: Secondary | ICD-10-CM | POA: Diagnosis not present

## 2023-02-13 DIAGNOSIS — M6281 Muscle weakness (generalized): Secondary | ICD-10-CM | POA: Diagnosis not present

## 2023-02-13 DIAGNOSIS — M25662 Stiffness of left knee, not elsewhere classified: Secondary | ICD-10-CM

## 2023-02-13 DIAGNOSIS — R262 Difficulty in walking, not elsewhere classified: Secondary | ICD-10-CM | POA: Diagnosis not present

## 2023-02-13 DIAGNOSIS — M25562 Pain in left knee: Secondary | ICD-10-CM | POA: Diagnosis not present

## 2023-02-13 NOTE — Therapy (Signed)
OUTPATIENT PHYSICAL THERAPY LOWER EXTREMITY TREATMENT   Patient Name: Timothy Estes MRN: [id number] DOB:[birth year], 72 y.o., male Today's Date: 02/13/2023  END OF SESSION:  PT End of Session - 02/13/23 0855     Visit Number 11    Number of Visits 12    Date for PT Re-Evaluation 02/24/23    Authorization Type FOTO.    PT Start Time 0845    PT Stop Time 0958    PT Time Calculation (min) 73 min    Activity Tolerance Patient tolerated treatment well    Behavior During Therapy WFL for tasks assessed/performed              Past Medical History:  Diagnosis Date   Anemia    Arthritis    Atrial fibrillation with RVR (HCC)    converted to NSR with IV dilt 10/12 - no coumadin due to need for Plavix and ASA   Cataract    Clotting disorder (HCC)    PE 2012   Colon polyps 10/2015   (non-adenomatous)   Coronary artery disease    LHC 03/17/11: Dx 30%, OM1 30%, OM2 30%, pRCA 30%, EF 40-45% with anterolat and apical HK   Diverticulosis 10/2015   noted on colonoscopy   Elevated LFTs    Fatty liver    Hematochezia    LIKEY FROM HEMORRHOIDAL BLEEDING; PT HAS H/O   Hyperlipidemia    Hypertension    Internal hemorrhoids 10/2015   noted on colonoscopy   NSTEMI (non-ST elevated myocardial infarction) (HCC)    2012-FLET THAT NON-ST-ELEVATION MI IS POSSIBLY SECONDARY TO CORONARY EMBOLUS FROM HIS AFIB   Syncope    in setting of AFib with RVR   Past Surgical History:  Procedure Laterality Date   CARDIAC CATHETERIZATION  03/18/11   cardiac event monitor  04/01/11   CATARACT EXTRACTION W/PHACO Left 09/18/2014   Procedure: CATARACT EXTRACTION PHACO AND INTRAOCULAR LENS PLACEMENT LEFT EYE;  Surgeon: Gemma Payor, MD;  Location: [facility];  Service: Ophthalmology;  Laterality: Left;  CDE 13.32   COLONOSCOPY     ESOPHAGOGASTRODUODENOSCOPY ENDOSCOPY     MOUTH SURGERY     TOTAL HIP ARTHROPLASTY Left 07/10/2018   Procedure: TOTAL HIP ARTHROPLASTY ANTERIOR APPROACH;  Surgeon: Durene Romans, MD;   Location: [facility];  Service: Orthopedics;  Laterality: Left;  70 mins   TOTAL KNEE ARTHROPLASTY Left 01/10/2023   Procedure: TOTAL KNEE ARTHROPLASTY;  Surgeon: Durene Romans, MD;  Location: [facility];  Service: Orthopedics;  Laterality: Left;   TRANSTHORACIC ECHOCARDIOGRAM  03/19/11   WISDOM TOOTH EXTRACTION     Patient Active Problem List   Diagnosis Date Noted   S/P total knee arthroplasty, left 01/10/2023   Obese 07/11/2018   S/P left THA 07/10/2018   Iron deficiency anemia 11/08/2017   Gout of knee 10/11/2016   Impaired fasting glucose 07/24/2014   Mixed hyperlipidemia 07/10/2012   Elevated LFTs 04/01/2011   NSTEMI (non-ST elevated myocardial infarction) (HCC) 04/01/2011   Atrial fibrillation (HCC) 04/01/2011   Essential hypertension 04/01/2011   HLD (hyperlipidemia) 04/01/2011   Nonobstructive CAD by Cath 02/2011 04/01/2011    REFERRING PROVIDER: Durene Romans MD  REFERRING DIAG: S/p left total knee replacement.  THERAPY DIAG:  Chronic pain of left knee  Stiffness of left knee, not elsewhere classified  Localized edema  Muscle weakness (generalized)  Difficulty in walking, not elsewhere classified  Rationale for Evaluation and Treatment: Rehabilitation  ONSET DATE: 01/10/23.  SUBJECTIVE:   SUBJECTIVE STATEMENT: Patient reports that he is  sore today, but not bad. He did not have any problems over the weekend.   PERTINENT HISTORY: Left THA.  PAIN:  Are you having pain? Yes: NPRS scale: 2-3/10 Pain location: Left knee. Pain description: Sore Aggravating factors: Knee ROM Relieving factors: Rest.  PRECAUTIONS: Other: No ultrasound.  FALLS:  Has patient fallen in last 6 months? Yes. Number of falls 2.  PATIENT GOALS: Get around better without knee pain.  Play drums.  OBJECTIVE: all objective measures were assessed at his initial evaluation on 01/13/23 unless otherwise noted  PATIENT SURVEYS:  FOTO    OBSERVATION: Aquacel removed by MD   EDEMA:   Circumferential: Left knee: 51.1 cm, Right knee: 44.2 cm  PALPATION: C/o diffuse left knee pain currently.  LOWER EXTREMITY ROM: In supine:  Left knee extension is -20 degrees and passive to -15 degrees (right knee is -10 degrees).  Flexion achieved to 75 degrees.  LOWER EXTREMITY ROM:     Active/Passive   Left 02/02/23  Flexion  Post-stretch 90A/95P       LOWER EXTREMITY MMT: Patient unable to perform a left antigravity SLR.  Left hip flexion and abduction graded grossly at 2-/5.  He is unable to perform a left SAQ.  TRANSFERS: Patient requires manual assist to transition from sit to supine and supine to sit.  GAIT:  Using rollator but L knee extension still greatly limited. None to minimal heel strike with gait.  TODAY'S TREATMENT:                                                                                                                              DATE:                                   02/13/23 EXERCISE LOG  Exercise Repetitions and Resistance Comments  Nustep  L4 x 18 minutes; seat 12-11   Soleus stretch on slant board  3 minutes    Standing HS stretch  3 minutes   Mini squat  2.5 minutes  BUE support; cueing with proper foot positioning and slow eccentric control  LAQ 3 minutes w/ 5 second hold  LLE only; partial arc of motion  Rocker board  5 minutes  BUE support  Low load prolonged knee extension stretch  5# x 1 minute and 8 minutes with vaspnuematic device    Blank cell = exercise not performed today  Manual Therapy Soft Tissue Mobilization: left quadriceps and hamstrings, for improved soft tissue extensibility Joint Mobilizations: tibiofemoral and patellar, grade I-IV for improved knee mobility Passive ROM: left knee, focus on knee extension Manual Traction: LLE long axis distraction, for improved knee mobility   Modalities: no redness or adverse reaction to today's modalities  Date:  Vaso: Knee, 34 degrees; low pressure, 8 mins, Pain and Edema  02/09/23:  EXERCISE LOG    LT TKR  Exercise Repetitions and Resistance Comments  Nustep Level 3 x 16 minutes moving seat forward x 2 to increase flexion  12,11   Rockerboard in parallel bars    Mini-squats holding onto parallel bars 3x10   Green band TKE's with parallel bar support    LAQs 3# 3x10  5 sec holds at top Limited motion  Zero knee with 2# overpressure stretch     Manual PROM LT knee flexion stretch to 100 degrees and extension   to -12 using contract relax technique STW to calf and HS  vasopneumatic x 10 minutes.                                    01/30/23 EXERCISE LOG  Exercise Repetitions and Resistance Comments  Nustep  L4 x 17 minutes; seat 12-11   Supine quad set  3.5 minutes w/ 5 second hold  Added to HEP   Supine gastroc stretch  4 x 30 seconds LLE only; added to HEP   Supine HS stretch  4 x 30 seconds LLE only; added to HEP   Heel prop for knee extension  7 minutes  LLE only   SLR 10 reps    Heel prop for knee extension  5 minutes  LLE only with vasopnuematic device   Blank cell = exercise not performed today  Modalities: no adverse reaction to today's modality   Date:  Vaso: Knee, 34 degrees; low pressure, 5 mins, Pain and Edema  PATIENT EDUCATION:  Education details: knee mobility and progress with therapy  Person educated: Patient Education method: Explanation Education comprehension: verbalized understanding  HOME EXERCISE PROGRAM: As above.  ASSESSMENT:  CLINICAL IMPRESSION:   Today's treatment focused on improved left knee extension through the use of manual therapy and appropriately matched interventions. He required moderate multimodal cueing with mini squats for proper positioning and eccentric quadriceps control to promote increased demand on his left lower extremity. Manual therapy focused on patellar and tibiofemoral joint mobilizations, soft tissue mobilization to his quadriceps and hamstrings, and overpressure into  knee extension for improved knee extension. Left lower extremity long axis distraction was found to be the most effective at improving his knee extension while limiting his familiar pain. He reported that his knee felt good upon the conclusion of treatment. He continues to require skilled physical therapy to address his remaining impairments to maximize his functional mobility.   OBJECTIVE IMPAIRMENTS: Abnormal gait, decreased activity tolerance, decreased mobility, difficulty walking, decreased ROM, decreased strength, increased edema, and pain.   ACTIVITY LIMITATIONS: carrying, lifting, bending, standing, stairs, transfers, bed mobility, dressing, and locomotion level  PARTICIPATION LIMITATIONS: meal prep, cleaning, laundry, driving, community activity, and yard work  PERSONAL FACTORS: Time since onset of injury/illness/exacerbation are also affecting patient's functional outcome.   REHAB POTENTIAL: Excellent  CLINICAL DECISION MAKING: Stable/uncomplicated  EVALUATION COMPLEXITY: Low  GOALS:  SHORT TERM GOALS: Target date: 01/27/23  Ind with an initial HEP. Goal status: MET  2.  Active left knee extension equal to right.  Goal status: On going   LONG TERM GOALS: Target date: 02/24/23.  Ind with an advanced HEP.  Goal status: On going  2.  Active left knee flexion to 115 degrees+ so the patient can perform functional tasks and do so with pain not > 2-3/10.  Goal status: On going  3.  Increase left hip and  knee strength to a solid 4+/5 to provide good stability for accomplishment of functional activities.  Goal status: On going  4.  Perform a reciprocating stair gait with one railing with pain not > 2-3/10.  Goal status: On going  5.  Return to playing the drums.  Goal status: On going  6.  Walk in clinic 250 feet without assistive device with a normal gait pattern. Goal status: On going  PLAN:  PT FREQUENCY:  2-3 times a week.  PT DURATION: 4 weeks  PLANNED  INTERVENTIONS: Therapeutic exercises, Therapeutic activity, Neuromuscular re-education, Gait training, Patient/Family education, Self Care, Electrical stimulation, Cryotherapy, Vasopneumatic device, and Manual therapy  PLAN FOR NEXT SESSION: Nustep, PROM, VMS to left quadriceps. Progress per TKA protocol.  LE elevation and vasopneumatic.  Granville Lewis, PT 02/13/2023, 10:11 AM

## 2023-02-16 ENCOUNTER — Ambulatory Visit: Payer: Medicare PPO | Admitting: Physical Therapy

## 2023-02-16 DIAGNOSIS — M25662 Stiffness of left knee, not elsewhere classified: Secondary | ICD-10-CM | POA: Diagnosis not present

## 2023-02-16 DIAGNOSIS — R600 Localized edema: Secondary | ICD-10-CM

## 2023-02-16 DIAGNOSIS — M6281 Muscle weakness (generalized): Secondary | ICD-10-CM | POA: Diagnosis not present

## 2023-02-16 DIAGNOSIS — M25562 Pain in left knee: Secondary | ICD-10-CM | POA: Diagnosis not present

## 2023-02-16 DIAGNOSIS — R262 Difficulty in walking, not elsewhere classified: Secondary | ICD-10-CM

## 2023-02-16 DIAGNOSIS — G8929 Other chronic pain: Secondary | ICD-10-CM | POA: Diagnosis not present

## 2023-02-16 NOTE — Therapy (Signed)
OUTPATIENT PHYSICAL THERAPY LOWER EXTREMITY TREATMENT   Patient Name: Timothy Estes MRN: [id number] DOB:18-Apr-1951, 72 y.o., male Today's Date: 02/16/2023  END OF SESSION:  PT End of Session - 02/16/23 0923     Visit Number 12    Number of Visits 18    Date for PT Re-Evaluation 03/10/23    Authorization Type FOTO.    PT Start Time 0845    PT Stop Time 0941    PT Time Calculation (min) 56 min    Activity Tolerance Patient tolerated treatment well    Behavior During Therapy WFL for tasks assessed/performed              Past Medical History:  Diagnosis Date   Anemia    Arthritis    Atrial fibrillation with RVR (HCC)    converted to NSR with IV dilt 10/12 - no coumadin due to need for Plavix and ASA   Cataract    Clotting disorder (HCC)    PE 2012   Colon polyps 10/2015   (non-adenomatous)   Coronary artery disease    LHC 03/17/11: Dx 30%, OM1 30%, OM2 30%, pRCA 30%, EF 40-45% with anterolat and apical HK   Diverticulosis 10/2015   noted on colonoscopy   Elevated LFTs    Fatty liver    Hematochezia    LIKEY FROM HEMORRHOIDAL BLEEDING; PT HAS H/O   Hyperlipidemia    Hypertension    Internal hemorrhoids 10/2015   noted on colonoscopy   NSTEMI (non-ST elevated myocardial infarction) (HCC)    2012-FLET THAT NON-ST-ELEVATION MI IS POSSIBLY SECONDARY TO CORONARY EMBOLUS FROM HIS AFIB   Syncope    in setting of AFib with RVR   Past Surgical History:  Procedure Laterality Date   CARDIAC CATHETERIZATION  03/18/11   cardiac event monitor  04/01/11   CATARACT EXTRACTION W/PHACO Left 09/18/2014   Procedure: CATARACT EXTRACTION PHACO AND INTRAOCULAR LENS PLACEMENT LEFT EYE;  Surgeon: Gemma Payor, MD;  Location: [facility];  Service: Ophthalmology;  Laterality: Left;  CDE 13.32   COLONOSCOPY     ESOPHAGOGASTRODUODENOSCOPY ENDOSCOPY     MOUTH SURGERY     TOTAL HIP ARTHROPLASTY Left 07/10/2018   Procedure: TOTAL HIP ARTHROPLASTY ANTERIOR APPROACH;  Surgeon: Durene Romans, MD;   Location: [facility];  Service: Orthopedics;  Laterality: Left;  70 mins   TOTAL KNEE ARTHROPLASTY Left 01/10/2023   Procedure: TOTAL KNEE ARTHROPLASTY;  Surgeon: Durene Romans, MD;  Location: [facility];  Service: Orthopedics;  Laterality: Left;   TRANSTHORACIC ECHOCARDIOGRAM  03/19/11   WISDOM TOOTH EXTRACTION     Patient Active Problem List   Diagnosis Date Noted   S/P total knee arthroplasty, left 01/10/2023   Obese 07/11/2018   S/P left THA 07/10/2018   Iron deficiency anemia 11/08/2017   Gout of knee 10/11/2016   Impaired fasting glucose 07/24/2014   Mixed hyperlipidemia 07/10/2012   Elevated LFTs 04/01/2011   NSTEMI (non-ST elevated myocardial infarction) (HCC) 04/01/2011   Atrial fibrillation (HCC) 04/01/2011   Essential hypertension 04/01/2011   HLD (hyperlipidemia) 04/01/2011   Nonobstructive CAD by Cath 02/2011 04/01/2011    REFERRING PROVIDER: Durene Romans MD  REFERRING DIAG: S/p left total knee replacement.  THERAPY DIAG:  Chronic pain of left knee - Plan: PT plan of care cert/re-cert  Stiffness of left knee, not elsewhere classified - Plan: PT plan of care cert/re-cert  Localized edema - Plan: PT plan of care cert/re-cert  Muscle weakness (generalized) - Plan: PT plan of care cert/re-cert  Difficulty in walking, not elsewhere classified - Plan: PT plan of care cert/re-cert  Rationale for Evaluation and Treatment: Rehabilitation  ONSET DATE: 01/10/23.  SUBJECTIVE:   SUBJECTIVE STATEMENT: Very stiff and sore from last treatment.  PERTINENT HISTORY: Left THA.  PAIN:  Are you having pain? Yes: NPRS scale: 2-3/10 Pain location: Left knee. Pain description: Sore Aggravating factors: Knee ROM Relieving factors: Rest.  PRECAUTIONS: Other: No ultrasound.  FALLS:  Has patient fallen in last 6 months? Yes. Number of falls 2.  PATIENT GOALS: Get around better without knee pain.  Play drums.  OBJECTIVE: all objective measures were assessed at his initial  evaluation on 01/13/23 unless otherwise noted  PATIENT SURVEYS:  FOTO    OBSERVATION: Aquacel removed by MD   EDEMA:  Circumferential: Left knee: 51.1 cm, Right knee: 44.2 cm  PALPATION: C/o diffuse left knee pain currently.  LOWER EXTREMITY ROM: In supine:  Left knee extension is -20 degrees and passive to -15 degrees (right knee is -10 degrees).  Flexion achieved to 75 degrees.  LOWER EXTREMITY ROM:     Active/Passive   Left 02/02/23 02/16/23  Flexion  Post-stretch 90A/95P 95 degs  Extension   -25 degs   LOWER EXTREMITY MMT: Patient unable to perform a left antigravity SLR.  Left hip flexion and abduction graded grossly at 2-/5.  He is unable to perform a left SAQ.  TRANSFERS: Patient requires manual assist to transition from sit to supine and supine to sit.  GAIT:  Using rollator but L knee extension still greatly limited. None to minimal heel strike with gait.  TODAY'S TREATMENT:                                                                                                                              DATE:  02/16/23:                                   EXERCISE LOG  Exercise Repetitions and Resistance Comments  Nustep Level 4 moving forward x 2 to increase flexion.                   Seated:  Sustained, low load long duration stretching x 15 minutes to patient's left knee with majority of time spent on increasing extension f/b LE elevation and folded pillow under left heel to promote extension and vasopneumatic on low x 15 minutes.                                   02/13/23 EXERCISE LOG  Exercise Repetitions and Resistance Comments  Nustep  L4 x 18 minutes; seat 12-11   Soleus stretch on slant board  3 minutes    Standing HS stretch  3 minutes   Mini squat  2.5 minutes  BUE support; cueing with  proper foot positioning and slow eccentric control  LAQ 3 minutes w/ 5 second hold  LLE only; partial arc of motion  Rocker board  5 minutes  BUE support  Low load  prolonged knee extension stretch  5# x 1 minute and 8 minutes with vaspnuematic device    Blank cell = exercise not performed today  Manual Therapy Soft Tissue Mobilization: left quadriceps and hamstrings, for improved soft tissue extensibility Joint Mobilizations: tibiofemoral and patellar, grade I-IV for improved knee mobility Passive ROM: left knee, focus on knee extension Manual Traction: LLE long axis distraction, for improved knee mobility   Modalities: no redness or adverse reaction to today's modalities  Date:  Vaso: Knee, 34 degrees; low pressure, 8 mins, Pain and Edema                                    02/09/23:  EXERCISE LOG    LT TKR  Exercise Repetitions and Resistance Comments  Nustep Level 3 x 16 minutes moving seat forward x 2 to increase flexion  12,11   Rockerboard in parallel bars    Mini-squats holding onto parallel bars 3x10   Green band TKE's with parallel bar support    LAQs 3# 3x10  5 sec holds at top Limited motion  Zero knee with 2# overpressure stretch     Manual PROM LT knee flexion stretch to 100 degrees and extension   to -12 using contract relax technique STW to calf and HS  vasopneumatic x 10 minutes.                              PATIENT EDUCATION:  Education details: knee mobility and progress with therapy  Person educated: Patient Education method: Explanation Education comprehension: verbalized understanding  HOME EXERCISE PROGRAM: As above.  ASSESSMENT:  CLINICAL IMPRESSION:   Patient states he was very stiff and sore after last treatment.  His knee was very stiff today and he states he was having a hard time relaxing.  Encouraged him to work hard on his HEP.  He has a Zero Knee type foam piece to work on extension.  He states he has 2# weights to add for some overpressure.  After prolonged stretching his extension was limited to -25 degrees of extension with flexion to 95 degrees.  He is still having difficulty advancing his left LE while  walking with his Rollator.     OBJECTIVE IMPAIRMENTS: Abnormal gait, decreased activity tolerance, decreased mobility, difficulty walking, decreased ROM, decreased strength, increased edema, and pain.   ACTIVITY LIMITATIONS: carrying, lifting, bending, standing, stairs, transfers, bed mobility, dressing, and locomotion level  PARTICIPATION LIMITATIONS: meal prep, cleaning, laundry, driving, community activity, and yard work  PERSONAL FACTORS: Time since onset of injury/illness/exacerbation are also affecting patient's functional outcome.   REHAB POTENTIAL: Excellent  CLINICAL DECISION MAKING: Stable/uncomplicated  EVALUATION COMPLEXITY: Low  GOALS:  SHORT TERM GOALS: Target date: 01/27/23  Ind with an initial HEP. Goal status: MET  2.  Active left knee extension equal to right.  Goal status: On going   LONG TERM GOALS: Target date: 02/24/23.  Ind with an advanced HEP.  Goal status: On going  2.  Active left knee flexion to 115 degrees+ so the patient can perform functional tasks and do so with pain not > 2-3/10.  Goal status: On going  3.  Increase  left hip and knee strength to a solid 4+/5 to provide good stability for accomplishment of functional activities.  Goal status: On going  4.  Perform a reciprocating stair gait with one railing with pain not > 2-3/10.  Goal status: On going  5.  Return to playing the drums.  Goal status: On going  6.  Walk in clinic 250 feet without assistive device with a normal gait pattern. Goal status: On going  PLAN:  PT FREQUENCY:  2-3 times a week.  PT DURATION: 4 weeks  PLANNED INTERVENTIONS: Therapeutic exercises, Therapeutic activity, Neuromuscular re-education, Gait training, Patient/Family education, Self Care, Electrical stimulation, Cryotherapy, Vasopneumatic device, and Manual therapy  PLAN FOR NEXT SESSION: Nustep, PROM, VMS to left quadriceps. Progress per TKA protocol.  LE elevation and vasopneumatic.  Kortnie Stovall,  [place], PT 02/16/2023, 9:43 AM

## 2023-02-19 ENCOUNTER — Other Ambulatory Visit: Payer: Self-pay | Admitting: Family Medicine

## 2023-02-19 DIAGNOSIS — E782 Mixed hyperlipidemia: Secondary | ICD-10-CM

## 2023-02-21 ENCOUNTER — Ambulatory Visit: Payer: Medicare PPO | Attending: Orthopedic Surgery | Admitting: *Deleted

## 2023-02-21 DIAGNOSIS — R600 Localized edema: Secondary | ICD-10-CM | POA: Insufficient documentation

## 2023-02-21 DIAGNOSIS — R262 Difficulty in walking, not elsewhere classified: Secondary | ICD-10-CM | POA: Insufficient documentation

## 2023-02-21 DIAGNOSIS — M25562 Pain in left knee: Secondary | ICD-10-CM | POA: Diagnosis not present

## 2023-02-21 DIAGNOSIS — M25662 Stiffness of left knee, not elsewhere classified: Secondary | ICD-10-CM | POA: Insufficient documentation

## 2023-02-21 DIAGNOSIS — M6281 Muscle weakness (generalized): Secondary | ICD-10-CM | POA: Diagnosis not present

## 2023-02-21 DIAGNOSIS — G8929 Other chronic pain: Secondary | ICD-10-CM | POA: Insufficient documentation

## 2023-02-21 NOTE — Therapy (Signed)
OUTPATIENT PHYSICAL THERAPY LOWER EXTREMITY TREATMENT   Patient Name: Timothy Estes MRN: [id number] DOB:December 07, 1950, 72 y.o., male Today's Date: 02/21/2023  END OF SESSION:  PT End of Session - 02/21/23 0910     Visit Number 13    Number of Visits 18    Date for PT Re-Evaluation 03/10/23    Authorization Type FOTO.    PT Start Time 29              Past Medical History:  Diagnosis Date   Anemia    Arthritis    Atrial fibrillation with RVR (HCC)    converted to NSR with IV dilt 10/12 - no coumadin due to need for Plavix and ASA   Cataract    Clotting disorder (HCC)    PE 2012   Colon polyps 10/2015   (non-adenomatous)   Coronary artery disease    LHC 03/17/11: Dx 30%, OM1 30%, OM2 30%, pRCA 30%, EF 40-45% with anterolat and apical HK   Diverticulosis 10/2015   noted on colonoscopy   Elevated LFTs    Fatty liver    Hematochezia    LIKEY FROM HEMORRHOIDAL BLEEDING; PT HAS H/O   Hyperlipidemia    Hypertension    Internal hemorrhoids 10/2015   noted on colonoscopy   NSTEMI (non-ST elevated myocardial infarction) (HCC)    2012-FLET THAT NON-ST-ELEVATION MI IS POSSIBLY SECONDARY TO CORONARY EMBOLUS FROM HIS AFIB   Syncope    in setting of AFib with RVR   Past Surgical History:  Procedure Laterality Date   CARDIAC CATHETERIZATION  03/18/11   cardiac event monitor  04/01/11   CATARACT EXTRACTION W/PHACO Left 09/18/2014   Procedure: CATARACT EXTRACTION PHACO AND INTRAOCULAR LENS PLACEMENT LEFT EYE;  Surgeon: Gemma Payor, MD;  Location: [facility];  Service: Ophthalmology;  Laterality: Left;  CDE 13.32   COLONOSCOPY     ESOPHAGOGASTRODUODENOSCOPY ENDOSCOPY     MOUTH SURGERY     TOTAL HIP ARTHROPLASTY Left 07/10/2018   Procedure: TOTAL HIP ARTHROPLASTY ANTERIOR APPROACH;  Surgeon: Durene Romans, MD;  Location: [facility];  Service: Orthopedics;  Laterality: Left;  70 mins   TOTAL KNEE ARTHROPLASTY Left 01/10/2023   Procedure: TOTAL KNEE ARTHROPLASTY;  Surgeon: Durene Romans, MD;   Location: [facility];  Service: Orthopedics;  Laterality: Left;   TRANSTHORACIC ECHOCARDIOGRAM  03/19/11   WISDOM TOOTH EXTRACTION     Patient Active Problem List   Diagnosis Date Noted   S/P total knee arthroplasty, left 01/10/2023   Obese 07/11/2018   S/P left THA 07/10/2018   Iron deficiency anemia 11/08/2017   Gout of knee 10/11/2016   Impaired fasting glucose 07/24/2014   Mixed hyperlipidemia 07/10/2012   Elevated LFTs 04/01/2011   NSTEMI (non-ST elevated myocardial infarction) (HCC) 04/01/2011   Atrial fibrillation (HCC) 04/01/2011   Essential hypertension 04/01/2011   HLD (hyperlipidemia) 04/01/2011   Nonobstructive CAD by Cath 02/2011 04/01/2011    REFERRING PROVIDER: Durene Romans MD  REFERRING DIAG: S/p left total knee replacement.  THERAPY DIAG:  Chronic pain of left knee  Stiffness of left knee, not elsewhere classified  Localized edema  Muscle weakness (generalized)  Difficulty in walking, not elsewhere classified  Rationale for Evaluation and Treatment: Rehabilitation  ONSET DATE: 01/10/23.  SUBJECTIVE:   SUBJECTIVE STATEMENT: Very stiff and sore from last treatment due to gout flare-up LT side. Tried walking with cane at home and did well  PERTINENT HISTORY: Left THA.  PAIN:  Are you having pain? Yes: NPRS scale: 2-3/10 Pain location:  Left knee. Pain description: Sore Aggravating factors: Knee ROM Relieving factors: Rest.  PRECAUTIONS: Other: No ultrasound.  FALLS:  Has patient fallen in last 6 months? Yes. Number of falls 2.  PATIENT GOALS: Get around better without knee pain.  Play drums.  OBJECTIVE: all objective measures were assessed at his initial evaluation on 01/13/23 unless otherwise noted  PATIENT SURVEYS:  FOTO    OBSERVATION: Aquacel removed by MD   EDEMA:  Circumferential: Left knee: 51.1 cm, Right knee: 44.2 cm  PALPATION: C/o diffuse left knee pain currently.  LOWER EXTREMITY ROM: In supine:  Left knee extension is -20  degrees and passive to -15 degrees (right knee is -10 degrees).  Flexion achieved to 75 degrees.  LOWER EXTREMITY ROM:     Active/Passive   Left 02/02/23 02/16/23  Flexion  Post-stretch 90A/95P 95 degs  Extension   -25 degs   LOWER EXTREMITY MMT: Patient unable to perform a left antigravity SLR.  Left hip flexion and abduction graded grossly at 2-/5.  He is unable to perform a left SAQ.  TRANSFERS: Patient requires manual assist to transition from sit to supine and supine to sit.  GAIT:  Using rollator but L knee extension still greatly limited. None to minimal heel strike with gait.  TODAY'S TREATMENT:                                                                                                                              DATE:  02/21/23:                                   EXERCISE LOG  Exercise Repetitions and Resistance Comments  Nustep Level 4 moving forward x 2 to increase flexion. X 16 mins   Partial squat 3x10 holding on for assist   LAQ 4# 3x10           Seated:  Sustained, low load long duration stretching x 10 minutes to patient's left knee with majority of time spent on increasing extension   LE elevation and vasopneumatic on low x 15 minutes.                                   02/13/23 EXERCISE LOG  Exercise Repetitions and Resistance Comments  Nustep  L4 x 18 minutes; seat 12-11   Soleus stretch on slant board  3 minutes    Standing HS stretch  3 minutes   Mini squat  2.5 minutes  BUE support; cueing with proper foot positioning and slow eccentric control  LAQ 3 minutes w/ 5 second hold  LLE only; partial arc of motion  Rocker board  5 minutes  BUE support  Low load prolonged knee extension stretch  5# x 1 minute and 8 minutes with vaspnuematic device    Blank cell =  exercise not performed today  Manual Therapy Soft Tissue Mobilization: left quadriceps and hamstrings, for improved soft tissue extensibility Joint Mobilizations: tibiofemoral and patellar, grade  I-IV for improved knee mobility Passive ROM: left knee, focus on knee extension Manual Traction: LLE long axis distraction, for improved knee mobility   Modalities: no redness or adverse reaction to today's modalities  Date:  Vaso: Knee, 34 degrees; low pressure, 8 mins, Pain and Edema                                    02/09/23:  EXERCISE LOG    LT TKR  Exercise Repetitions and Resistance Comments  Nustep Level 4 x 16 minutes moving seat forward x 2 to increase flexion  12,11   Rockerboard in parallel bars    Mini-squats holding onto parallel bars 3x10   Green band TKE's with parallel bar support    LAQs 3# 3x10  5 sec holds at top Limited motion  Zero knee with 2# overpressure stretch     Manual PROM LT knee flexion stretch to 100 degrees and extension   to -12 using contract relax technique STW to calf and HS  vasopneumatic x 10 minutes.                              PATIENT EDUCATION:  Education details: knee mobility and progress with therapy  Person educated: Patient Education method: Explanation Education comprehension: verbalized understanding  HOME EXERCISE PROGRAM: As above.  ASSESSMENT:  CLINICAL IMPRESSION:  FOTO Performed Patient states he has been sore from gout flare-up. His knee was very stiff today and he states he was having a hard time relaxing.  Encouraged him to work hard on his HEP.  He has a Zero Knee type foam piece to work on extension.  After PROM stretching his extension was limited to -25 degrees still.and flexion to 95 degrees. MD note next visit  OBJECTIVE IMPAIRMENTS: Abnormal gait, decreased activity tolerance, decreased mobility, difficulty walking, decreased ROM, decreased strength, increased edema, and pain.   ACTIVITY LIMITATIONS: carrying, lifting, bending, standing, stairs, transfers, bed mobility, dressing, and locomotion level  PARTICIPATION LIMITATIONS: meal prep, cleaning, laundry, driving, community activity, and yard  work  PERSONAL FACTORS: Time since onset of injury/illness/exacerbation are also affecting patient's functional outcome.   REHAB POTENTIAL: Excellent  CLINICAL DECISION MAKING: Stable/uncomplicated  EVALUATION COMPLEXITY: Low  GOALS:  SHORT TERM GOALS: Target date: 01/27/23  Ind with an initial HEP. Goal status: MET  2.  Active left knee extension equal to right.  Goal status: On going   LONG TERM GOALS: Target date: 02/24/23.  Ind with an advanced HEP.  Goal status: On going  2.  Active left knee flexion to 115 degrees+ so the patient can perform functional tasks and do so with pain not > 2-3/10.  Goal status: On going  3.  Increase left hip and knee strength to a solid 4+/5 to provide good stability for accomplishment of functional activities.  Goal status: On going  4.  Perform a reciprocating stair gait with one railing with pain not > 2-3/10.  Goal status: On going  5.  Return to playing the drums.  Goal status: On going  6.  Walk in clinic 250 feet without assistive device with a normal gait pattern. Goal status: On going  PLAN:  PT FREQUENCY:  2-3 times  a week.  PT DURATION: 4 weeks  PLANNED INTERVENTIONS: Therapeutic exercises, Therapeutic activity, Neuromuscular re-education, Gait training, Patient/Family education, Self Care, Electrical stimulation, Cryotherapy, Vasopneumatic device, and Manual therapy  PLAN FOR NEXT SESSION: Nustep, PROM, VMS to left quadriceps. Progress per TKA protocol.  LE elevation and vasopneumatic.  Romeo Zielinski,[place], PTA 02/21/2023, 9:44 AM

## 2023-02-23 ENCOUNTER — Ambulatory Visit: Payer: Medicare PPO | Admitting: Physical Therapy

## 2023-02-23 DIAGNOSIS — G8929 Other chronic pain: Secondary | ICD-10-CM | POA: Diagnosis not present

## 2023-02-23 DIAGNOSIS — Z96652 Presence of left artificial knee joint: Secondary | ICD-10-CM | POA: Diagnosis not present

## 2023-02-23 DIAGNOSIS — M6281 Muscle weakness (generalized): Secondary | ICD-10-CM

## 2023-02-23 DIAGNOSIS — Z471 Aftercare following joint replacement surgery: Secondary | ICD-10-CM | POA: Diagnosis not present

## 2023-02-23 DIAGNOSIS — R600 Localized edema: Secondary | ICD-10-CM

## 2023-02-23 DIAGNOSIS — M25662 Stiffness of left knee, not elsewhere classified: Secondary | ICD-10-CM

## 2023-02-23 DIAGNOSIS — R262 Difficulty in walking, not elsewhere classified: Secondary | ICD-10-CM | POA: Diagnosis not present

## 2023-02-23 DIAGNOSIS — M25562 Pain in left knee: Secondary | ICD-10-CM | POA: Diagnosis not present

## 2023-02-23 NOTE — Therapy (Signed)
OUTPATIENT PHYSICAL THERAPY LOWER EXTREMITY TREATMENT   Patient Name: Timothy Estes MRN: [id number] DOB:November 03, 1950, 72 y.o., male Today's Date: 02/23/2023  END OF SESSION:  PT End of Session - 02/23/23 0852     Visit Number 14    Number of Visits 18    Date for PT Re-Evaluation 03/10/23    Authorization Type FOTO.    PT Start Time [phone]    PT Stop Time 0950    PT Time Calculation (min) 66 min    Activity Tolerance Patient tolerated treatment well    Behavior During Therapy [facility] for tasks assessed/performed              Past Medical History:  Diagnosis Date   Anemia    Arthritis    Atrial fibrillation with RVR (HCC)    converted to NSR with IV dilt 10/12 - no coumadin due to need for Plavix and ASA   Cataract    Clotting disorder (HCC)    PE 2012   Colon polyps 10/2015   (non-adenomatous)   Coronary artery disease    LHC 03/17/11: Dx 30%, OM1 30%, OM2 30%, pRCA 30%, EF 40-45% with anterolat and apical HK   Diverticulosis 10/2015   noted on colonoscopy   Elevated LFTs    Fatty liver    Hematochezia    LIKEY FROM HEMORRHOIDAL BLEEDING; PT HAS H/O   Hyperlipidemia    Hypertension    Internal hemorrhoids 10/2015   noted on colonoscopy   NSTEMI (non-ST elevated myocardial infarction) (HCC)    2012-FLET THAT NON-ST-ELEVATION MI IS POSSIBLY SECONDARY TO CORONARY EMBOLUS FROM HIS AFIB   Syncope    in setting of AFib with RVR   Past Surgical History:  Procedure Laterality Date   CARDIAC CATHETERIZATION  03/18/11   cardiac event monitor  04/01/11   CATARACT EXTRACTION W/PHACO Left 09/18/2014   Procedure: CATARACT EXTRACTION PHACO AND INTRAOCULAR LENS PLACEMENT LEFT EYE;  Surgeon: Gemma Payor, MD;  Location: [facility];  Service: Ophthalmology;  Laterality: Left;  CDE 13.32   COLONOSCOPY     ESOPHAGOGASTRODUODENOSCOPY ENDOSCOPY     MOUTH SURGERY     TOTAL HIP ARTHROPLASTY Left 07/10/2018   Procedure: TOTAL HIP ARTHROPLASTY ANTERIOR APPROACH;  Surgeon: Durene Romans, MD;   Location: [facility];  Service: Orthopedics;  Laterality: Left;  70 mins   TOTAL KNEE ARTHROPLASTY Left 01/10/2023   Procedure: TOTAL KNEE ARTHROPLASTY;  Surgeon: Durene Romans, MD;  Location: [facility];  Service: Orthopedics;  Laterality: Left;   TRANSTHORACIC ECHOCARDIOGRAM  03/19/11   WISDOM TOOTH EXTRACTION     Patient Active Problem List   Diagnosis Date Noted   S/P total knee arthroplasty, left 01/10/2023   Obese 07/11/2018   S/P left THA 07/10/2018   Iron deficiency anemia 11/08/2017   Gout of knee 10/11/2016   Impaired fasting glucose 07/24/2014   Mixed hyperlipidemia 07/10/2012   Elevated LFTs 04/01/2011   NSTEMI (non-ST elevated myocardial infarction) (HCC) 04/01/2011   Atrial fibrillation (HCC) 04/01/2011   Essential hypertension 04/01/2011   HLD (hyperlipidemia) 04/01/2011   Nonobstructive CAD by Cath 02/2011 04/01/2011    REFERRING PROVIDER: Durene Romans MD  REFERRING DIAG: S/p left total knee replacement.  THERAPY DIAG:  Chronic pain of left knee  Stiffness of left knee, not elsewhere classified  Localized edema  Muscle weakness (generalized)  Difficulty in walking, not elsewhere classified  Rationale for Evaluation and Treatment: Rehabilitation  ONSET DATE: 01/10/23.  SUBJECTIVE:   SUBJECTIVE STATEMENT: Very stiff and sore from  last treatment due to gout flare-up LT side. Tried walking with cane at home and did well  PERTINENT HISTORY: Left THA.  PAIN:  Are you having pain? Yes: NPRS scale: 2-3/10 Pain location: Left knee. Pain description: Sore Aggravating factors: Knee ROM Relieving factors: Rest.  PRECAUTIONS: Other: No ultrasound.  FALLS:  Has patient fallen in last 6 months? Yes. Number of falls 2.  PATIENT GOALS: Get around better without knee pain.  Play drums.  OBJECTIVE: all objective measures were assessed at his initial evaluation on 01/13/23 unless otherwise noted  PATIENT SURVEYS:  FOTO    OBSERVATION: Aquacel removed by  MD   EDEMA:  Circumferential: Left knee: 51.1 cm, Right knee: 44.2 cm  PALPATION: C/o diffuse left knee pain currently.  LOWER EXTREMITY ROM: In supine:  Left knee extension is -20 degrees and passive to -15 degrees (right knee is -10 degrees).  Flexion achieved to 75 degrees.  LOWER EXTREMITY ROM:     Active/Passive   Left 02/02/23 02/16/23 02/23/23  Flexion  Post-stretch 90A/95P 95 degs Passive(100 degrees)  Extension   -25 degs -23 degrees   LOWER EXTREMITY MMT: Patient unable to perform a left antigravity SLR.  Left hip flexion and abduction graded grossly at 2-/5.  He is unable to perform a left SAQ.  TRANSFERS: Patient requires manual assist to transition from sit to supine and supine to sit.  GAIT:  Using rollator but L knee extension still greatly limited. None to minimal heel strike with gait.  TODAY'S TREATMENT:                                                                                                                              DATE:  02/23/23:                                       EXERCISE LOG  Exercise Repetitions and Resistance Comments  Nustep  Level 3 x 15 minutes moving seat forward x 2 to increase    4 inch steps on left in parallel bars  2 minutes   Rockerboard In parallel bars x 3 minutes           In supine:  Sustained stretching/PROM to patient left knee into extension 7 minutes f/b flexion x 7 minutes f/b  LE elevation and vasopneumatic x 20 minutes with pillow under heel to help facilitate extension.   02/21/23:                                   EXERCISE LOG  Exercise Repetitions and Resistance Comments  Nustep Level 4 moving forward x 2 to increase flexion. X 16 mins   Partial squat 3x10 holding on for assist   LAQ 4# 3x10  Seated:  Sustained, low load long duration stretching x 10 minutes to patient's left knee with majority of time spent on increasing extension   LE elevation and vasopneumatic on low x 15 minutes.                                                PATIENT EDUCATION:  Education details: knee mobility and progress with therapy  Person educated: Patient Education method: Explanation Education comprehension: verbalized understanding  HOME EXERCISE PROGRAM: As above.  ASSESSMENT:  CLINICAL IMPRESSION:  The patient continues to struggle with left knee range of motion due to stiffness, especially into extension.  Passive extension limited to -23 degrees after prolonged stretching and flexion to 100 degrees.  Told him and wife to work hard on his HEP and use his Zero Knee to work on increasing extension.  He is using a Rollator and left LE advancement is challenging.    OBJECTIVE IMPAIRMENTS: Abnormal gait, decreased activity tolerance, decreased mobility, difficulty walking, decreased ROM, decreased strength, increased edema, and pain.   ACTIVITY LIMITATIONS: carrying, lifting, bending, standing, stairs, transfers, bed mobility, dressing, and locomotion level  PARTICIPATION LIMITATIONS: meal prep, cleaning, laundry, driving, community activity, and yard work  PERSONAL FACTORS: Time since onset of injury/illness/exacerbation are also affecting patient's functional outcome.   REHAB POTENTIAL: Excellent  CLINICAL DECISION MAKING: Stable/uncomplicated  EVALUATION COMPLEXITY: Low  GOALS:  SHORT TERM GOALS: Target date: 01/27/23  Ind with an initial HEP. Goal status: MET  2.  Active left knee extension equal to right.  Goal status: On going   LONG TERM GOALS: Target date: 02/24/23.  Ind with an advanced HEP.  Goal status: On going  2.  Active left knee flexion to 115 degrees+ so the patient can perform functional tasks and do so with pain not > 2-3/10.  Goal status: On going  3.  Increase left hip and knee strength to a solid 4+/5 to provide good stability for accomplishment of functional activities.  Goal status: On going  4.  Perform a reciprocating stair gait with one railing with  pain not > 2-3/10.  Goal status: On going  5.  Return to playing the drums.  Goal status: On going  6.  Walk in clinic 250 feet without assistive device with a normal gait pattern. Goal status: On going  PLAN:  PT FREQUENCY:  2-3 times a week.  PT DURATION: 4 weeks  PLANNED INTERVENTIONS: Therapeutic exercises, Therapeutic activity, Neuromuscular re-education, Gait training, Patient/Family education, Self Care, Electrical stimulation, Cryotherapy, Vasopneumatic device, and Manual therapy  PLAN FOR NEXT SESSION: Nustep, PROM, VMS to left quadriceps. Progress per TKA protocol.  LE elevation and vasopneumatic.  Avari Gelles, [place], PT 02/23/2023, 10:11 AM

## 2023-02-28 ENCOUNTER — Ambulatory Visit: Payer: Medicare PPO | Admitting: Physical Therapy

## 2023-02-28 DIAGNOSIS — R600 Localized edema: Secondary | ICD-10-CM

## 2023-02-28 DIAGNOSIS — G8929 Other chronic pain: Secondary | ICD-10-CM

## 2023-02-28 DIAGNOSIS — R262 Difficulty in walking, not elsewhere classified: Secondary | ICD-10-CM | POA: Diagnosis not present

## 2023-02-28 DIAGNOSIS — M6281 Muscle weakness (generalized): Secondary | ICD-10-CM

## 2023-02-28 DIAGNOSIS — M25562 Pain in left knee: Secondary | ICD-10-CM | POA: Diagnosis not present

## 2023-02-28 DIAGNOSIS — M25662 Stiffness of left knee, not elsewhere classified: Secondary | ICD-10-CM | POA: Diagnosis not present

## 2023-02-28 NOTE — Therapy (Signed)
OUTPATIENT PHYSICAL THERAPY LOWER EXTREMITY TREATMENT   Patient Name: Timothy Estes MRN: [id number] DOB:[birth year], 72 y.o., male Today's Date: 02/28/2023  END OF SESSION:  PT End of Session - 02/28/23 0847     Visit Number 15    Number of Visits 18    Date for PT Re-Evaluation 03/10/23    Authorization Type FOTO.    PT Start Time [id number]    PT Stop Time 0855    PT Time Calculation (min) 64 min    Activity Tolerance Patient tolerated treatment well    Behavior During Therapy [facility] for tasks assessed/performed              Past Medical History:  Diagnosis Date   Anemia    Arthritis    Atrial fibrillation with RVR (HCC)    converted to NSR with IV dilt 10/12 - no coumadin due to need for Plavix and ASA   Cataract    Clotting disorder (HCC)    PE 2012   Colon polyps 10/2015   (non-adenomatous)   Coronary artery disease    LHC 03/17/11: Dx 30%, OM1 30%, OM2 30%, pRCA 30%, EF 40-45% with anterolat and apical HK   Diverticulosis 10/2015   noted on colonoscopy   Elevated LFTs    Fatty liver    Hematochezia    LIKEY FROM HEMORRHOIDAL BLEEDING; PT HAS H/O   Hyperlipidemia    Hypertension    Internal hemorrhoids 10/2015   noted on colonoscopy   NSTEMI (non-ST elevated myocardial infarction) (HCC)    2012-FLET THAT NON-ST-ELEVATION MI IS POSSIBLY SECONDARY TO CORONARY EMBOLUS FROM HIS AFIB   Syncope    in setting of AFib with RVR   Past Surgical History:  Procedure Laterality Date   CARDIAC CATHETERIZATION  03/18/11   cardiac event monitor  04/01/11   CATARACT EXTRACTION W/PHACO Left 09/18/2014   Procedure: CATARACT EXTRACTION PHACO AND INTRAOCULAR LENS PLACEMENT LEFT EYE;  Surgeon: Gemma Payor, MD;  Location: [facility];  Service: Ophthalmology;  Laterality: Left;  CDE 13.32   COLONOSCOPY     ESOPHAGOGASTRODUODENOSCOPY ENDOSCOPY     MOUTH SURGERY     TOTAL HIP ARTHROPLASTY Left 07/10/2018   Procedure: TOTAL HIP ARTHROPLASTY ANTERIOR APPROACH;  Surgeon: Durene Romans, MD;   Location: [facility];  Service: Orthopedics;  Laterality: Left;  70 mins   TOTAL KNEE ARTHROPLASTY Left 01/10/2023   Procedure: TOTAL KNEE ARTHROPLASTY;  Surgeon: Durene Romans, MD;  Location: [facility];  Service: Orthopedics;  Laterality: Left;   TRANSTHORACIC ECHOCARDIOGRAM  03/19/11   WISDOM TOOTH EXTRACTION     Patient Active Problem List   Diagnosis Date Noted   S/P total knee arthroplasty, left 01/10/2023   Obese 07/11/2018   S/P left THA 07/10/2018   Iron deficiency anemia 11/08/2017   Gout of knee 10/11/2016   Impaired fasting glucose 07/24/2014   Mixed hyperlipidemia 07/10/2012   Elevated LFTs 04/01/2011   NSTEMI (non-ST elevated myocardial infarction) (HCC) 04/01/2011   Atrial fibrillation (HCC) 04/01/2011   Essential hypertension 04/01/2011   HLD (hyperlipidemia) 04/01/2011   Nonobstructive CAD by Cath 02/2011 04/01/2011    REFERRING PROVIDER: Durene Romans MD  REFERRING DIAG: S/p left total knee replacement.  THERAPY DIAG:  Chronic pain of left knee  Stiffness of left knee, not elsewhere classified  Localized edema  Muscle weakness (generalized)  Difficulty in walking, not elsewhere classified  Rationale for Evaluation and Treatment: Rehabilitation  ONSET DATE: 01/10/23.  SUBJECTIVE:   SUBJECTIVE STATEMENT: Patient to see Dr. Charlann Boxer.  Said he really needed to improve range of motion to avoid a manipulation.  His wife, Darl Pikes, has been working hard with him and making sure the home exercises are being done.  He has also been placed on a regimen of Prednisone.    PERTINENT HISTORY: Left THA.  PAIN:  Are you having pain? Yes: NPRS scale: 2/10 Pain location: Left knee. Pain description: Sore Aggravating factors: Knee ROM Relieving factors: Rest.  PRECAUTIONS: Other: No ultrasound.  FALLS:  Has patient fallen in last 6 months? Yes. Number of falls 2.  PATIENT GOALS: Get around better without knee pain.  Play drums.  OBJECTIVE: all objective measures were  assessed at his initial evaluation on 01/13/23 unless otherwise noted  PATIENT SURVEYS:  FOTO    OBSERVATION: Aquacel removed by MD   EDEMA:  Circumferential: Left knee: 51.1 cm, Right knee: 44.2 cm  PALPATION: C/o diffuse left knee pain currently.  LOWER EXTREMITY ROM: In supine:  Left knee extension is -20 degrees and passive to -15 degrees (right knee is -10 degrees).  Flexion achieved to 75 degrees.  LOWER EXTREMITY ROM:     Active/Passive   Left 02/02/23 02/16/23 02/23/23  Flexion  Post-stretch 90A/95P 95 degs Passive(100 degrees)  Extension   -25 degs -23 degrees   LOWER EXTREMITY MMT: Patient unable to perform a left antigravity SLR.  Left hip flexion and abduction graded grossly at 2-/5.  He is unable to perform a left SAQ.  TRANSFERS: Patient requires manual assist to transition from sit to supine and supine to sit.  GAIT:  Using rollator but L knee extension still greatly limited. None to minimal heel strike with gait.  TODAY'S TREATMENT:                                                                                                                              DATE:   02/28/23:                                     EXERCISE LOG  Exercise Repetitions and Resistance Comments  Nustep Level 2 moving seat forward x 3 to increase flexion..                   In supine and seated:  Prolonged stretching into flexion and extension (overpressure, SLR, and seated flexion) x 20 minutes and left patellar mobs x 3 minutes f/b LE elevation and vasopneumatic x 15 minutes (this also facilitating an extension stretch).    02/23/23:                                       EXERCISE LOG  Exercise Repetitions and Resistance Comments  Nustep  Level 3 x 15 minutes moving seat forward x 2 to increase  4 inch steps on left in parallel bars  2 minutes   Rockerboard In parallel bars x 3 minutes           In supine:  Sustained stretching/PROM to patient left knee into extension 7  minutes f/b flexion x 7 minutes f/b  LE elevation and vasopneumatic x 20 minutes with pillow under heel to help facilitate extension.   02/21/23:                                   EXERCISE LOG  Exercise Repetitions and Resistance Comments  Nustep Level 4 moving forward x 2 to increase flexion. X 16 mins   Partial squat 3x10 holding on for assist   LAQ 4# 3x10           Seated:  Sustained, low load long duration stretching x 10 minutes to patient's left knee with majority of time spent on increasing extension   LE elevation and vasopneumatic on low x 15 minutes.                                               PATIENT EDUCATION:  Education details: See below. Person educated: Patient, spouse Education method: Explanation, demo Education comprehension: verbalized understanding  HOME EXERCISE PROGRAM: HOME EXERCISE PROGRAM Created by [place] Verlene Glantz Oct 8th, 2024 View at [email or web address] using code: [email or web address]  Page 1 of 1 2 Exercises PRONE QUAD STRETCH WITH BELT OR STRAP Start by lying on your stomach with a strap or 2 belts linked together and looped it around your affected side ankle. Next, use the belt to pull the knee into a bent position allowing for a stretch as shown. Repeat 10 Times Hold 30 Seconds Complete 1 Set Perform 4 Times a Day PRONE KNEE HANGS While lying down on your stomach, allow your leg to hang off the end of a table/bed. Position yourself so that your knee cap is just over the end of the table/bed.  Just relax your body and allow gravity to stretch your knee into a more straightened position. Add an ankle weight for increased stretch. Repeat 1 Time Hold 10 Minutes Complete 1 Set Perform 3 Times a Day ASSESSMENT:  CLINICAL IMPRESSION:  The patient presented to the clinic today with improved toe clearance.  His wife, Darl Pikes, has been has been making sure his is compliant to his HEP and has been working directly with him.  Added prone hang for  extension and prone sheet stretch to increase flexion to his HEP.  He did much better today and was able to achieve left knee extension passively to -20 degrees.  OBJECTIVE IMPAIRMENTS: Abnormal gait, decreased activity tolerance, decreased mobility, difficulty walking, decreased ROM, decreased strength, increased edema, and pain.   ACTIVITY LIMITATIONS: carrying, lifting, bending, standing, stairs, transfers, bed mobility, dressing, and locomotion level  PARTICIPATION LIMITATIONS: meal prep, cleaning, laundry, driving, community activity, and yard work  PERSONAL FACTORS: Time since onset of injury/illness/exacerbation are also affecting patient's functional outcome.   REHAB POTENTIAL: Excellent  CLINICAL DECISION MAKING: Stable/uncomplicated  EVALUATION COMPLEXITY: Low  GOALS:  SHORT TERM GOALS: Target date: 01/27/23  Ind with an initial HEP. Goal status: MET  2.  Active left knee extension equal to right.  Goal status: On going   LONG TERM GOALS: Target date:  02/24/23.  Ind with an advanced HEP.  Goal status: On going  2.  Active left knee flexion to 115 degrees+ so the patient can perform functional tasks and do so with pain not > 2-3/10.  Goal status: On going  3.  Increase left hip and knee strength to a solid 4+/5 to provide good stability for accomplishment of functional activities.  Goal status: On going  4.  Perform a reciprocating stair gait with one railing with pain not > 2-3/10.  Goal status: On going  5.  Return to playing the drums.  Goal status: On going  6.  Walk in clinic 250 feet without assistive device with a normal gait pattern. Goal status: On going  PLAN:  PT FREQUENCY:  2-3 times a week.  PT DURATION: 4 weeks  PLANNED INTERVENTIONS: Therapeutic exercises, Therapeutic activity, Neuromuscular re-education, Gait training, Patient/Family education, Self Care, Electrical stimulation, Cryotherapy, Vasopneumatic device, and Manual therapy  PLAN  FOR NEXT SESSION: Nustep, PROM, VMS to left quadriceps. Progress per TKA protocol.  LE elevation and vasopneumatic.  Carvell Hoeffner, [place], PT 02/28/2023, 9:45 AM

## 2023-03-02 ENCOUNTER — Ambulatory Visit: Payer: Medicare PPO

## 2023-03-02 DIAGNOSIS — R600 Localized edema: Secondary | ICD-10-CM

## 2023-03-02 DIAGNOSIS — R262 Difficulty in walking, not elsewhere classified: Secondary | ICD-10-CM

## 2023-03-02 DIAGNOSIS — M25662 Stiffness of left knee, not elsewhere classified: Secondary | ICD-10-CM

## 2023-03-02 DIAGNOSIS — G8929 Other chronic pain: Secondary | ICD-10-CM

## 2023-03-02 DIAGNOSIS — M6281 Muscle weakness (generalized): Secondary | ICD-10-CM

## 2023-03-02 DIAGNOSIS — M25562 Pain in left knee: Secondary | ICD-10-CM | POA: Diagnosis not present

## 2023-03-02 NOTE — Addendum Note (Signed)
Addended by: Skyanne Welle, [place] W on: 03/02/2023 01:20 PM   Modules accepted: Orders

## 2023-03-02 NOTE — Therapy (Signed)
OUTPATIENT PHYSICAL THERAPY LOWER EXTREMITY TREATMENT   Patient Name: Timothy Estes MRN: [id number] DOB:[birth year], 72 y.o., male Today's Date: 03/02/2023  END OF SESSION:  PT End of Session - 03/02/23 0847     Visit Number 16    Number of Visits 18    Date for PT Re-Evaluation 03/10/23    Authorization Type FOTO.    PT Start Time 0845    PT Stop Time 0940    PT Time Calculation (min) 55 min    Activity Tolerance Patient tolerated treatment well    Behavior During Therapy WFL for tasks assessed/performed               Past Medical History:  Diagnosis Date   Anemia    Arthritis    Atrial fibrillation with RVR (HCC)    converted to NSR with IV dilt 10/12 - no coumadin due to need for Plavix and ASA   Cataract    Clotting disorder (HCC)    PE 2012   Colon polyps 10/2015   (non-adenomatous)   Coronary artery disease    LHC 03/17/11: Dx 30%, OM1 30%, OM2 30%, pRCA 30%, EF 40-45% with anterolat and apical HK   Diverticulosis 10/2015   noted on colonoscopy   Elevated LFTs    Fatty liver    Hematochezia    LIKEY FROM HEMORRHOIDAL BLEEDING; PT HAS H/O   Hyperlipidemia    Hypertension    Internal hemorrhoids 10/2015   noted on colonoscopy   NSTEMI (non-ST elevated myocardial infarction) (HCC)    2012-FLET THAT NON-ST-ELEVATION MI IS POSSIBLY SECONDARY TO CORONARY EMBOLUS FROM HIS AFIB   Syncope    in setting of AFib with RVR   Past Surgical History:  Procedure Laterality Date   CARDIAC CATHETERIZATION  03/18/11   cardiac event monitor  04/01/11   CATARACT EXTRACTION W/PHACO Left 09/18/2014   Procedure: CATARACT EXTRACTION PHACO AND INTRAOCULAR LENS PLACEMENT LEFT EYE;  Surgeon: Gemma Payor, MD;  Location: [facility];  Service: Ophthalmology;  Laterality: Left;  CDE 13.32   COLONOSCOPY     ESOPHAGOGASTRODUODENOSCOPY ENDOSCOPY     MOUTH SURGERY     TOTAL HIP ARTHROPLASTY Left 07/10/2018   Procedure: TOTAL HIP ARTHROPLASTY ANTERIOR APPROACH;  Surgeon: Durene Romans, MD;   Location: [facility];  Service: Orthopedics;  Laterality: Left;  70 mins   TOTAL KNEE ARTHROPLASTY Left 01/10/2023   Procedure: TOTAL KNEE ARTHROPLASTY;  Surgeon: Durene Romans, MD;  Location: [facility];  Service: Orthopedics;  Laterality: Left;   TRANSTHORACIC ECHOCARDIOGRAM  03/19/11   WISDOM TOOTH EXTRACTION     Patient Active Problem List   Diagnosis Date Noted   S/P total knee arthroplasty, left 01/10/2023   Obese 07/11/2018   S/P left THA 07/10/2018   Iron deficiency anemia 11/08/2017   Gout of knee 10/11/2016   Impaired fasting glucose 07/24/2014   Mixed hyperlipidemia 07/10/2012   Elevated LFTs 04/01/2011   NSTEMI (non-ST elevated myocardial infarction) (HCC) 04/01/2011   Atrial fibrillation (HCC) 04/01/2011   Essential hypertension 04/01/2011   HLD (hyperlipidemia) 04/01/2011   Nonobstructive CAD by Cath 02/2011 04/01/2011    REFERRING PROVIDER: Durene Romans MD  REFERRING DIAG: S/p left total knee replacement.  THERAPY DIAG:  Chronic pain of left knee  Stiffness of left knee, not elsewhere classified  Localized edema  Muscle weakness (generalized)  Difficulty in walking, not elsewhere classified  Rationale for Evaluation and Treatment: Rehabilitation  ONSET DATE: 01/10/23.  SUBJECTIVE:   SUBJECTIVE STATEMENT: Patient reports that his  knee is sore today from his HEP.   PERTINENT HISTORY: Left THA.  PAIN:  Are you having pain? Yes: NPRS scale: 2-3/10 Pain location: Left knee. Pain description: Sore Aggravating factors: Knee ROM Relieving factors: Rest.  PRECAUTIONS: Other: No ultrasound.  FALLS:  Has patient fallen in last 6 months? Yes. Number of falls 2.  PATIENT GOALS: Get around better without knee pain.  Play drums.  OBJECTIVE: all objective measures were assessed at his initial evaluation on 01/13/23 unless otherwise noted  PATIENT SURVEYS:  FOTO    OBSERVATION: Aquacel removed by MD   EDEMA:  Circumferential: Left knee: 51.1 cm, Right knee:  44.2 cm  PALPATION: C/o diffuse left knee pain currently.  LOWER EXTREMITY ROM: In supine:  Left knee extension is -20 degrees and passive to -15 degrees (right knee is -10 degrees).  Flexion achieved to 75 degrees.  LOWER EXTREMITY ROM:     Active/Passive   Left 02/02/23 02/16/23 02/23/23  Flexion  Post-stretch 90A/95P 95 degs Passive(100 degrees)  Extension   -25 degs -23 degrees   LOWER EXTREMITY MMT: Patient unable to perform a left antigravity SLR.  Left hip flexion and abduction graded grossly at 2-/5.  He is unable to perform a left SAQ.  TRANSFERS: Patient requires manual assist to transition from sit to supine and supine to sit.  GAIT:  Using rollator but L knee extension still greatly limited. None to minimal heel strike with gait.  TODAY'S TREATMENT:                                                                                                                              DATE:                                   03/02/23 EXERCISE LOG  Exercise Repetitions and Resistance Comments  Nustep  L4 x 18 minutes; seat 12-9   Low load prolonged knee extension stretch  5# x 12 minutes  With vaso   Blank cell = exercise not performed today  Manual Therapy Soft Tissue Mobilization: left quadriceps, hip adductors, and hamstrings, for improved soft tissue extensibility Joint Mobilizations: left patellar and tibiofemoral, grade I-IV for improved knee flexion and extension Passive ROM: flexion and extension, with overpressure to tolerance   Modalities: no redness or adverse reaction to today's modalities  Date:  Vaso: Knee, 34 degrees; low pressure, 15 mins, Pain                                    02/28/23: EXERCISE LOG  Exercise Repetitions and Resistance Comments  Nustep Level 2 moving seat forward x 3 to increase flexion..                   In supine and seated:  Prolonged stretching into  flexion and extension (overpressure, SLR, and seated flexion) x 20 minutes and left  patellar mobs x 3 minutes f/b LE elevation and vasopneumatic x 15 minutes (this also facilitating an extension stretch).                                      02/23/23: EXERCISE LOG  Exercise Repetitions and Resistance Comments  Nustep  Level 3 x 15 minutes moving seat forward x 2 to increase    4 inch steps on left in parallel bars  2 minutes   Rockerboard In parallel bars x 3 minutes           In supine:  Sustained stretching/PROM to patient left knee into extension 7 minutes f/b flexion x 7 minutes f/b  LE elevation and vasopneumatic x 20 minutes with pillow under heel to help facilitate extension.  PATIENT EDUCATION:  Education details: See below. Person educated: Patient, spouse Education method: Explanation, demo Education comprehension: verbalized understanding  HOME EXERCISE PROGRAM: HOME EXERCISE PROGRAM Created by [place] Applegate Oct 8th, 2024 View at [email or web address] using code: [email or web address]  Page 1 of 1 2 Exercises PRONE QUAD STRETCH WITH BELT OR STRAP Start by lying on your stomach with a strap or 2 belts linked together and looped it around your affected side ankle. Next, use the belt to pull the knee into a bent position allowing for a stretch as shown. Repeat 10 Times Hold 30 Seconds Complete 1 Set Perform 4 Times a Day PRONE KNEE HANGS While lying down on your stomach, allow your leg to hang off the end of a table/bed. Position yourself so that your knee cap is just over the end of the table/bed.  Just relax your body and allow gravity to stretch your knee into a more straightened position. Add an ankle weight for increased stretch. Repeat 1 Time Hold 10 Minutes Complete 1 Set Perform 3 Times a Day ASSESSMENT:  CLINICAL IMPRESSION:   Today's treatment focused on improved left knee mobility through the use of manual therapy and appropriately matched interventions. He was able to demonstrate improved passive knee flexion and extension as he was able to  achieve 100 degrees and 2 degrees, respectively. Pain was his primary limiting factor preventing further knee mobility. He was able to demonstrate 15 degrees of active knee extension and 99 degrees of active knee flexion upon the conclusion of treatment. He reported that his knee felt "good" upon the conclusion of treatment. He continues to require skilled physical therapy to address his remaining impairments to maximize his functional mobility.  OBJECTIVE IMPAIRMENTS: Abnormal gait, decreased activity tolerance, decreased mobility, difficulty walking, decreased ROM, decreased strength, increased edema, and pain.   ACTIVITY LIMITATIONS: carrying, lifting, bending, standing, stairs, transfers, bed mobility, dressing, and locomotion level  PARTICIPATION LIMITATIONS: meal prep, cleaning, laundry, driving, community activity, and yard work  PERSONAL FACTORS: Time since onset of injury/illness/exacerbation are also affecting patient's functional outcome.   REHAB POTENTIAL: Excellent  CLINICAL DECISION MAKING: Stable/uncomplicated  EVALUATION COMPLEXITY: Low  GOALS:  SHORT TERM GOALS: Target date: 01/27/23  Ind with an initial HEP. Goal status: MET  2.  Active left knee extension equal to right.  Goal status: On going   LONG TERM GOALS: Target date: 02/24/23.  Ind with an advanced HEP.  Goal status: On going  2.  Active left knee flexion to 115 degrees+ so the patient can perform functional tasks and  do so with pain not > 2-3/10.  Goal status: On going  3.  Increase left hip and knee strength to a solid 4+/5 to provide good stability for accomplishment of functional activities.  Goal status: On going  4.  Perform a reciprocating stair gait with one railing with pain not > 2-3/10.  Goal status: On going  5.  Return to playing the drums.  Goal status: On going  6.  Walk in clinic 250 feet without assistive device with a normal gait pattern. Goal status: On going  PLAN:  PT  FREQUENCY:  2-3 times a week.  PT DURATION: 4 weeks  PLANNED INTERVENTIONS: Therapeutic exercises, Therapeutic activity, Neuromuscular re-education, Gait training, Patient/Family education, Self Care, Electrical stimulation, Cryotherapy, Vasopneumatic device, and Manual therapy  PLAN FOR NEXT SESSION: Nustep, PROM, VMS to left quadriceps. Progress per TKA protocol.  LE elevation and vasopneumatic.  Granville Lewis, PT 03/02/2023, 12:44 PM

## 2023-03-07 ENCOUNTER — Encounter: Payer: Self-pay | Admitting: *Deleted

## 2023-03-07 ENCOUNTER — Ambulatory Visit: Payer: Medicare PPO | Admitting: *Deleted

## 2023-03-07 DIAGNOSIS — M6281 Muscle weakness (generalized): Secondary | ICD-10-CM | POA: Diagnosis not present

## 2023-03-07 DIAGNOSIS — M25562 Pain in left knee: Secondary | ICD-10-CM | POA: Diagnosis not present

## 2023-03-07 DIAGNOSIS — G8929 Other chronic pain: Secondary | ICD-10-CM

## 2023-03-07 DIAGNOSIS — R600 Localized edema: Secondary | ICD-10-CM | POA: Diagnosis not present

## 2023-03-07 DIAGNOSIS — M25662 Stiffness of left knee, not elsewhere classified: Secondary | ICD-10-CM | POA: Diagnosis not present

## 2023-03-07 DIAGNOSIS — R262 Difficulty in walking, not elsewhere classified: Secondary | ICD-10-CM | POA: Diagnosis not present

## 2023-03-07 NOTE — Therapy (Signed)
OUTPATIENT PHYSICAL THERAPY LOWER EXTREMITY TREATMENT   Patient Name: Timothy Estes MRN: [id number] DOB:11/26/1950, 72 y.o., male Today's Date: 03/07/2023  END OF SESSION:  PT End of Session - 03/07/23 0845     Visit Number 17    Number of Visits 24    Date for PT Re-Evaluation 03/30/23    Authorization Type FOTO.    PT Start Time 0845    PT Stop Time 0932    PT Time Calculation (min) 47 min               Past Medical History:  Diagnosis Date   Anemia    Arthritis    Atrial fibrillation with RVR (HCC)    converted to NSR with IV dilt 10/12 - no coumadin due to need for Plavix and ASA   Cataract    Clotting disorder (HCC)    PE 2012   Colon polyps 10/2015   (non-adenomatous)   Coronary artery disease    LHC 03/17/11: Dx 30%, OM1 30%, OM2 30%, pRCA 30%, EF 40-45% with anterolat and apical HK   Diverticulosis 10/2015   noted on colonoscopy   Elevated LFTs    Fatty liver    Hematochezia    LIKEY FROM HEMORRHOIDAL BLEEDING; PT HAS H/O   Hyperlipidemia    Hypertension    Internal hemorrhoids 10/2015   noted on colonoscopy   NSTEMI (non-ST elevated myocardial infarction) (HCC)    2012-FLET THAT NON-ST-ELEVATION MI IS POSSIBLY SECONDARY TO CORONARY EMBOLUS FROM HIS AFIB   Syncope    in setting of AFib with RVR   Past Surgical History:  Procedure Laterality Date   CARDIAC CATHETERIZATION  03/18/11   cardiac event monitor  04/01/11   CATARACT EXTRACTION W/PHACO Left 09/18/2014   Procedure: CATARACT EXTRACTION PHACO AND INTRAOCULAR LENS PLACEMENT LEFT EYE;  Surgeon: Gemma Payor, MD;  Location: [facility];  Service: Ophthalmology;  Laterality: Left;  CDE 13.32   COLONOSCOPY     ESOPHAGOGASTRODUODENOSCOPY ENDOSCOPY     MOUTH SURGERY     TOTAL HIP ARTHROPLASTY Left 07/10/2018   Procedure: TOTAL HIP ARTHROPLASTY ANTERIOR APPROACH;  Surgeon: Durene Romans, MD;  Location: [facility];  Service: Orthopedics;  Laterality: Left;  70 mins   TOTAL KNEE ARTHROPLASTY Left 01/10/2023    Procedure: TOTAL KNEE ARTHROPLASTY;  Surgeon: Durene Romans, MD;  Location: [facility];  Service: Orthopedics;  Laterality: Left;   TRANSTHORACIC ECHOCARDIOGRAM  03/19/11   WISDOM TOOTH EXTRACTION     Patient Active Problem List   Diagnosis Date Noted   S/P total knee arthroplasty, left 01/10/2023   Obese 07/11/2018   S/P left THA 07/10/2018   Iron deficiency anemia 11/08/2017   Gout of knee 10/11/2016   Impaired fasting glucose 07/24/2014   Mixed hyperlipidemia 07/10/2012   Elevated LFTs 04/01/2011   NSTEMI (non-ST elevated myocardial infarction) (HCC) 04/01/2011   Atrial fibrillation (HCC) 04/01/2011   Essential hypertension 04/01/2011   HLD (hyperlipidemia) 04/01/2011   Nonobstructive CAD by Cath 02/2011 04/01/2011    REFERRING PROVIDER: Durene Romans MD  REFERRING DIAG: S/p left total knee replacement.  THERAPY DIAG:  Chronic pain of left knee  Stiffness of left knee, not elsewhere classified  Localized edema  Rationale for Evaluation and Treatment: Rehabilitation  ONSET DATE: 01/10/23.  SUBJECTIVE:   SUBJECTIVE STATEMENT: Patient arrived today LT knee pain 2-3/10 when not stretching. To MD Thursday  PERTINENT HISTORY: Left THA.  PAIN:  Are you having pain? Yes: NPRS scale: 2-3/10 Pain location: Left knee. Pain  description: Sore Aggravating factors: Knee ROM Relieving factors: Rest.  PRECAUTIONS: Other: No ultrasound.  FALLS:  Has patient fallen in last 6 months? Yes. Number of falls 2.  PATIENT GOALS: Get around better without knee pain.  Play drums.  OBJECTIVE: all objective measures were assessed at his initial evaluation on 01/13/23 unless otherwise noted  PATIENT SURVEYS:  FOTO    OBSERVATION: Aquacel removed by MD   EDEMA:  Circumferential: Left knee: 51.1 cm, Right knee: 44.2 cm  PALPATION: C/o diffuse left knee pain currently.  LOWER EXTREMITY ROM: In supine:  Left knee extension is -20 degrees and passive to -15 degrees (right knee is -10  degrees).  Flexion achieved to 75 degrees.  LOWER EXTREMITY ROM:     Active/Passive   Left 02/02/23 02/16/23 02/23/23  Flexion  Post-stretch 90A/95P 95 degs Passive(100 degrees)  Extension   -25 degs -23 degrees   LOWER EXTREMITY MMT: Patient unable to perform a left antigravity SLR.  Left hip flexion and abduction graded grossly at 2-/5.  He is unable to perform a left SAQ.  TRANSFERS: Patient requires manual assist to transition from sit to supine and supine to sit.  GAIT:  Using rollator but L knee extension still greatly limited. None to minimal heel strike with gait.  TODAY'S TREATMENT:                                                                                                                              DATE:                                   03/07/23 EXERCISE LOG  Exercise Repetitions and Resistance Comments  Nustep  L4 x 18 minutes; seat 10,9,8   TRY seat 7 next Rx  Low load prolonged knee extension stretch    With vaso   Blank cell = exercise not performed today  Manual Therapy Soft Tissue Mobilization: left quadriceps, hip adductors, and hamstrings, for improved soft tissue extensibility Joint Mobilizations: left patella,   Passive ROM: flexion and extension, with overpressure to tolerance     Date:  Vaso: Knee,  ,   mins, Pain                                    02/28/23: EXERCISE LOG  Exercise Repetitions and Resistance Comments  Nustep Level 2 moving seat forward x 3 to increase flexion..                   In supine and seated:  Prolonged stretching into flexion and extension (overpressure, SLR, and seated flexion) x 20 minutes and left patellar mobs x 3 minutes f/b LE elevation and vasopneumatic x 15 minutes (this also facilitating an extension stretch).  02/23/23: EXERCISE LOG  Exercise Repetitions and Resistance Comments  Nustep  Level 3 x 15 minutes moving seat forward x 2 to increase    4 inch steps on left in  parallel bars  2 minutes   Rockerboard In parallel bars x 3 minutes           In supine:  Sustained stretching/PROM to patient left knee into extension 7 minutes f/b flexion x 7 minutes f/b  LE elevation and vasopneumatic x 20 minutes with pillow under heel to help facilitate extension.  PATIENT EDUCATION:  Education details: See below. Person educated: Patient, spouse Education method: Explanation, demo Education comprehension: verbalized understanding  HOME EXERCISE PROGRAM: HOME EXERCISE PROGRAM Created by [place] Applegate Oct 8th, 2024 View at [email or web address] using code: [email or web address]  Page 1 of 1 2 Exercises PRONE QUAD STRETCH WITH BELT OR STRAP Start by lying on your stomach with a strap or 2 belts linked together and looped it around your affected side ankle. Next, use the belt to pull the knee into a bent position allowing for a stretch as shown. Repeat 10 Times Hold 30 Seconds Complete 1 Set Perform 4 Times a Day PRONE KNEE HANGS While lying down on your stomach, allow your leg to hang off the end of a table/bed. Position yourself so that your knee cap is just over the end of the table/bed.  Just relax your body and allow gravity to stretch your knee into a more straightened position. Add an ankle weight for increased stretch. Repeat 1 Time Hold 10 Minutes Complete 1 Set Perform 3 Times a Day ASSESSMENT:  CLINICAL IMPRESSION:  Pt arrived today  in good spirits and was able to continue therex for progression of ROM as well as tolerate manual  PROM for flexion and extension LT knee. To MD after next visit.    OBJECTIVE IMPAIRMENTS: Abnormal gait, decreased activity tolerance, decreased mobility, difficulty walking, decreased ROM, decreased strength, increased edema, and pain.   ACTIVITY LIMITATIONS: carrying, lifting, bending, standing, stairs, transfers, bed mobility, dressing, and locomotion level  PARTICIPATION LIMITATIONS: meal prep, cleaning, laundry,  driving, community activity, and yard work  PERSONAL FACTORS: Time since onset of injury/illness/exacerbation are also affecting patient's functional outcome.   REHAB POTENTIAL: Excellent  CLINICAL DECISION MAKING: Stable/uncomplicated  EVALUATION COMPLEXITY: Low  GOALS:  SHORT TERM GOALS: Target date: 01/27/23  Ind with an initial HEP. Goal status: MET  2.  Active left knee extension equal to right.  Goal status: On going   LONG TERM GOALS: Target date: 02/24/23.  Ind with an advanced HEP.  Goal status: On going  2.  Active left knee flexion to 115 degrees+ so the patient can perform functional tasks and do so with pain not > 2-3/10.  Goal status: On going  3.  Increase left hip and knee strength to a solid 4+/5 to provide good stability for accomplishment of functional activities.  Goal status: On going  4.  Perform a reciprocating stair gait with one railing with pain not > 2-3/10.  Goal status: On going  5.  Return to playing the drums.  Goal status: On going  6.  Walk in clinic 250 feet without assistive device with a normal gait pattern. Goal status: On going  PLAN:  PT FREQUENCY:  2-3 times a week.  PT DURATION: 4 weeks  PLANNED INTERVENTIONS: Therapeutic exercises, Therapeutic activity, Neuromuscular re-education, Gait training, Patient/Family education, Self Care, Electrical stimulation, Cryotherapy, Vasopneumatic device, and Manual therapy  PLAN FOR NEXT SESSION:  Nustep, PROM, VMS to left quadriceps. Progress per TKA protocol.  LE elevation and vasopneumatic.  Brannen Koppen,CHRIS, PTA 03/07/2023, 9:40 AM

## 2023-03-07 NOTE — Progress Notes (Unsigned)
No chief complaint on file.  Patient presents for f/u on chronic problems.  Since he was last here, he underwent L total knee replacement.  Hypertension follow-up:  He is compliant with metoprolol 25mg  BID and denies side effects. Denies headaches, dizziness, chest pain, edema, dyspnea with exertion.  Blood pressure at home is running    BP's had been high prior to his knee replacement, pain being a contributing factor. BP Readings from Last 3 Encounters:  01/11/23 128/71  12/28/22 (!) 194/95  12/22/22 (!) 178/90     H/o gout--he stopped taking allopurinol after his physical (uric acid level was 3.7, on 100mg  allopurinol daily).  Repeat off the medication remained good at 5.9.  He hasn't had any gout flares since stopping the allopurinol, and continues to not drink any alcohol.    Hyperlipidemia: He continues on rosuvastatin, zetia, Niaspan 500 mg, and fish oil.  We decreased his niaspan dose from 1000 mg to 500 mg after his physical due to his labs being at goal, improved diet (with no further alcohol), and some flushing side effects.  His lipids remained good, and his flushing side effect had resolved on the lower dose.We had discussed trial of stopping the niaspan, but he preferred to stay on the lower dose, since tolerating it fine. Will plan to recheck at his  next physical.  Denies changes to his diet-- Per his physical: His diet includes: 1% milk, has 2-3 eggs/week, cut back on cheese.  Red meat 1x/week.  Mostly lots of chicken, fried foods 0-2x/week, when out. He hasn't been eating out much lately.     Lab Results  Component Value Date   CHOL 140 10/25/2022   HDL 52 10/25/2022   LDLCALC 67 10/25/2022   TRIG 118 10/25/2022   CHOLHDL 2.7 10/25/2022   Paroxysmal atrial fibrillation and coronary artery disease, status post non-ST elevation MI, with preserved left ventricular systolic function: He is on Xarelto and ASA 81mg , and is on metoprolol. He has been maintaining sinus  rhythm, as far as he can tell. He denies any significant bleeding or bruising. His last visit with Dr. Ladona Ridgel was in 05/2021, no changes were made. Per notes, 2 year f/u was recommended.  Lab Results  Component Value Date   WBC 12.2 (H) 01/11/2023   HGB 12.3 (L) 01/11/2023   HCT 36.7 (L) 01/11/2023   MCV 88.9 01/11/2023   PLT 145 (L) 01/11/2023   Elevated LFT's, probably NASH:  Ultrasound in the past showed fatty liver changes, last done in 2012. NASH FibroSURE testing indicated some fibrosis of the liver, with probable NASH (non-alcoholic steatohepatitis).  Repeat LFT's had improved somewhat. He no longer drinks any alcohol.   Lab Results  Component Value Date   ALT 30 09/01/2022   AST 51 (H) 09/01/2022   ALKPHOS 71 09/01/2022   BILITOT 0.7 09/01/2022    PMH, PSH, SH reviewed   ROS:  No fever, chills, URI or allergy symptoms, cough, shortness of breath. No heart burn, n/v/d or other bowel changes. No abdominal pain. Denies urinary concerns. No bleeding, no unexplained bruising. Moods are good, no depression, anxiety, insomnia. Flushing?  Joint pains? (L knee, gout)    PHYSICAL EXAM:  There were no vitals taken for this visit.  Wt Readings from Last 3 Encounters:  01/10/23 212 lb 1.3 oz (96.2 kg)  12/28/22 212 lb (96.2 kg)  12/22/22 220 lb (99.8 kg)   Pleasant male in good spirits.  HEENT: PERRL, EOMI, conjunctiva and sclera are  clear.  Neck: no lymphadenopathy, thyromegaly or mass Heart: regular rate and rhythm, 2/6 SEM at LUSB Lungs: clear bilaterally Abdomen: soft, nontender, no organomegaly or mass Back: no spinal or CVA tenderness Extremities: no edema.   Neuro: alert and oriented. Antalgic gait, uses cane Psych: normal mood, affect, hygiene and grooming  UPDATE *** knee/neuro    ASSESSMENT/PLAN:   Flu and COVID shots  No labs needed today  C-met, lipids, cbc, PSA, uric acid to be done for his 08/2023 CPE. Labs prior, or fasting visit?

## 2023-03-08 ENCOUNTER — Other Ambulatory Visit: Payer: Self-pay | Admitting: Internal Medicine

## 2023-03-08 ENCOUNTER — Encounter: Payer: Self-pay | Admitting: Family Medicine

## 2023-03-08 ENCOUNTER — Ambulatory Visit: Payer: Medicare PPO | Admitting: Family Medicine

## 2023-03-08 VITALS — BP 132/82 | HR 68 | Ht 70.0 in | Wt 221.8 lb

## 2023-03-08 DIAGNOSIS — Z23 Encounter for immunization: Secondary | ICD-10-CM

## 2023-03-08 DIAGNOSIS — E6609 Other obesity due to excess calories: Secondary | ICD-10-CM | POA: Diagnosis not present

## 2023-03-08 DIAGNOSIS — Z7901 Long term (current) use of anticoagulants: Secondary | ICD-10-CM

## 2023-03-08 DIAGNOSIS — M109 Gout, unspecified: Secondary | ICD-10-CM

## 2023-03-08 DIAGNOSIS — I480 Paroxysmal atrial fibrillation: Secondary | ICD-10-CM

## 2023-03-08 DIAGNOSIS — Z6831 Body mass index (BMI) 31.0-31.9, adult: Secondary | ICD-10-CM

## 2023-03-08 DIAGNOSIS — E66811 Obesity, class 1: Secondary | ICD-10-CM | POA: Diagnosis not present

## 2023-03-08 DIAGNOSIS — I10 Essential (primary) hypertension: Secondary | ICD-10-CM | POA: Diagnosis not present

## 2023-03-08 DIAGNOSIS — E782 Mixed hyperlipidemia: Secondary | ICD-10-CM

## 2023-03-08 DIAGNOSIS — K760 Fatty (change of) liver, not elsewhere classified: Secondary | ICD-10-CM

## 2023-03-08 NOTE — Patient Instructions (Addendum)
If you want to do a trial of stopping the Niaspan entirely, I recommend that you stop taking it the end of March or early April (to have at least 6 weeks without any medication on board) continue the rosuvastatin and zetia, just stop the Niaspan. We can then check your cholesterol at your physical, and see if it needs to be restarted (if your cholesterol looks worse).  If you forget to stop it, no worries, we can arrange for a follow-up cholesterol panel when you are here.  Please be sure to be mindful of your diet--keeping your portions smaller (especially when you aren't getting as much activity), and making good food choices.  Be sure to eat lots of fruit and vegetables, limit your breads, rice, pasta and sweets. Hopefully with watching your diet a little closer, and getting released to do more activities, you will start to lose some of the weight that you gained (and hopefully more).

## 2023-03-09 ENCOUNTER — Ambulatory Visit: Payer: Medicare PPO

## 2023-03-09 DIAGNOSIS — M25562 Pain in left knee: Secondary | ICD-10-CM | POA: Diagnosis not present

## 2023-03-09 DIAGNOSIS — G8929 Other chronic pain: Secondary | ICD-10-CM | POA: Diagnosis not present

## 2023-03-09 DIAGNOSIS — R600 Localized edema: Secondary | ICD-10-CM | POA: Diagnosis not present

## 2023-03-09 DIAGNOSIS — M6281 Muscle weakness (generalized): Secondary | ICD-10-CM

## 2023-03-09 DIAGNOSIS — M25662 Stiffness of left knee, not elsewhere classified: Secondary | ICD-10-CM | POA: Diagnosis not present

## 2023-03-09 DIAGNOSIS — R262 Difficulty in walking, not elsewhere classified: Secondary | ICD-10-CM

## 2023-03-09 NOTE — Therapy (Signed)
OUTPATIENT PHYSICAL THERAPY LOWER EXTREMITY TREATMENT   Patient Name: Timothy Estes MRN: [id number] DOB:1950-09-07, 72 y.o., male Today's Date: 03/09/2023  END OF SESSION:  PT End of Session - 03/09/23 0835     Visit Number 18    Number of Visits 24    Date for PT Re-Evaluation 03/30/23    Authorization Type FOTO.    PT Start Time 0830    PT Stop Time 0937    PT Time Calculation (min) 67 min    Activity Tolerance Patient tolerated treatment well    Behavior During Therapy WFL for tasks assessed/performed                Past Medical History:  Diagnosis Date   Anemia    Arthritis    Atrial fibrillation with RVR (HCC)    converted to NSR with IV dilt 10/12 - no coumadin due to need for Plavix and ASA   Cataract    Clotting disorder (HCC)    PE 2012   Colon polyps 10/2015   (non-adenomatous)   Coronary artery disease    LHC 03/17/11: Dx 30%, OM1 30%, OM2 30%, pRCA 30%, EF 40-45% with anterolat and apical HK   Diverticulosis 10/2015   noted on colonoscopy   Elevated LFTs    Fatty liver    Hematochezia    LIKEY FROM HEMORRHOIDAL BLEEDING; PT HAS H/O   Hyperlipidemia    Hypertension    Internal hemorrhoids 10/2015   noted on colonoscopy   NSTEMI (non-ST elevated myocardial infarction) (HCC)    2012-FLET THAT NON-ST-ELEVATION MI IS POSSIBLY SECONDARY TO CORONARY EMBOLUS FROM HIS AFIB   Syncope    in setting of AFib with RVR   Past Surgical History:  Procedure Laterality Date   CARDIAC CATHETERIZATION  03/18/11   cardiac event monitor  04/01/11   CATARACT EXTRACTION W/PHACO Left 09/18/2014   Procedure: CATARACT EXTRACTION PHACO AND INTRAOCULAR LENS PLACEMENT LEFT EYE;  Surgeon: Gemma Payor, MD;  Location: [facility];  Service: Ophthalmology;  Laterality: Left;  CDE 13.32   COLONOSCOPY     ESOPHAGOGASTRODUODENOSCOPY ENDOSCOPY     MOUTH SURGERY     TOTAL HIP ARTHROPLASTY Left 07/10/2018   Procedure: TOTAL HIP ARTHROPLASTY ANTERIOR APPROACH;  Surgeon: Durene Romans, MD;   Location: [facility];  Service: Orthopedics;  Laterality: Left;  70 mins   TOTAL KNEE ARTHROPLASTY Left 01/10/2023   Procedure: TOTAL KNEE ARTHROPLASTY;  Surgeon: Durene Romans, MD;  Location: [facility];  Service: Orthopedics;  Laterality: Left;   TRANSTHORACIC ECHOCARDIOGRAM  03/19/11   WISDOM TOOTH EXTRACTION     Patient Active Problem List   Diagnosis Date Noted   S/P total knee arthroplasty, left 01/10/2023   Obese 07/11/2018   S/P left THA 07/10/2018   Iron deficiency anemia 11/08/2017   Gout of knee 10/11/2016   Impaired fasting glucose 07/24/2014   Mixed hyperlipidemia 07/10/2012   Elevated LFTs 04/01/2011   NSTEMI (non-ST elevated myocardial infarction) (HCC) 04/01/2011   Atrial fibrillation (HCC) 04/01/2011   Essential hypertension 04/01/2011   HLD (hyperlipidemia) 04/01/2011   Nonobstructive CAD by Cath 02/2011 04/01/2011    REFERRING PROVIDER: Durene Romans MD  REFERRING DIAG: S/p left total knee replacement.  THERAPY DIAG:  Chronic pain of left knee  Stiffness of left knee, not elsewhere classified  Localized edema  Muscle weakness (generalized)  Difficulty in walking, not elsewhere classified  Rationale for Evaluation and Treatment: Rehabilitation  ONSET DATE: 01/10/23.  SUBJECTIVE:   SUBJECTIVE STATEMENT: Patient reports that  his knee is not hurting today and he was able to was 3 cars yesterday. He feels that his knee is about 60% better as he can do a lot more than he was previously. He is scheduled to see his surgeon later today. He was able to walk about 30 minutes four different times yesterday.   PERTINENT HISTORY: Left THA.  PAIN:  Are you having pain? Yes: NPRS scale: 0/10 Pain location: Left knee. Pain description: Sore Aggravating factors: Knee ROM Relieving factors: Rest.  PRECAUTIONS: Other: No ultrasound.  FALLS:  Has patient fallen in last 6 months? Yes. Number of falls 2.  PATIENT GOALS: Get around better without knee pain.  Play  drums.  OBJECTIVE: all objective measures were assessed at his initial evaluation on 01/13/23 unless otherwise noted  PATIENT SURVEYS:  FOTO    OBSERVATION: Aquacel removed by MD   EDEMA:  Circumferential: Left knee: 51.1 cm, Right knee: 44.2 cm  PALPATION: C/o diffuse left knee pain currently.  LOWER EXTREMITY ROM: In supine:  Left knee extension is -20 degrees and passive to -15 degrees (right knee is -10 degrees).  Flexion achieved to 75 degrees.  LOWER EXTREMITY ROM:     Active/Passive   Left 02/02/23 02/16/23 02/23/23 03/09/23  Flexion  Post-stretch 90A/95P 95 degs Passive(100 degrees) 103/112 (PROM)  Extension   -25 degs -23 degrees -12 degrees   LOWER EXTREMITY MMT: Patient unable to perform a left antigravity SLR.  Left hip flexion and abduction graded grossly at 2-/5.  He is unable to perform a left SAQ.  TRANSFERS: Patient requires manual assist to transition from sit to supine and supine to sit.  GAIT:  Using rollator but L knee extension still greatly limited. None to minimal heel strike with gait.  TODAY'S TREATMENT:                                                                                                                              DATE:                                   03/09/23 EXERCISE LOG  Exercise Repetitions and Resistance Comments  NuStep  L4 x 17 minutes; seat 10-7   Standing gastroc stretch  3 minutes    Lunges onto step  8" step x 3 minutes w/ 10 second hold  LLE only   Quad set  2.5 w/ 5 second hold  LLE only   Thomas stretch  2 minutes  LLE only   Low load prolonged hold stretch for knee extension 7 minutes  With vaso    Blank cell = exercise not performed today  Manual Therapy Soft Tissue Mobilization: left quadriceps, for reduced pain and improved soft tissue extensibility Joint Mobilizations: tibiofemoral and patellar, grade I-IV Passive ROM: left knee flexion and extension, with overpressure to tolerance   Modalities  Date:  Vaso:  Knee, 34 degrees; low pressure, 7 mins, Pain                                   03/07/23 EXERCISE LOG  Exercise Repetitions and Resistance Comments  Nustep  L4 x 18 minutes; seat 10,9,8   TRY seat 7 next Rx  Low load prolonged knee extension stretch    With vaso   Blank cell = exercise not performed today  Manual Therapy Soft Tissue Mobilization: left quadriceps, hip adductors, and hamstrings, for improved soft tissue extensibility Joint Mobilizations: left patella,   Passive ROM: flexion and extension, with overpressure to tolerance     Date:  Vaso: Knee,  ,   mins, Pain                                    02/28/23: EXERCISE LOG  Exercise Repetitions and Resistance Comments  Nustep Level 2 moving seat forward x 3 to increase flexion..                   In supine and seated:  Prolonged stretching into flexion and extension (overpressure, SLR, and seated flexion) x 20 minutes and left patellar mobs x 3 minutes f/b LE elevation and vasopneumatic x 15 minutes (this also facilitating an extension stretch).    PATIENT EDUCATION:  Education details: See below. Person educated: Patient, spouse Education method: Explanation, demo Education comprehension: verbalized understanding  HOME EXERCISE PROGRAM: HOME EXERCISE PROGRAM Created by [place] Applegate Oct 8th, 2024 View at [email or web address] using code: [email or web address]  Page 1 of 1 2 Exercises PRONE QUAD STRETCH WITH BELT OR STRAP Start by lying on your stomach with a strap or 2 belts linked together and looped it around your affected side ankle. Next, use the belt to pull the knee into a bent position allowing for a stretch as shown. Repeat 10 Times Hold 30 Seconds Complete 1 Set Perform 4 Times a Day PRONE KNEE HANGS While lying down on your stomach, allow your leg to hang off the end of a table/bed. Position yourself so that your knee cap is just over the end of the table/bed.  Just relax your body and allow gravity to stretch  your knee into a more straightened position. Add an ankle weight for increased stretch. Repeat 1 Time Hold 10 Minutes Complete 1 Set Perform 3 Times a Day ASSESSMENT:  CLINICAL IMPRESSION:  Patient is making fair progress with skilled physical therapy as evidenced by his subjective reports, functional mobility, objective measures, and progress towards his goals.  He has been able to demonstrate a significant improvement in left knee flexion and extension since 02/23/23.  However, he has yet to meet his short or long-term goals for these motions.  Pain remains his primary limitation with left knee active and passive range of motion as he has a empty end feel with passive knee flexion.  Today's interventions focused on improved knee mobility through the use of active and passive interventions.  He reported that his knee felt alright upon the conclusion of treatment.  Recommend that he continue with skilled physical therapy to address his impairments to maximize his functional mobility and safety.  OBJECTIVE IMPAIRMENTS: Abnormal gait, decreased activity tolerance, decreased mobility, difficulty walking, decreased ROM, decreased strength, increased edema, and pain.   ACTIVITY LIMITATIONS: carrying, lifting, bending, standing, stairs, transfers, bed  mobility, dressing, and locomotion level  PARTICIPATION LIMITATIONS: meal prep, cleaning, laundry, driving, community activity, and yard work  PERSONAL FACTORS: Time since onset of injury/illness/exacerbation are also affecting patient's functional outcome.   REHAB POTENTIAL: Excellent  CLINICAL DECISION MAKING: Stable/uncomplicated  EVALUATION COMPLEXITY: Low  GOALS:  SHORT TERM GOALS: Target date: 01/27/23  Ind with an initial HEP. Goal status: MET  2.  Active left knee extension equal to right.   Baseline: 12 degrees from neutral on LLE Goal status: On going   LONG TERM GOALS: Target date: 02/24/23.  Ind with an advanced HEP.  Goal  status: On going  2.  Active left knee flexion to 115 degrees+ so the patient can perform functional tasks and do so with pain not > 2-3/10.   Baseline: 103 degrees Goal status: On going  3.  Increase left hip and knee strength to a solid 4+/5 to provide good stability for accomplishment of functional activities.   Baseline:  Goal status: On going  4.  Perform a reciprocating stair gait with one railing with pain not > 2-3/10.   Baseline: utilizes step to pattern  Goal status: On going  5.  Return to playing the drums.  Goal status: MET  6.  Walk in clinic 250 feet without assistive device with a normal gait pattern.  Baseline: continues to utilize Rolator with gait deviations Goal status: On going  PLAN:  PT FREQUENCY:  2-3 times a week.  PT DURATION: 4 weeks  PLANNED INTERVENTIONS: Therapeutic exercises, Therapeutic activity, Neuromuscular re-education, Gait training, Patient/Family education, Self Care, Electrical stimulation, Cryotherapy, Vasopneumatic device, and Manual therapy  PLAN FOR NEXT SESSION: Nustep, PROM, VMS to left quadriceps. Progress per TKA protocol.  LE elevation and vasopneumatic.  Granville Lewis, PT 03/09/2023, 6:26 PM

## 2023-03-09 NOTE — Telephone Encounter (Signed)
Prescription refill request for Xarelto received.  Indication: afib  Last office visit: Timothy Estes 12/22/2022 Weight: 100.6 kg Age: 72 yo  Scr: 1.10, 01/11/2023 CrCl: 88 ml/min   Refill sent.

## 2023-03-14 ENCOUNTER — Ambulatory Visit: Payer: Medicare PPO | Admitting: *Deleted

## 2023-03-14 ENCOUNTER — Encounter: Payer: Self-pay | Admitting: *Deleted

## 2023-03-14 DIAGNOSIS — M6281 Muscle weakness (generalized): Secondary | ICD-10-CM

## 2023-03-14 DIAGNOSIS — R262 Difficulty in walking, not elsewhere classified: Secondary | ICD-10-CM | POA: Diagnosis not present

## 2023-03-14 DIAGNOSIS — R600 Localized edema: Secondary | ICD-10-CM

## 2023-03-14 DIAGNOSIS — M25662 Stiffness of left knee, not elsewhere classified: Secondary | ICD-10-CM

## 2023-03-14 DIAGNOSIS — G8929 Other chronic pain: Secondary | ICD-10-CM | POA: Diagnosis not present

## 2023-03-14 DIAGNOSIS — M25562 Pain in left knee: Secondary | ICD-10-CM | POA: Diagnosis not present

## 2023-03-14 NOTE — Therapy (Signed)
OUTPATIENT PHYSICAL THERAPY LOWER EXTREMITY TREATMENT   Patient Name: Timothy Estes MRN: [id number] DOB:[birth year], 72 y.o., male Today's Date: 03/14/2023  END OF SESSION:  PT End of Session - 03/14/23 0845     Visit Number 19    Number of Visits 24    Date for PT Re-Evaluation 03/30/23    Authorization Type FOTO.    PT Start Time 0845    PT Stop Time 0935    PT Time Calculation (min) 50 min                Past Medical History:  Diagnosis Date   Anemia    Arthritis    Atrial fibrillation with RVR (HCC)    converted to NSR with IV dilt 10/12 - no coumadin due to need for Plavix and ASA   Cataract    Clotting disorder (HCC)    PE 2012   Colon polyps 10/2015   (non-adenomatous)   Coronary artery disease    LHC 03/17/11: Dx 30%, OM1 30%, OM2 30%, pRCA 30%, EF 40-45% with anterolat and apical HK   Diverticulosis 10/2015   noted on colonoscopy   Elevated LFTs    Fatty liver    Hematochezia    LIKEY FROM HEMORRHOIDAL BLEEDING; PT HAS H/O   Hyperlipidemia    Hypertension    Internal hemorrhoids 10/2015   noted on colonoscopy   NSTEMI (non-ST elevated myocardial infarction) (HCC)    2012-FLET THAT NON-ST-ELEVATION MI IS POSSIBLY SECONDARY TO CORONARY EMBOLUS FROM HIS AFIB   Syncope    in setting of AFib with RVR   Past Surgical History:  Procedure Laterality Date   CARDIAC CATHETERIZATION  03/18/11   cardiac event monitor  04/01/11   CATARACT EXTRACTION W/PHACO Left 09/18/2014   Procedure: CATARACT EXTRACTION PHACO AND INTRAOCULAR LENS PLACEMENT LEFT EYE;  Surgeon: Gemma Payor, MD;  Location: [facility];  Service: Ophthalmology;  Laterality: Left;  CDE 13.32   COLONOSCOPY     ESOPHAGOGASTRODUODENOSCOPY ENDOSCOPY     MOUTH SURGERY     TOTAL HIP ARTHROPLASTY Left 07/10/2018   Procedure: TOTAL HIP ARTHROPLASTY ANTERIOR APPROACH;  Surgeon: Durene Romans, MD;  Location: [facility];  Service: Orthopedics;  Laterality: Left;  70 mins   TOTAL KNEE ARTHROPLASTY Left 01/10/2023    Procedure: TOTAL KNEE ARTHROPLASTY;  Surgeon: Durene Romans, MD;  Location: [facility];  Service: Orthopedics;  Laterality: Left;   TRANSTHORACIC ECHOCARDIOGRAM  03/19/11   WISDOM TOOTH EXTRACTION     Patient Active Problem List   Diagnosis Date Noted   S/P total knee arthroplasty, left 01/10/2023   Obese 07/11/2018   S/P left THA 07/10/2018   Iron deficiency anemia 11/08/2017   Gout of knee 10/11/2016   Impaired fasting glucose 07/24/2014   Mixed hyperlipidemia 07/10/2012   Elevated LFTs 04/01/2011   NSTEMI (non-ST elevated myocardial infarction) (HCC) 04/01/2011   Atrial fibrillation (HCC) 04/01/2011   Essential hypertension 04/01/2011   HLD (hyperlipidemia) 04/01/2011   Nonobstructive CAD by Cath 02/2011 04/01/2011    REFERRING PROVIDER: Durene Romans MD  REFERRING DIAG: S/p left total knee replacement.  THERAPY DIAG:  Chronic pain of left knee  Stiffness of left knee, not elsewhere classified  Localized edema  Muscle weakness (generalized)  Difficulty in walking, not elsewhere classified  Rationale for Evaluation and Treatment: Rehabilitation  ONSET DATE: 01/10/23.  SUBJECTIVE:   SUBJECTIVE STATEMENT:   PERTINENT HISTORY: Left THA.  PAIN:  Are you having pain? Yes: NPRS scale: 0/10 Pain location: Left knee. Pain  description: Sore Aggravating factors: Knee ROM Relieving factors: Rest.  PRECAUTIONS: Other: No ultrasound.  FALLS:  Has patient fallen in last 6 months? Yes. Number of falls 2.  PATIENT GOALS: Get around better without knee pain.  Play drums.  OBJECTIVE: all objective measures were assessed at his initial evaluation on 01/13/23 unless otherwise noted  PATIENT SURVEYS:  FOTO    OBSERVATION: Aquacel removed by MD   EDEMA:  Circumferential: Left knee: 51.1 cm, Right knee: 44.2 cm  PALPATION: C/o diffuse left knee pain currently.  LOWER EXTREMITY ROM: In supine:  Left knee extension is -20 degrees and passive to -15 degrees (right knee is  -10 degrees).  Flexion achieved to 75 degrees.  LOWER EXTREMITY ROM:     Active/Passive   Left 02/02/23 02/16/23 02/23/23 03/09/23  Flexion  Post-stretch 90A/95P 95 degs Passive(100 degrees) 103/112 (PROM)  Extension   -25 degs -23 degrees -12 degrees   LOWER EXTREMITY MMT: Patient unable to perform a left antigravity SLR.  Left hip flexion and abduction graded grossly at 2-/5.  He is unable to perform a left SAQ.  TRANSFERS: Patient requires manual assist to transition from sit to supine and supine to sit.  GAIT:  Using rollator but L knee extension still greatly limited. None to minimal heel strike with gait.  TODAY'S TREATMENT:                                                                                                                              DATE:                                   03/14/23 EXERCISE LOG  Exercise Repetitions and Resistance Comments  NuStep  L4 x 19 minutes; seat 10-7   Standing gastroc stretch Rocker board 3 minutes    Lunges onto step  8" step x 3 minutes w/ 10 second hold  LLE only   Quad set   LLE only   Thomas stretch    LLE only   LAQ's 4# x 20 hold 5 secs   Low load prolonged hold stretch for knee extension     Blank cell = exercise not performed today  Manual Therapy Soft Tissue Mobilization: to LT ITB and HS,   Joint Mobilizations:  ,   Passive ROM: left knee flexion and extension, with overpressure to tolerance  x 8 mins Modalities  Date:  Vaso: Knee,  ,   mins, Pain                                   03/07/23 EXERCISE LOG  Exercise Repetitions and Resistance Comments  Nustep  L4 x 18 minutes; seat 10,9,8   TRY seat 7 next Rx  Low load prolonged knee extension stretch    With vaso  Blank cell = exercise not performed today  Manual Therapy Soft Tissue Mobilization: left quadriceps, hip adductors, and hamstrings, for improved soft tissue extensibility Joint Mobilizations: left patella,   Passive ROM: flexion and extension, with  overpressure to tolerance     Date:  Vaso: Knee,  ,   mins, Pain                                    02/28/23: EXERCISE LOG  Exercise Repetitions and Resistance Comments  Nustep Level 2 moving seat forward x 3 to increase flexion..                   In supine and seated:  Prolonged stretching into flexion and extension (overpressure, SLR, and seated flexion) x 20 minutes and left patellar mobs x 3 minutes f/b LE elevation and vasopneumatic x 15 minutes (this also facilitating an extension stretch).    PATIENT EDUCATION:  Education details: See below. Person educated: Patient, spouse Education method: Explanation, demo Education comprehension: verbalized understanding  HOME EXERCISE PROGRAM: HOME EXERCISE PROGRAM Created by Timothy Estes Oct 8th, 2024 View at [email or web address] using code: [email or web address]  Page 1 of 1 2 Exercises PRONE QUAD STRETCH WITH BELT OR STRAP Start by lying on your stomach with a strap or 2 belts linked together and looped it around your affected side ankle. Next, use the belt to pull the knee into a bent position allowing for a stretch as shown. Repeat 10 Times Hold 30 Seconds Complete 1 Set Perform 4 Times a Day PRONE KNEE HANGS While lying down on your stomach, allow your leg to hang off the end of a table/bed. Position yourself so that your knee cap is just over the end of the table/bed.  Just relax your body and allow gravity to stretch your knee into a more straightened position. Add an ankle weight for increased stretch. Repeat 1 Time Hold 10 Minutes Complete 1 Set Perform 3 Times a Day ASSESSMENT:  CLINICAL IMPRESSION:  Pt arrived today doing fairly well and reports MD was pleased with current status and told Pt to finish out current script for PT and then DC to HEP. Rx focused on strength as well as ROM progression  and STW to LT LE/ knee and did well. Pt to Ice at home.  OBJECTIVE IMPAIRMENTS: Abnormal gait, decreased activity tolerance,  decreased mobility, difficulty walking, decreased ROM, decreased strength, increased edema, and pain.   ACTIVITY LIMITATIONS: carrying, lifting, bending, standing, stairs, transfers, bed mobility, dressing, and locomotion level  PARTICIPATION LIMITATIONS: meal prep, cleaning, laundry, driving, community activity, and yard work  PERSONAL FACTORS: Time since onset of injury/illness/exacerbation are also affecting patient's functional outcome.   REHAB POTENTIAL: Excellent  CLINICAL DECISION MAKING: Stable/uncomplicated  EVALUATION COMPLEXITY: Low  GOALS:  SHORT TERM GOALS: Target date: 01/27/23  Ind with an initial HEP. Goal status: MET  2.  Active left knee extension equal to right.   Baseline: 12 degrees from neutral on LLE Goal status: On going   LONG TERM GOALS: Target date: 02/24/23.  Ind with an advanced HEP.  Goal status: On going  2.  Active left knee flexion to 115 degrees+ so the patient can perform functional tasks and do so with pain not > 2-3/10.   Baseline: 103 degrees Goal status: On going  3.  Increase left hip and knee strength to a solid 4+/5 to provide good stability for accomplishment  of functional activities.   Baseline:  Goal status: On going  4.  Perform a reciprocating stair gait with one railing with pain not > 2-3/10.   Baseline: utilizes step to pattern  Goal status: On going  5.  Return to playing the drums.  Goal status: MET  6.  Walk in clinic 250 feet without assistive device with a normal gait pattern.  Baseline: continues to utilize Rolator with gait deviations Goal status: On going  PLAN:  PT FREQUENCY:  2-3 times a week.  PT DURATION: 4 weeks  PLANNED INTERVENTIONS: Therapeutic exercises, Therapeutic activity, Neuromuscular re-education, Gait training, Patient/Family education, Self Care, Electrical stimulation, Cryotherapy, Vasopneumatic device, and Manual therapy  PLAN FOR NEXT SESSION: Nustep, PROM, VMS to left quadriceps.  Progress per TKA protocol.  LE elevation and vasopneumatic.  Timothy Estes,Timothy Estes, PTA 03/14/2023, 9:41 AM

## 2023-03-16 ENCOUNTER — Ambulatory Visit: Payer: Medicare PPO

## 2023-03-16 DIAGNOSIS — M25662 Stiffness of left knee, not elsewhere classified: Secondary | ICD-10-CM

## 2023-03-16 DIAGNOSIS — M25562 Pain in left knee: Secondary | ICD-10-CM | POA: Diagnosis not present

## 2023-03-16 DIAGNOSIS — R262 Difficulty in walking, not elsewhere classified: Secondary | ICD-10-CM

## 2023-03-16 DIAGNOSIS — M6281 Muscle weakness (generalized): Secondary | ICD-10-CM

## 2023-03-16 DIAGNOSIS — G8929 Other chronic pain: Secondary | ICD-10-CM | POA: Diagnosis not present

## 2023-03-16 DIAGNOSIS — R600 Localized edema: Secondary | ICD-10-CM | POA: Diagnosis not present

## 2023-03-16 NOTE — Therapy (Signed)
OUTPATIENT PHYSICAL THERAPY LOWER EXTREMITY TREATMENT   Patient Name: Timothy Estes MRN: [id number] DOB:1950-12-21, 72 y.o., male Today's Date: 03/16/2023  END OF SESSION:  PT End of Session - 03/16/23 0852     Visit Number 20    Number of Visits 24    Date for PT Re-Evaluation 03/30/23    Authorization Type FOTO.    PT Start Time 0845    PT Stop Time 0932    PT Time Calculation (min) 47 min    Activity Tolerance Patient tolerated treatment well    Behavior During Therapy WFL for tasks assessed/performed                 Past Medical History:  Diagnosis Date   Anemia    Arthritis    Atrial fibrillation with RVR (HCC)    converted to NSR with IV dilt 10/12 - no coumadin due to need for Plavix and ASA   Cataract    Clotting disorder (HCC)    PE 2012   Colon polyps 10/2015   (non-adenomatous)   Coronary artery disease    LHC 03/17/11: Dx 30%, OM1 30%, OM2 30%, pRCA 30%, EF 40-45% with anterolat and apical HK   Diverticulosis 10/2015   noted on colonoscopy   Elevated LFTs    Fatty liver    Hematochezia    LIKEY FROM HEMORRHOIDAL BLEEDING; PT HAS H/O   Hyperlipidemia    Hypertension    Internal hemorrhoids 10/2015   noted on colonoscopy   NSTEMI (non-ST elevated myocardial infarction) (HCC)    2012-FLET THAT NON-ST-ELEVATION MI IS POSSIBLY SECONDARY TO CORONARY EMBOLUS FROM HIS AFIB   Syncope    in setting of AFib with RVR   Past Surgical History:  Procedure Laterality Date   CARDIAC CATHETERIZATION  03/18/11   cardiac event monitor  04/01/11   CATARACT EXTRACTION W/PHACO Left 09/18/2014   Procedure: CATARACT EXTRACTION PHACO AND INTRAOCULAR LENS PLACEMENT LEFT EYE;  Surgeon: Gemma Payor, MD;  Location: [facility];  Service: Ophthalmology;  Laterality: Left;  CDE 13.32   COLONOSCOPY     ESOPHAGOGASTRODUODENOSCOPY ENDOSCOPY     MOUTH SURGERY     TOTAL HIP ARTHROPLASTY Left 07/10/2018   Procedure: TOTAL HIP ARTHROPLASTY ANTERIOR APPROACH;  Surgeon: Durene Romans, MD;   Location: [facility];  Service: Orthopedics;  Laterality: Left;  70 mins   TOTAL KNEE ARTHROPLASTY Left 01/10/2023   Procedure: TOTAL KNEE ARTHROPLASTY;  Surgeon: Durene Romans, MD;  Location: [facility];  Service: Orthopedics;  Laterality: Left;   TRANSTHORACIC ECHOCARDIOGRAM  03/19/11   WISDOM TOOTH EXTRACTION     Patient Active Problem List   Diagnosis Date Noted   S/P total knee arthroplasty, left 01/10/2023   Obese 07/11/2018   S/P left THA 07/10/2018   Iron deficiency anemia 11/08/2017   Gout of knee 10/11/2016   Impaired fasting glucose 07/24/2014   Mixed hyperlipidemia 07/10/2012   Elevated LFTs 04/01/2011   NSTEMI (non-ST elevated myocardial infarction) (HCC) 04/01/2011   Atrial fibrillation (HCC) 04/01/2011   Essential hypertension 04/01/2011   HLD (hyperlipidemia) 04/01/2011   Nonobstructive CAD by Cath 02/2011 04/01/2011    REFERRING PROVIDER: Durene Romans MD  REFERRING DIAG: S/p left total knee replacement.  THERAPY DIAG:  Chronic pain of left knee  Stiffness of left knee, not elsewhere classified  Localized edema  Muscle weakness (generalized)  Difficulty in walking, not elsewhere classified  Rationale for Evaluation and Treatment: Rehabilitation  ONSET DATE: 01/10/23.  SUBJECTIVE:   SUBJECTIVE STATEMENT:  Patient  reports that his knee is really sore today as he overdid it yesterday while doing yard work.   PERTINENT HISTORY: Left THA.  PAIN:  Are you having pain? Yes: NPRS scale: 2/10 Pain location: Left knee. Pain description: Sore Aggravating factors: Knee ROM Relieving factors: Rest.  PRECAUTIONS: Other: No ultrasound.  FALLS:  Has patient fallen in last 6 months? Yes. Number of falls 2.  PATIENT GOALS: Get around better without knee pain.  Play drums.  OBJECTIVE: all objective measures were assessed at his initial evaluation on 01/13/23 unless otherwise noted  PATIENT SURVEYS:  FOTO    OBSERVATION: Aquacel removed by MD   EDEMA:   Circumferential: Left knee: 51.1 cm, Right knee: 44.2 cm  PALPATION: C/o diffuse left knee pain currently.  LOWER EXTREMITY ROM: In supine:  Left knee extension is -20 degrees and passive to -15 degrees (right knee is -10 degrees).  Flexion achieved to 75 degrees.  LOWER EXTREMITY ROM:     Active/Passive   Left 02/02/23 02/16/23 02/23/23 03/09/23  Flexion  Post-stretch 90A/95P 95 degs Passive(100 degrees) 103/112 (PROM)  Extension   -25 degs -23 degrees -12 degrees   LOWER EXTREMITY MMT: Patient unable to perform a left antigravity SLR.  Left hip flexion and abduction graded grossly at 2-/5.  He is unable to perform a left SAQ.  TRANSFERS: Patient requires manual assist to transition from sit to supine and supine to sit.  GAIT:  Using rollator but L knee extension still greatly limited. None to minimal heel strike with gait.  TODAY'S TREATMENT:                                                                                                                              DATE:                                   03/16/23 EXERCISE LOG  Exercise Repetitions and Resistance Comments  Nustep L4 x 19 minutes; seat 10-9   Rocker board  5 minutes    Lunges  8" step x 3 minutes            Blank cell = exercise not performed today  Modalities: no redness or adverse reaction to today's modalities  Date:  Vaso: Knee, 34 degrees; low pressure, 15 mins, Pain                                   03/14/23 EXERCISE LOG  Exercise Repetitions and Resistance Comments  NuStep  L4 x 19 minutes; seat 10-7   Standing gastroc stretch Rocker board 3 minutes    Lunges onto step  8" step x 3 minutes w/ 10 second hold  LLE only   Quad set   LLE only   Thomas stretch    LLE only  LAQ's 4# x 20 hold 5 secs   Low load prolonged hold stretch for knee extension     Blank cell = exercise not performed today  Manual Therapy Soft Tissue Mobilization: to LT ITB and HS,   Joint Mobilizations:  ,   Passive  ROM: left knee flexion and extension, with overpressure to tolerance  x 8 mins Modalities  Date:  Vaso: Knee,  ,   mins, Pain                                   03/07/23 EXERCISE LOG  Exercise Repetitions and Resistance Comments  Nustep  L4 x 18 minutes; seat 10,9,8   TRY seat 7 next Rx  Low load prolonged knee extension stretch    With vaso   Blank cell = exercise not performed today  Manual Therapy Soft Tissue Mobilization: left quadriceps, hip adductors, and hamstrings, for improved soft tissue extensibility Joint Mobilizations: left patella,   Passive ROM: flexion and extension, with overpressure to tolerance     Date:  Vaso: Knee,  ,   mins, Pain  PATIENT EDUCATION:  Education details: See below. Person educated: Patient, spouse Education method: Explanation, demo Education comprehension: verbalized understanding  HOME EXERCISE PROGRAM: HOME EXERCISE PROGRAM Created by [place] Applegate Oct 8th, 2024 View at [email or web address] using code: [email or web address]  Page 1 of 1 2 Exercises PRONE QUAD STRETCH WITH BELT OR STRAP Start by lying on your stomach with a strap or 2 belts linked together and looped it around your affected side ankle. Next, use the belt to pull the knee into a bent position allowing for a stretch as shown. Repeat 10 Times Hold 30 Seconds Complete 1 Set Perform 4 Times a Day PRONE KNEE HANGS While lying down on your stomach, allow your leg to hang off the end of a table/bed. Position yourself so that your knee cap is just over the end of the table/bed.  Just relax your body and allow gravity to stretch your knee into a more straightened position. Add an ankle weight for increased stretch. Repeat 1 Time Hold 10 Minutes Complete 1 Set Perform 3 Times a Day ASSESSMENT:  CLINICAL IMPRESSION:  Today's treatment focused on familiar interventions for improved knee mobility as his ability to be progressed with new interventions was limited by his increased left  knee soreness. He was educated on the expectations for soreness and pacing himself with household activities to avoid significantly increasing his symptoms. He reported that his knee felt "alright" upon the conclusion of treatment. He continues to require skilled physical therapy to address his remaining impairments to maximize his functional mobility.   OBJECTIVE IMPAIRMENTS: Abnormal gait, decreased activity tolerance, decreased mobility, difficulty walking, decreased ROM, decreased strength, increased edema, and pain.   ACTIVITY LIMITATIONS: carrying, lifting, bending, standing, stairs, transfers, bed mobility, dressing, and locomotion level  PARTICIPATION LIMITATIONS: meal prep, cleaning, laundry, driving, community activity, and yard work  PERSONAL FACTORS: Time since onset of injury/illness/exacerbation are also affecting patient's functional outcome.   REHAB POTENTIAL: Excellent  CLINICAL DECISION MAKING: Stable/uncomplicated  EVALUATION COMPLEXITY: Low  GOALS:  SHORT TERM GOALS: Target date: 01/27/23  Ind with an initial HEP. Goal status: MET  2.  Active left knee extension equal to right.   Baseline: 12 degrees from neutral on LLE Goal status: On going   LONG TERM GOALS: Target date: 02/24/23.  Ind with an advanced HEP.  Goal status:  On going  2.  Active left knee flexion to 115 degrees+ so the patient can perform functional tasks and do so with pain not > 2-3/10.   Baseline: 103 degrees Goal status: On going  3.  Increase left hip and knee strength to a solid 4+/5 to provide good stability for accomplishment of functional activities.   Baseline:  Goal status: On going  4.  Perform a reciprocating stair gait with one railing with pain not > 2-3/10.   Baseline: utilizes step to pattern  Goal status: On going  5.  Return to playing the drums.  Goal status: MET  6.  Walk in clinic 250 feet without assistive device with a normal gait pattern.  Baseline: continues to  utilize Rolator with gait deviations Goal status: On going  PLAN:  PT FREQUENCY:  2-3 times a week.  PT DURATION: 4 weeks  PLANNED INTERVENTIONS: Therapeutic exercises, Therapeutic activity, Neuromuscular re-education, Gait training, Patient/Family education, Self Care, Electrical stimulation, Cryotherapy, Vasopneumatic device, and Manual therapy  PLAN FOR NEXT SESSION: Nustep, PROM, VMS to left quadriceps. Progress per TKA protocol.  LE elevation and vasopneumatic.  Granville Lewis, PT 03/16/2023, 9:36 AM

## 2023-03-18 ENCOUNTER — Other Ambulatory Visit: Payer: Self-pay | Admitting: Family Medicine

## 2023-03-18 DIAGNOSIS — M109 Gout, unspecified: Secondary | ICD-10-CM

## 2023-03-21 ENCOUNTER — Encounter: Payer: Medicare PPO | Admitting: Physical Therapy

## 2023-03-23 ENCOUNTER — Ambulatory Visit: Payer: Medicare PPO | Admitting: *Deleted

## 2023-03-23 DIAGNOSIS — G8929 Other chronic pain: Secondary | ICD-10-CM

## 2023-03-23 DIAGNOSIS — R262 Difficulty in walking, not elsewhere classified: Secondary | ICD-10-CM | POA: Diagnosis not present

## 2023-03-23 DIAGNOSIS — M25562 Pain in left knee: Secondary | ICD-10-CM | POA: Diagnosis not present

## 2023-03-23 DIAGNOSIS — M25662 Stiffness of left knee, not elsewhere classified: Secondary | ICD-10-CM

## 2023-03-23 DIAGNOSIS — R600 Localized edema: Secondary | ICD-10-CM

## 2023-03-23 DIAGNOSIS — M6281 Muscle weakness (generalized): Secondary | ICD-10-CM | POA: Diagnosis not present

## 2023-03-23 NOTE — Therapy (Signed)
OUTPATIENT PHYSICAL THERAPY LOWER EXTREMITY TREATMENT   Patient Name: Timothy Estes MRN: [id number] DOB:01-Sep-1950, 72 y.o., male Today's Date: 03/23/2023  END OF SESSION:  PT End of Session - 03/23/23 0904     Visit Number 21    Number of Visits 24    Date for PT Re-Evaluation 03/30/23    Authorization Type FOTO.    PT Start Time 0845    PT Stop Time 0935    PT Time Calculation (min) 50 min                 Past Medical History:  Diagnosis Date   Anemia    Arthritis    Atrial fibrillation with RVR (HCC)    converted to NSR with IV dilt 10/12 - no coumadin due to need for Plavix and ASA   Cataract    Clotting disorder (HCC)    PE 2012   Colon polyps 10/2015   (non-adenomatous)   Coronary artery disease    LHC 03/17/11: Dx 30%, OM1 30%, OM2 30%, pRCA 30%, EF 40-45% with anterolat and apical HK   Diverticulosis 10/2015   noted on colonoscopy   Elevated LFTs    Fatty liver    Hematochezia    LIKEY FROM HEMORRHOIDAL BLEEDING; PT HAS H/O   Hyperlipidemia    Hypertension    Internal hemorrhoids 10/2015   noted on colonoscopy   NSTEMI (non-ST elevated myocardial infarction) (HCC)    2012-FLET THAT NON-ST-ELEVATION MI IS POSSIBLY SECONDARY TO CORONARY EMBOLUS FROM HIS AFIB   Syncope    in setting of AFib with RVR   Past Surgical History:  Procedure Laterality Date   CARDIAC CATHETERIZATION  03/18/11   cardiac event monitor  04/01/11   CATARACT EXTRACTION W/PHACO Left 09/18/2014   Procedure: CATARACT EXTRACTION PHACO AND INTRAOCULAR LENS PLACEMENT LEFT EYE;  Surgeon: Gemma Payor, MD;  Location: [facility];  Service: Ophthalmology;  Laterality: Left;  CDE 13.32   COLONOSCOPY     ESOPHAGOGASTRODUODENOSCOPY ENDOSCOPY     MOUTH SURGERY     TOTAL HIP ARTHROPLASTY Left 07/10/2018   Procedure: TOTAL HIP ARTHROPLASTY ANTERIOR APPROACH;  Surgeon: Durene Romans, MD;  Location: [facility];  Service: Orthopedics;  Laterality: Left;  70 mins   TOTAL KNEE ARTHROPLASTY Left 01/10/2023    Procedure: TOTAL KNEE ARTHROPLASTY;  Surgeon: Durene Romans, MD;  Location: [facility];  Service: Orthopedics;  Laterality: Left;   TRANSTHORACIC ECHOCARDIOGRAM  03/19/11   WISDOM TOOTH EXTRACTION     Patient Active Problem List   Diagnosis Date Noted   S/P total knee arthroplasty, left 01/10/2023   Obese 07/11/2018   S/P left THA 07/10/2018   Iron deficiency anemia 11/08/2017   Gout of knee 10/11/2016   Impaired fasting glucose 07/24/2014   Mixed hyperlipidemia 07/10/2012   Elevated LFTs 04/01/2011   NSTEMI (non-ST elevated myocardial infarction) (HCC) 04/01/2011   Atrial fibrillation (HCC) 04/01/2011   Essential hypertension 04/01/2011   HLD (hyperlipidemia) 04/01/2011   Nonobstructive CAD by Cath 02/2011 04/01/2011    REFERRING PROVIDER: Durene Romans MD  REFERRING DIAG: S/p left total knee replacement.  THERAPY DIAG:  Chronic pain of left knee  Stiffness of left knee, not elsewhere classified  Localized edema  Rationale for Evaluation and Treatment: Rehabilitation  ONSET DATE: 01/10/23.  SUBJECTIVE:   SUBJECTIVE STATEMENT:  Patient reports that his knee is really sore today due to slipping and  skinning-up LT knee   PERTINENT HISTORY: Left THA.  PAIN:  Are you having pain? Yes:  NPRS scale: 2-3/10 Pain location: Left knee. Pain description: Sore Aggravating factors: Knee ROM Relieving factors: Rest.  PRECAUTIONS: Other: No ultrasound.  FALLS:  Has patient fallen in last 6 months? Yes. Number of falls 2.  PATIENT GOALS: Get around better without knee pain.  Play drums.  OBJECTIVE: all objective measures were assessed at his initial evaluation on 01/13/23 unless otherwise noted  PATIENT SURVEYS:  FOTO    OBSERVATION: Aquacel removed by MD   EDEMA:  Circumferential: Left knee: 51.1 cm, Right knee: 44.2 cm  PALPATION: C/o diffuse left knee pain currently.  LOWER EXTREMITY ROM: In supine:  Left knee extension is -20 degrees and passive to -15 degrees  (right knee is -10 degrees).  Flexion achieved to 75 degrees.  LOWER EXTREMITY ROM:     Active/Passive   Left 02/02/23 02/16/23 02/23/23 03/09/23  Flexion  Post-stretch 90A/95P 95 degs Passive(100 degrees) 103/112 (PROM)  Extension   -25 degs -23 degrees -12 degrees   LOWER EXTREMITY MMT: Patient unable to perform a left antigravity SLR.  Left hip flexion and abduction graded grossly at 2-/5.  He is unable to perform a left SAQ.  TRANSFERS: Patient requires manual assist to transition from sit to supine and supine to sit.  GAIT:  Using rollator but L knee extension still greatly limited. None to minimal heel strike with gait.  TODAY'S TREATMENT:                                                                                                                              DATE:                                   03/23/23 EXERCISE LOG  Exercise Repetitions and Resistance Comments  Nustep L4 x 20 minutes; seat 10-9   Rocker board  5 minutes    Lunges  8" step x 3 minutes            Blank cell = exercise not performed today  Modalities: no redness or adverse reaction to today's modalities Manual extension stretching Date:  Vaso: Knee, 34 degrees; low pressure, 15 mins, Pain                                   03/14/23 EXERCISE LOG  Exercise Repetitions and Resistance Comments  NuStep  L4 x 19 minutes; seat 10-7   Standing gastroc stretch Rocker board 3 minutes    Lunges onto step  8" step x 3 minutes w/ 10 second hold  LLE only   Quad set   LLE only   Thomas stretch    LLE only   LAQ's 4# x 20 hold 5 secs   Low load prolonged hold stretch for knee extension     Blank cell = exercise not performed today  Manual Therapy Soft Tissue Mobilization: to LT ITB and HS,   Joint Mobilizations:  ,   Passive ROM: left knee flexion and extension, with overpressure to tolerance  x 8 mins Modalities  Date:  Vaso: Knee,  ,   mins, Pain                                   03/07/23 EXERCISE  LOG  Exercise Repetitions and Resistance Comments  Nustep  L4 x 18 minutes; seat 10,9,8   TRY seat 7 next Rx  Low load prolonged knee extension stretch    With vaso   Blank cell = exercise not performed today  Manual Therapy Soft Tissue Mobilization: left quadriceps, hip adductors, and hamstrings, for improved soft tissue extensibility Joint Mobilizations: left patella,   Passive ROM: flexion and extension, with overpressure to tolerance     Date:  Vaso: Knee,  ,   mins, Pain  PATIENT EDUCATION:  Education details: See below. Person educated: Patient, spouse Education method: Explanation, demo Education comprehension: verbalized understanding  HOME EXERCISE PROGRAM: HOME EXERCISE PROGRAM Created by [place] Applegate Oct 8th, 2024 View at [email or web address] using code: [email or web address]  Page 1 of 1 2 Exercises PRONE QUAD STRETCH WITH BELT OR STRAP Start by lying on your stomach with a strap or 2 belts linked together and looped it around your affected side ankle. Next, use the belt to pull the knee into a bent position allowing for a stretch as shown. Repeat 10 Times Hold 30 Seconds Complete 1 Set Perform 4 Times a Day PRONE KNEE HANGS While lying down on your stomach, allow your leg to hang off the end of a table/bed. Position yourself so that your knee cap is just over the end of the table/bed.  Just relax your body and allow gravity to stretch your knee into a more straightened position. Add an ankle weight for increased stretch. Repeat 1 Time Hold 10 Minutes Complete 1 Set Perform 3 Times a Day ASSESSMENT:  CLINICAL IMPRESSION: FOTO performed Pt arrived today with increased soreness due to slipping 6 days ago. Today's RX focused on ROM progression and was tolerated fairly well still. PROM to -12 degrees today. Vaso for edema control.     OBJECTIVE IMPAIRMENTS: Abnormal gait, decreased activity tolerance, decreased mobility, difficulty walking, decreased ROM, decreased  strength, increased edema, and pain.   ACTIVITY LIMITATIONS: carrying, lifting, bending, standing, stairs, transfers, bed mobility, dressing, and locomotion level  PARTICIPATION LIMITATIONS: meal prep, cleaning, laundry, driving, community activity, and yard work  PERSONAL FACTORS: Time since onset of injury/illness/exacerbation are also affecting patient's functional outcome.   REHAB POTENTIAL: Excellent  CLINICAL DECISION MAKING: Stable/uncomplicated  EVALUATION COMPLEXITY: Low  GOALS:  SHORT TERM GOALS: Target date: 01/27/23  Ind with an initial HEP. Goal status: MET  2.  Active left knee extension equal to right.   Baseline: 12 degrees from neutral on LLE Goal status: On going   LONG TERM GOALS: Target date: 02/24/23.  Ind with an advanced HEP.  Goal status: On going  2.  Active left knee flexion to 115 degrees+ so the patient can perform functional tasks and do so with pain not > 2-3/10.   Baseline: 103 degrees Goal status: On going  3.  Increase left hip and knee strength to a solid 4+/5 to provide good stability for accomplishment of functional activities.   Baseline:  Goal status: On going  4.  Perform a reciprocating stair gait with one railing with pain not > 2-3/10.   Baseline: utilizes step to pattern  Goal status: On going  5.  Return to playing the drums.  Goal status: MET  6.  Walk in clinic 250 feet without assistive device with a normal gait pattern.  Baseline: continues to utilize Rolator with gait deviations Goal status: On going  PLAN:  PT FREQUENCY:  2-3 times a week.  PT DURATION: 4 weeks  PLANNED INTERVENTIONS: Therapeutic exercises, Therapeutic activity, Neuromuscular re-education, Gait training, Patient/Family education, Self Care, Electrical stimulation, Cryotherapy, Vasopneumatic device, and Manual therapy  PLAN FOR NEXT SESSION: Nustep, PROM, VMS to left quadriceps. Progress per TKA protocol.  LE elevation and  vasopneumatic.  Shlome Baldree,CHRIS, PTA 03/23/2023, 9:50 AM

## 2023-03-28 ENCOUNTER — Ambulatory Visit: Payer: Medicare PPO | Attending: Orthopedic Surgery | Admitting: *Deleted

## 2023-03-28 ENCOUNTER — Encounter: Payer: Self-pay | Admitting: *Deleted

## 2023-03-28 DIAGNOSIS — R600 Localized edema: Secondary | ICD-10-CM | POA: Insufficient documentation

## 2023-03-28 DIAGNOSIS — R262 Difficulty in walking, not elsewhere classified: Secondary | ICD-10-CM | POA: Diagnosis not present

## 2023-03-28 DIAGNOSIS — M25611 Stiffness of right shoulder, not elsewhere classified: Secondary | ICD-10-CM | POA: Insufficient documentation

## 2023-03-28 DIAGNOSIS — G8929 Other chronic pain: Secondary | ICD-10-CM | POA: Diagnosis not present

## 2023-03-28 DIAGNOSIS — M25662 Stiffness of left knee, not elsewhere classified: Secondary | ICD-10-CM | POA: Diagnosis not present

## 2023-03-28 DIAGNOSIS — M6281 Muscle weakness (generalized): Secondary | ICD-10-CM | POA: Diagnosis not present

## 2023-03-28 DIAGNOSIS — M25562 Pain in left knee: Secondary | ICD-10-CM | POA: Diagnosis not present

## 2023-03-28 NOTE — Therapy (Signed)
OUTPATIENT PHYSICAL THERAPY LOWER EXTREMITY TREATMENT   Patient Name: Timothy Estes MRN: [id number] DOB:Apr 16, 1951, 72 y.o., male Today's Date: 03/28/2023  END OF SESSION:  PT End of Session - 03/28/23 0907     Visit Number 22    Number of Visits 24    Date for PT Re-Evaluation 03/30/23    Authorization Type FOTO.    PT Start Time 0845    PT Stop Time 0935    PT Time Calculation (min) 50 min                 Past Medical History:  Diagnosis Date   Anemia    Arthritis    Atrial fibrillation with RVR (HCC)    converted to NSR with IV dilt 10/12 - no coumadin due to need for Plavix and ASA   Cataract    Clotting disorder (HCC)    PE 2012   Colon polyps 10/2015   (non-adenomatous)   Coronary artery disease    LHC 03/17/11: Dx 30%, OM1 30%, OM2 30%, pRCA 30%, EF 40-45% with anterolat and apical HK   Diverticulosis 10/2015   noted on colonoscopy   Elevated LFTs    Fatty liver    Hematochezia    LIKEY FROM HEMORRHOIDAL BLEEDING; PT HAS H/O   Hyperlipidemia    Hypertension    Internal hemorrhoids 10/2015   noted on colonoscopy   NSTEMI (non-ST elevated myocardial infarction) (HCC)    2012-FLET THAT NON-ST-ELEVATION MI IS POSSIBLY SECONDARY TO CORONARY EMBOLUS FROM HIS AFIB   Syncope    in setting of AFib with RVR   Past Surgical History:  Procedure Laterality Date   CARDIAC CATHETERIZATION  03/18/11   cardiac event monitor  04/01/11   CATARACT EXTRACTION W/PHACO Left 09/18/2014   Procedure: CATARACT EXTRACTION PHACO AND INTRAOCULAR LENS PLACEMENT LEFT EYE;  Surgeon: Gemma Payor, MD;  Location: [facility];  Service: Ophthalmology;  Laterality: Left;  CDE 13.32   COLONOSCOPY     ESOPHAGOGASTRODUODENOSCOPY ENDOSCOPY     MOUTH SURGERY     TOTAL HIP ARTHROPLASTY Left 07/10/2018   Procedure: TOTAL HIP ARTHROPLASTY ANTERIOR APPROACH;  Surgeon: Durene Romans, MD;  Location: [facility];  Service: Orthopedics;  Laterality: Left;  70 mins   TOTAL KNEE ARTHROPLASTY Left 01/10/2023    Procedure: TOTAL KNEE ARTHROPLASTY;  Surgeon: Durene Romans, MD;  Location: [facility];  Service: Orthopedics;  Laterality: Left;   TRANSTHORACIC ECHOCARDIOGRAM  03/19/11   WISDOM TOOTH EXTRACTION     Patient Active Problem List   Diagnosis Date Noted   S/P total knee arthroplasty, left 01/10/2023   Obese 07/11/2018   S/P left THA 07/10/2018   Iron deficiency anemia 11/08/2017   Gout of knee 10/11/2016   Impaired fasting glucose 07/24/2014   Mixed hyperlipidemia 07/10/2012   Elevated LFTs 04/01/2011   NSTEMI (non-ST elevated myocardial infarction) (HCC) 04/01/2011   Atrial fibrillation (HCC) 04/01/2011   Essential hypertension 04/01/2011   HLD (hyperlipidemia) 04/01/2011   Nonobstructive CAD by Cath 02/2011 04/01/2011    REFERRING PROVIDER: Durene Romans MD    REFERRING DIAG: S/p left total knee replacement.  THERAPY DIAG:  Chronic pain of left knee  Stiffness of left knee, not elsewhere classified  Localized edema  Rationale for Evaluation and Treatment: Rehabilitation  ONSET DATE: 01/10/23.  SUBJECTIVE:   SUBJECTIVE STATEMENT:  Patient reports that his knee is doing better. Using cane for walking  PERTINENT HISTORY: Left THA.  PAIN:  Are you having pain? Yes: NPRS scale: 2-3/10 Pain  location: Left knee. Pain description: Sore Aggravating factors: Knee ROM Relieving factors: Rest.  PRECAUTIONS: Other: No ultrasound.  FALLS:  Has patient fallen in last 6 months? Yes. Number of falls 2.  PATIENT GOALS: Get around better without knee pain.  Play drums.  OBJECTIVE: all objective measures were assessed at his initial evaluation on 01/13/23 unless otherwise noted  PATIENT SURVEYS:  FOTO    OBSERVATION: Aquacel removed by MD   EDEMA:  Circumferential: Left knee: 51.1 cm, Right knee: 44.2 cm  PALPATION: C/o diffuse left knee pain currently.  LOWER EXTREMITY ROM: In supine:  Left knee extension is -20 degrees and passive to -15 degrees (right knee is -10  degrees).  Flexion achieved to 75 degrees.  LOWER EXTREMITY ROM:     Active/Passive   Left 02/02/23 02/16/23 02/23/23 03/09/23  Flexion  Post-stretch 90A/95P 95 degs Passive(100 degrees) 103/112 (PROM)  Extension   -25 degs -23 degrees -12 degrees   LOWER EXTREMITY MMT: Patient unable to perform a left antigravity SLR.  Left hip flexion and abduction graded grossly at 2-/5.  He is unable to perform a left SAQ.  TRANSFERS: Patient requires manual assist to transition from sit to supine and supine to sit.  GAIT:  Using rollator but L knee extension still greatly limited. None to minimal heel strike with gait.  TODAY'S TREATMENT:                                                                                                                              DATE:                                   03/28/23 EXERCISE LOG  Exercise Repetitions and Resistance Comments  Nustep L4 x 18 minutes; seat 10-7   Rocker board  5 minutes    Lunges     LAQ's 4#'s 2x10 hold 5 secs        Blank cell = exercise not performed today  Modalities: no redness or adverse reaction to today's modalities Manual extension stretching and STW to quads x12 mins  Date:  Vaso: Knee,  ,   mins, Pain ICE at home today                                      03/14/23 EXERCISE LOG  Exercise Repetitions and Resistance Comments  NuStep  L4 x 19 minutes; seat 10-7   Standing gastroc stretch Rocker board 3 minutes    Lunges onto step  8" step x 3 minutes w/ 10 second hold  LLE only   Quad set   LLE only   Thomas stretch    LLE only   LAQ's 4# x 20 hold 5 secs   Low load prolonged hold stretch for knee extension  Blank cell = exercise not performed today  Manual Therapy Soft Tissue Mobilization: to LT ITB and HS,   Joint Mobilizations:  ,   Passive ROM: left knee flexion and extension, with overpressure to tolerance  x 8 mins Modalities  Date:  Vaso: Knee,  ,   mins, Pain                                    03/07/23 EXERCISE LOG  Exercise Repetitions and Resistance Comments  Nustep  L4 x 18 minutes; seat 10,9,8   TRY seat 7 next Rx  Low load prolonged knee extension stretch    With vaso   Blank cell = exercise not performed today  Manual Therapy Soft Tissue Mobilization: left quadriceps, hip adductors, and hamstrings, for improved soft tissue extensibility Joint Mobilizations: left patella,   Passive ROM: flexion and extension, with overpressure to tolerance     Date:  Vaso: Knee,  ,   mins, Pain  PATIENT EDUCATION:  Education details: See below. Person educated: Patient, spouse Education method: Explanation, demo Education comprehension: verbalized understanding  HOME EXERCISE PROGRAM: HOME EXERCISE PROGRAM Created by [place] Applegate Oct 8th, 2024 View at [email or web address] using code: [email or web address]  Page 1 of 1 2 Exercises PRONE QUAD STRETCH WITH BELT OR STRAP Start by lying on your stomach with a strap or 2 belts linked together and looped it around your affected side ankle. Next, use the belt to pull the knee into a bent position allowing for a stretch as shown. Repeat 10 Times Hold 30 Seconds Complete 1 Set Perform 4 Times a Day PRONE KNEE HANGS While lying down on your stomach, allow your leg to hang off the end of a table/bed. Position yourself so that your knee cap is just over the end of the table/bed.  Just relax your body and allow gravity to stretch your knee into a more straightened position. Add an ankle weight for increased stretch. Repeat 1 Time Hold 10 Minutes Complete 1 Set Perform 3 Times a Day ASSESSMENT:  CLINICAL IMPRESSION:  Pt arrived today doing better than last visit. Today's RX focused again  on ROM progression  and Pt able to go to seat 7 and was tolerated fairly well still. PROM to -12 degrees today. V/C's to relax during PROM     OBJECTIVE IMPAIRMENTS: Abnormal gait, decreased activity tolerance, decreased mobility, difficulty walking,  decreased ROM, decreased strength, increased edema, and pain.   ACTIVITY LIMITATIONS: carrying, lifting, bending, standing, stairs, transfers, bed mobility, dressing, and locomotion level  PARTICIPATION LIMITATIONS: meal prep, cleaning, laundry, driving, community activity, and yard work  PERSONAL FACTORS: Time since onset of injury/illness/exacerbation are also affecting patient's functional outcome.   REHAB POTENTIAL: Excellent  CLINICAL DECISION MAKING: Stable/uncomplicated  EVALUATION COMPLEXITY: Low  GOALS:  SHORT TERM GOALS: Target date: 01/27/23  Ind with an initial HEP. Goal status: MET  2.  Active left knee extension equal to right.   Baseline: 12 degrees from neutral on LLE Goal status: On going   LONG TERM GOALS: Target date: 02/24/23.  Ind with an advanced HEP.  Goal status: On going  2.  Active left knee flexion to 115 degrees+ so the patient can perform functional tasks and do so with pain not > 2-3/10.   Baseline: 103 degrees Goal status: On going  3.  Increase left hip and knee strength to a solid 4+/5 to provide good stability for  accomplishment of functional activities.   Baseline:  Goal status: On going  4.  Perform a reciprocating stair gait with one railing with pain not > 2-3/10.   Baseline: utilizes step to pattern  Goal status: On going  5.  Return to playing the drums.  Goal status: MET  6.  Walk in clinic 250 feet without assistive device with a normal gait pattern.  Baseline: continues to utilize Rolator with gait deviations Goal status: On going  PLAN:  PT FREQUENCY:  2-3 times a week.  PT DURATION: 4 weeks  PLANNED INTERVENTIONS: Therapeutic exercises, Therapeutic activity, Neuromuscular re-education, Gait training, Patient/Family education, Self Care, Electrical stimulation, Cryotherapy, Vasopneumatic device, and Manual therapy  PLAN FOR NEXT SESSION: Therex  Sherece Gambrill,CHRIS, PTA 03/28/2023, 1:08 PM

## 2023-03-30 ENCOUNTER — Ambulatory Visit: Payer: Medicare PPO

## 2023-03-30 DIAGNOSIS — R600 Localized edema: Secondary | ICD-10-CM | POA: Diagnosis not present

## 2023-03-30 DIAGNOSIS — R262 Difficulty in walking, not elsewhere classified: Secondary | ICD-10-CM

## 2023-03-30 DIAGNOSIS — M25562 Pain in left knee: Secondary | ICD-10-CM | POA: Diagnosis not present

## 2023-03-30 DIAGNOSIS — M6281 Muscle weakness (generalized): Secondary | ICD-10-CM

## 2023-03-30 DIAGNOSIS — M25662 Stiffness of left knee, not elsewhere classified: Secondary | ICD-10-CM

## 2023-03-30 DIAGNOSIS — G8929 Other chronic pain: Secondary | ICD-10-CM | POA: Diagnosis not present

## 2023-03-30 DIAGNOSIS — M25611 Stiffness of right shoulder, not elsewhere classified: Secondary | ICD-10-CM

## 2023-03-30 NOTE — Therapy (Signed)
OUTPATIENT PHYSICAL THERAPY LOWER EXTREMITY TREATMENT   Patient Name: Timothy Estes MRN: [id number] DOB:[birth year], 72 y.o., male Today's Date: 03/30/2023  END OF SESSION:  PT End of Session - 03/30/23 0848     Visit Number 23    Number of Visits 24    Date for PT Re-Evaluation 03/30/23    Authorization Type FOTO.    PT Start Time 0845    PT Stop Time 0941    PT Time Calculation (min) 56 min    Activity Tolerance Patient tolerated treatment well    Behavior During Therapy WFL for tasks assessed/performed                  Past Medical History:  Diagnosis Date   Anemia    Arthritis    Atrial fibrillation with RVR (HCC)    converted to NSR with IV dilt 10/12 - no coumadin due to need for Plavix and ASA   Cataract    Clotting disorder (HCC)    PE 2012   Colon polyps 10/2015   (non-adenomatous)   Coronary artery disease    LHC 03/17/11: Dx 30%, OM1 30%, OM2 30%, pRCA 30%, EF 40-45% with anterolat and apical HK   Diverticulosis 10/2015   noted on colonoscopy   Elevated LFTs    Fatty liver    Hematochezia    LIKEY FROM HEMORRHOIDAL BLEEDING; PT HAS H/O   Hyperlipidemia    Hypertension    Internal hemorrhoids 10/2015   noted on colonoscopy   NSTEMI (non-ST elevated myocardial infarction) (HCC)    2012-FLET THAT NON-ST-ELEVATION MI IS POSSIBLY SECONDARY TO CORONARY EMBOLUS FROM HIS AFIB   Syncope    in setting of AFib with RVR   Past Surgical History:  Procedure Laterality Date   CARDIAC CATHETERIZATION  03/18/11   cardiac event monitor  04/01/11   CATARACT EXTRACTION W/PHACO Left 09/18/2014   Procedure: CATARACT EXTRACTION PHACO AND INTRAOCULAR LENS PLACEMENT LEFT EYE;  Surgeon: Gemma Payor, MD;  Location: [facility];  Service: Ophthalmology;  Laterality: Left;  CDE 13.32   COLONOSCOPY     ESOPHAGOGASTRODUODENOSCOPY ENDOSCOPY     MOUTH SURGERY     TOTAL HIP ARTHROPLASTY Left 07/10/2018   Procedure: TOTAL HIP ARTHROPLASTY ANTERIOR APPROACH;  Surgeon: Durene Romans,  MD;  Location: [facility];  Service: Orthopedics;  Laterality: Left;  70 mins   TOTAL KNEE ARTHROPLASTY Left 01/10/2023   Procedure: TOTAL KNEE ARTHROPLASTY;  Surgeon: Durene Romans, MD;  Location: [facility];  Service: Orthopedics;  Laterality: Left;   TRANSTHORACIC ECHOCARDIOGRAM  03/19/11   WISDOM TOOTH EXTRACTION     Patient Active Problem List   Diagnosis Date Noted   S/P total knee arthroplasty, left 01/10/2023   Obese 07/11/2018   S/P left THA 07/10/2018   Iron deficiency anemia 11/08/2017   Gout of knee 10/11/2016   Impaired fasting glucose 07/24/2014   Mixed hyperlipidemia 07/10/2012   Elevated LFTs 04/01/2011   NSTEMI (non-ST elevated myocardial infarction) (HCC) 04/01/2011   Atrial fibrillation (HCC) 04/01/2011   Essential hypertension 04/01/2011   HLD (hyperlipidemia) 04/01/2011   Nonobstructive CAD by Cath 02/2011 04/01/2011    REFERRING PROVIDER: Durene Romans MD    REFERRING DIAG: S/p left total knee replacement.  THERAPY DIAG:  Chronic pain of left knee  Stiffness of left knee, not elsewhere classified  Stiffness of right shoulder, not elsewhere classified  Localized edema  Muscle weakness (generalized)  Difficulty in walking, not elsewhere classified  Rationale for Evaluation and Treatment: Rehabilitation  ONSET DATE: 01/10/23.  SUBJECTIVE:   SUBJECTIVE STATEMENT:  Patient reports that his knee feels alright today.   PERTINENT HISTORY: Left THA.  PAIN:  Are you having pain? Yes: NPRS scale: 1-2/10 Pain location: Left knee. Pain description: Sore Aggravating factors: Knee ROM Relieving factors: Rest.  PRECAUTIONS: Other: No ultrasound.  FALLS:  Has patient fallen in last 6 months? Yes. Number of falls 2.  PATIENT GOALS: Get around better without knee pain.  Play drums.  OBJECTIVE: all objective measures were assessed at his initial evaluation on 01/13/23 unless otherwise noted  PATIENT SURVEYS:  FOTO    OBSERVATION: Aquacel removed by  MD   EDEMA:  Circumferential: Left knee: 51.1 cm, Right knee: 44.2 cm  PALPATION: C/o diffuse left knee pain currently.  LOWER EXTREMITY ROM: In supine:  Left knee extension is -20 degrees and passive to -15 degrees (right knee is -10 degrees).  Flexion achieved to 75 degrees.  LOWER EXTREMITY ROM:     Active/Passive   Left 02/02/23 02/16/23 02/23/23 03/09/23  Flexion  Post-stretch 90A/95P 95 degs Passive(100 degrees) 103/112 (PROM)  Extension   -25 degs -23 degrees -12 degrees   LOWER EXTREMITY MMT: Patient unable to perform a left antigravity SLR.  Left hip flexion and abduction graded grossly at 2-/5.  He is unable to perform a left SAQ.  TRANSFERS: Patient requires manual assist to transition from sit to supine and supine to sit.  GAIT:  Using rollator but L knee extension still greatly limited. None to minimal heel strike with gait.  TODAY'S TREATMENT:                                                                                                                              DATE:                                   03/30/23 EXERCISE LOG  Exercise Repetitions and Resistance Comments  Nustep  L5 x 19 minutes; seat 9-7   Rocker board 5 minutes    Lunges onto a step  8" step x 3 minutes  For left knee flexion  LAQ 4# x 3 x 10 reps w/ 5 second hold  LLE only   Thomas stretch  2.5 minutes  LLE only   Blank cell = exercise not performed today  Modalities: no adverse reaction to today's modalities  Date:  Vaso: Knee, 34 degrees; low pressure, 13 mins, Pain                                   03/28/23 EXERCISE LOG  Exercise Repetitions and Resistance Comments  Nustep L4 x 18 minutes; seat 10-7   Rocker board  5 minutes    Lunges     LAQ's 4#'s 2x10 hold 5 secs  Blank cell = exercise not performed today  Modalities: no redness or adverse reaction to today's modalities Manual extension stretching and STW to quads x12 mins  Date:  Vaso: Knee,  ,   mins, Pain ICE at home  today                                      03/14/23 EXERCISE LOG  Exercise Repetitions and Resistance Comments  NuStep  L4 x 19 minutes; seat 10-7   Standing gastroc stretch Rocker board 3 minutes    Lunges onto step  8" step x 3 minutes w/ 10 second hold  LLE only   Quad set   LLE only   Thomas stretch    LLE only   LAQ's 4# x 20 hold 5 secs   Low load prolonged hold stretch for knee extension     Blank cell = exercise not performed today  Manual Therapy Soft Tissue Mobilization: to LT ITB and HS,   Joint Mobilizations:  ,   Passive ROM: left knee flexion and extension, with overpressure to tolerance  x 8 mins Modalities  Date:  Vaso: Knee,  ,   mins, Pain  PATIENT EDUCATION:  Education details: See below. Person educated: Patient, spouse Education method: Explanation, demo Education comprehension: verbalized understanding  HOME EXERCISE PROGRAM: HOME EXERCISE PROGRAM Created by [place] Applegate Oct 8th, 2024 View at [email or web address] using code: [email or web address]  Page 1 of 1 2 Exercises PRONE QUAD STRETCH WITH BELT OR STRAP Start by lying on your stomach with a strap or 2 belts linked together and looped it around your affected side ankle. Next, use the belt to pull the knee into a bent position allowing for a stretch as shown. Repeat 10 Times Hold 30 Seconds Complete 1 Set Perform 4 Times a Day PRONE KNEE HANGS While lying down on your stomach, allow your leg to hang off the end of a table/bed. Position yourself so that your knee cap is just over the end of the table/bed.  Just relax your body and allow gravity to stretch your knee into a more straightened position. Add an ankle weight for increased stretch. Repeat 1 Time Hold 10 Minutes Complete 1 Set Perform 3 Times a Day ASSESSMENT:  CLINICAL IMPRESSION:  Today's treatment focused on familiar interventions for improved left knee mobility and strength needed for his functional activities. He required minimal  cueing with the thomas stretch to prevent muscle guarding to facilitate improved soft tissue extensibility. He experienced no significant increase in pain or discomfort with any of today's interventions. He reported that his knee felt "alright" upon the conclusion of treatment. He will be discharged to his HEP at his next appointment, as able.   OBJECTIVE IMPAIRMENTS: Abnormal gait, decreased activity tolerance, decreased mobility, difficulty walking, decreased ROM, decreased strength, increased edema, and pain.   ACTIVITY LIMITATIONS: carrying, lifting, bending, standing, stairs, transfers, bed mobility, dressing, and locomotion level  PARTICIPATION LIMITATIONS: meal prep, cleaning, laundry, driving, community activity, and yard work  PERSONAL FACTORS: Time since onset of injury/illness/exacerbation are also affecting patient's functional outcome.   REHAB POTENTIAL: Excellent  CLINICAL DECISION MAKING: Stable/uncomplicated  EVALUATION COMPLEXITY: Low  GOALS:  SHORT TERM GOALS: Target date: 01/27/23  Ind with an initial HEP. Goal status: MET  2.  Active left knee extension equal to right.   Baseline: 12 degrees from neutral on LLE Goal status: On going  LONG TERM GOALS: Target date: 02/24/23.  Ind with an advanced HEP.  Goal status: On going  2.  Active left knee flexion to 115 degrees+ so the patient can perform functional tasks and do so with pain not > 2-3/10.   Baseline: 103 degrees Goal status: On going  3.  Increase left hip and knee strength to a solid 4+/5 to provide good stability for accomplishment of functional activities.   Baseline:  Goal status: On going  4.  Perform a reciprocating stair gait with one railing with pain not > 2-3/10.   Baseline: utilizes step to pattern  Goal status: On going  5.  Return to playing the drums.  Goal status: MET  6.  Walk in clinic 250 feet without assistive device with a normal gait pattern.  Baseline: continues to utilize  Rolator with gait deviations Goal status: On going  PLAN:  PT FREQUENCY:  2-3 times a week.  PT DURATION: 4 weeks  PLANNED INTERVENTIONS: Therapeutic exercises, Therapeutic activity, Neuromuscular re-education, Gait training, Patient/Family education, Self Care, Electrical stimulation, Cryotherapy, Vasopneumatic device, and Manual therapy  PLAN FOR NEXT SESSION: Lewis Shock, PT 03/30/2023, 9:46 AM

## 2023-04-04 ENCOUNTER — Encounter: Payer: Self-pay | Admitting: *Deleted

## 2023-04-04 ENCOUNTER — Ambulatory Visit: Payer: Medicare PPO | Admitting: *Deleted

## 2023-04-04 DIAGNOSIS — M6281 Muscle weakness (generalized): Secondary | ICD-10-CM | POA: Diagnosis not present

## 2023-04-04 DIAGNOSIS — M25611 Stiffness of right shoulder, not elsewhere classified: Secondary | ICD-10-CM

## 2023-04-04 DIAGNOSIS — M25562 Pain in left knee: Secondary | ICD-10-CM | POA: Diagnosis not present

## 2023-04-04 DIAGNOSIS — G8929 Other chronic pain: Secondary | ICD-10-CM | POA: Diagnosis not present

## 2023-04-04 DIAGNOSIS — M25662 Stiffness of left knee, not elsewhere classified: Secondary | ICD-10-CM

## 2023-04-04 DIAGNOSIS — R600 Localized edema: Secondary | ICD-10-CM | POA: Diagnosis not present

## 2023-04-04 DIAGNOSIS — R262 Difficulty in walking, not elsewhere classified: Secondary | ICD-10-CM | POA: Diagnosis not present

## 2023-04-04 NOTE — Therapy (Addendum)
OUTPATIENT PHYSICAL THERAPY LOWER EXTREMITY TREATMENT   Patient Name: Timothy Estes MRN: [id number] DOB:1951/01/18, 72 y.o., male Today's Date: 04/04/2023  END OF SESSION:  PT End of Session - 04/04/23 0858     Visit Number 24    Number of Visits 24    Date for PT Re-Evaluation 03/30/23    Authorization Type FOTO.    PT Start Time 0845    PT Stop Time 0935    PT Time Calculation (min) 50 min                  Past Medical History:  Diagnosis Date   Anemia    Arthritis    Atrial fibrillation with RVR (HCC)    converted to NSR with IV dilt 10/12 - no coumadin due to need for Plavix and ASA   Cataract    Clotting disorder (HCC)    PE 2012   Colon polyps 10/2015   (non-adenomatous)   Coronary artery disease    LHC 03/17/11: Dx 30%, OM1 30%, OM2 30%, pRCA 30%, EF 40-45% with anterolat and apical HK   Diverticulosis 10/2015   noted on colonoscopy   Elevated LFTs    Fatty liver    Hematochezia    LIKEY FROM HEMORRHOIDAL BLEEDING; PT HAS H/O   Hyperlipidemia    Hypertension    Internal hemorrhoids 10/2015   noted on colonoscopy   NSTEMI (non-ST elevated myocardial infarction) (HCC)    2012-FLET THAT NON-ST-ELEVATION MI IS POSSIBLY SECONDARY TO CORONARY EMBOLUS FROM HIS AFIB   Syncope    in setting of AFib with RVR   Past Surgical History:  Procedure Laterality Date   CARDIAC CATHETERIZATION  03/18/11   cardiac event monitor  04/01/11   CATARACT EXTRACTION W/PHACO Left 09/18/2014   Procedure: CATARACT EXTRACTION PHACO AND INTRAOCULAR LENS PLACEMENT LEFT EYE;  Surgeon: Gemma Payor, MD;  Location: [facility];  Service: Ophthalmology;  Laterality: Left;  CDE 13.32   COLONOSCOPY     ESOPHAGOGASTRODUODENOSCOPY ENDOSCOPY     MOUTH SURGERY     TOTAL HIP ARTHROPLASTY Left 07/10/2018   Procedure: TOTAL HIP ARTHROPLASTY ANTERIOR APPROACH;  Surgeon: Durene Romans, MD;  Location: [facility];  Service: Orthopedics;  Laterality: Left;  70 mins   TOTAL KNEE ARTHROPLASTY Left 01/10/2023    Procedure: TOTAL KNEE ARTHROPLASTY;  Surgeon: Durene Romans, MD;  Location: [facility];  Service: Orthopedics;  Laterality: Left;   TRANSTHORACIC ECHOCARDIOGRAM  03/19/11   WISDOM TOOTH EXTRACTION     Patient Active Problem List   Diagnosis Date Noted   S/P total knee arthroplasty, left 01/10/2023   Obese 07/11/2018   S/P left THA 07/10/2018   Iron deficiency anemia 11/08/2017   Gout of knee 10/11/2016   Impaired fasting glucose 07/24/2014   Mixed hyperlipidemia 07/10/2012   Elevated LFTs 04/01/2011   NSTEMI (non-ST elevated myocardial infarction) (HCC) 04/01/2011   Atrial fibrillation (HCC) 04/01/2011   Essential hypertension 04/01/2011   HLD (hyperlipidemia) 04/01/2011   Nonobstructive CAD by Cath 02/2011 04/01/2011    REFERRING PROVIDER: Durene Romans MD    REFERRING DIAG: S/p left total knee replacement.  THERAPY DIAG:  Chronic pain of left knee  Stiffness of left knee, not elsewhere classified  Stiffness of right shoulder, not elsewhere classified  Rationale for Evaluation and Treatment: Rehabilitation  ONSET DATE: 01/10/23.  SUBJECTIVE:   SUBJECTIVE STATEMENT:  Patient reports that his knee feels alright today and is ready for DC  PERTINENT HISTORY: Left THA.  PAIN:  Are you  having pain? Yes: NPRS scale: 1-2/10 Pain location: Left knee. Pain description: Sore Aggravating factors: Knee ROM Relieving factors: Rest.  PRECAUTIONS: Other: No ultrasound.  FALLS:  Has patient fallen in last 6 months? Yes. Number of falls 2.  PATIENT GOALS: Get around better without knee pain.  Play drums.  OBJECTIVE: all objective measures were assessed at his initial evaluation on 01/13/23 unless otherwise noted  PATIENT SURVEYS:  FOTO    OBSERVATION: Aquacel removed by MD   EDEMA:  Circumferential: Left knee: 51.1 cm, Right knee: 44.2 cm  PALPATION: C/o diffuse left knee pain currently.  LOWER EXTREMITY ROM: In supine:  Left knee extension is -20 degrees and passive  to -15 degrees (right knee is -10 degrees).  Flexion achieved to 75 degrees.  LOWER EXTREMITY ROM:     Active/Passive   Left 02/02/23 02/16/23 02/23/23 03/09/23  Flexion  Post-stretch 90A/95P 95 degs Passive(100 degrees) 103/112 (PROM)  Extension   -25 degs -23 degrees -12 degrees   LOWER EXTREMITY MMT: Patient unable to perform a left antigravity SLR.  Left hip flexion and abduction graded grossly at 2-/5.  He is unable to perform a left SAQ.  TRANSFERS: Patient requires manual assist to transition from sit to supine and supine to sit.  GAIT:  Using rollator but L knee extension still greatly limited. None to minimal heel strike with gait.  TODAY'S TREATMENT:                                                                                                                              DATE:                                   04/04/23 EXERCISE LOG  Exercise Repetitions and Resistance Comments  Nustep  L5 x 20 minutes; seat 9-7   Rocker board 5 minutes    Lunges onto a step  8" step x 3 minutes  For left knee flexion  LAQ  LLE only   Thomas stretch   LLE only   Blank cell = exercise not performed today  Modalities: no adverse reaction to today's modalities Manual PROM for flexion and extension   10-108 degrees today   x 10 mins Date:  Vaso: Knee,  ,   mins, Pain                                   03/28/23 EXERCISE LOG  Exercise Repetitions and Resistance Comments  Nustep L4 x 18 minutes; seat 10-7   Rocker board  5 minutes    Lunges     LAQ's 4#'s 2x10 hold 5 secs        Blank cell = exercise not performed today  Modalities: no redness or adverse reaction to today's modalities Manual extension stretching and STW to quads  x12 mins  Date:  Vaso: Knee,  ,   mins, Pain ICE at home today                                      03/14/23 EXERCISE LOG  Exercise Repetitions and Resistance Comments  NuStep  L4 x 19 minutes; seat 10-7   Standing gastroc stretch Rocker board 3 minutes     Lunges onto step  8" step x 3 minutes w/ 10 second hold  LLE only   Quad set   LLE only   Thomas stretch    LLE only   LAQ's 4# x 20 hold 5 secs   Low load prolonged hold stretch for knee extension     Blank cell = exercise not performed today  Manual Therapy Soft Tissue Mobilization: to LT ITB and HS,   Joint Mobilizations:  ,   Passive ROM: left knee flexion and extension, with overpressure to tolerance  x 8 mins Modalities  Date:  Vaso: Knee,  ,   mins, Pain  PATIENT EDUCATION:  Education details: See below. Person educated: Patient, spouse Education method: Explanation, demo Education comprehension: verbalized understanding  HOME EXERCISE PROGRAM: HOME EXERCISE PROGRAM Created by [place] Applegate Oct 8th, 2024 View at [email or web address] using code: [email or web address]  Page 1 of 1 2 Exercises PRONE QUAD STRETCH WITH BELT OR STRAP Start by lying on your stomach with a strap or 2 belts linked together and looped it around your affected side ankle. Next, use the belt to pull the knee into a bent position allowing for a stretch as shown. Repeat 10 Times Hold 30 Seconds Complete 1 Set Perform 4 Times a Day PRONE KNEE HANGS While lying down on your stomach, allow your leg to hang off the end of a table/bed. Position yourself so that your knee cap is just over the end of the table/bed.  Just relax your body and allow gravity to stretch your knee into a more straightened position. Add an ankle weight for increased stretch. Repeat 1 Time Hold 10 Minutes Complete 1 Set Perform 3 Times a Day ASSESSMENT:  CLINICAL IMPRESSION:  Pt arrived today ambulating with [facility] and doing fairly well. Rx focused on ROM as well as function. Pt did great and was able to meet all LTG's except for flexion due to deficits. Extension goal was not met due to tightness as well. Pt reports being independent with HEP and ready to DC.    OBJECTIVE IMPAIRMENTS: Abnormal gait, decreased activity tolerance,  decreased mobility, difficulty walking, decreased ROM, decreased strength, increased edema, and pain.   ACTIVITY LIMITATIONS: carrying, lifting, bending, standing, stairs, transfers, bed mobility, dressing, and locomotion level  PARTICIPATION LIMITATIONS: meal prep, cleaning, laundry, driving, community activity, and yard work  PERSONAL FACTORS: Time since onset of injury/illness/exacerbation are also affecting patient's functional outcome.   REHAB POTENTIAL: Excellent  CLINICAL DECISION MAKING: Stable/uncomplicated  EVALUATION COMPLEXITY: Low  GOALS:  SHORT TERM GOALS: Target date: 01/27/23  Ind with an initial HEP. Goal status: MET  2.  Active left knee extension equal to right.   Baseline: 12 degrees from neutral on LLE Goal status: NM  -14   LONG TERM GOALS: Target date: 02/24/23.  Ind with an advanced HEP.  Goal status: MET  2.  Active left knee flexion to 115 degrees+ so the patient can perform functional tasks and do so with pain not > 2-3/10.  Baseline: 103 degrees Goal status: NM 108 degrees  3.  Increase left hip and knee strength to a solid 4+/5 to provide good stability for accomplishment of functional activities.   Baseline:  Goal status: MET  4.  Perform a reciprocating stair gait with one railing with pain not > 2-3/10.   Baseline: utilizes step to pattern  Goal status: MET  5.  Return to playing the drums.  Goal status: MET  6.  Walk in clinic 250 feet without assistive device with a normal gait pattern.  Baseline: continues to utilize Rolator with gait deviations Goal status: MET  PLAN:  PT FREQUENCY:  2-3 times a week.  PT DURATION: 4 weeks  PLANNED INTERVENTIONS: Therapeutic exercises, Therapeutic activity, Neuromuscular re-education, Gait training, Patient/Family education, Self Care, Electrical stimulation, Cryotherapy, Vasopneumatic device, and Manual therapy  PLAN FOR NEXT SESSION: DC to HEP  Mliss Wedin,CHRIS, PTA 04/04/2023, 3:36 PM    PHYSICAL THERAPY DISCHARGE SUMMARY  Visits from Start of Care: 24.  Current functional level related to goals / functional outcomes: See above.   Remaining deficits: Patient pleased with his overall progress but he did not meet his range of motion goals.     Education / Equipment: HEP.   Patient agrees to discharge. Patient goals were partially met. Patient is being discharged due to being pleased with the current functional level.    [place] Applegate MPT

## 2023-04-27 ENCOUNTER — Other Ambulatory Visit: Payer: Self-pay | Admitting: Family Medicine

## 2023-04-30 ENCOUNTER — Other Ambulatory Visit: Payer: Self-pay | Admitting: Family Medicine

## 2023-04-30 DIAGNOSIS — E782 Mixed hyperlipidemia: Secondary | ICD-10-CM

## 2023-04-30 DIAGNOSIS — I2510 Atherosclerotic heart disease of native coronary artery without angina pectoris: Secondary | ICD-10-CM

## 2023-05-18 ENCOUNTER — Other Ambulatory Visit: Payer: Self-pay | Admitting: Family Medicine

## 2023-05-18 DIAGNOSIS — E782 Mixed hyperlipidemia: Secondary | ICD-10-CM

## 2023-06-28 ENCOUNTER — Other Ambulatory Visit: Payer: Self-pay | Admitting: Family Medicine

## 2023-06-28 DIAGNOSIS — I2510 Atherosclerotic heart disease of native coronary artery without angina pectoris: Secondary | ICD-10-CM

## 2023-06-28 DIAGNOSIS — I10 Essential (primary) hypertension: Secondary | ICD-10-CM

## 2023-09-24 ENCOUNTER — Other Ambulatory Visit: Payer: Self-pay | Admitting: Family Medicine

## 2023-09-24 DIAGNOSIS — I2510 Atherosclerotic heart disease of native coronary artery without angina pectoris: Secondary | ICD-10-CM

## 2023-09-24 DIAGNOSIS — I10 Essential (primary) hypertension: Secondary | ICD-10-CM

## 2023-10-02 NOTE — Patient Instructions (Incomplete)
  HEALTH MAINTENANCE RECOMMENDATIONS:  It is recommended that you get at least 30 minutes of aerobic exercise at least 5 days/week (for weight loss, you may need as much as 60-90 minutes). This can be any activity that gets your heart rate up. This can be divided in 10-15 minute intervals if needed, but try and build up your endurance at least once a week.  Weight bearing exercise is also recommended twice weekly.  Eat a healthy diet with lots of vegetables, fruits and fiber.  "Colorful" foods have a lot of vitamins (ie green vegetables, tomatoes, red peppers, etc).  Limit sweet tea, regular sodas and alcoholic beverages, all of which has a lot of calories and sugar.  Up to 2 alcoholic drinks daily may be beneficial for men (unless trying to lose weight, watch sugars).  Drink a lot of water .  Sunscreen of at least SPF 30 should be used on all sun-exposed parts of the skin when outside between the hours of 10 am and 4 pm (not just when at beach or pool, but even with exercise, golf, tennis, and yard work!)  Use a sunscreen that says "broad spectrum" so it covers both UVA and UVB rays, and make sure to reapply every 1-2 hours.  Remember to change the batteries in your smoke detectors when changing your clock times in the spring and fall.  Carbon monoxide detectors are recommended for your home.  Use your seat belt every time you are in a car, and please drive safely and not be distracted with cell phones and texting while driving.    Timothy Estes , Thank you for taking time to come for your Medicare Wellness Visit. I appreciate your ongoing commitment to your health goals. Please review the following plan we discussed and let me know if I can assist you in the future.   This is a list of the screening recommended for you and due dates:  Health Maintenance  Topic Date Due   COVID-19 Vaccine (6 - 2024-25 season) 01/22/2023   Medicare Annual Wellness Visit  09/01/2023   Flu Shot  12/22/2023   Colon  Cancer Screening  11/11/2025   DTaP/Tdap/Td vaccine (3 - Td or Tdap) 11/16/2032   Pneumonia Vaccine  Completed   Hepatitis C Screening  Completed   Zoster (Shingles) Vaccine  Completed   HPV Vaccine  Aged Out   Meningitis B Vaccine  Aged Out   Continue to get COVID boosters. If you haven't had one since 12/2022, you can get one now. Get the new one when available this Fall.

## 2023-10-02 NOTE — Progress Notes (Unsigned)
 No chief complaint on file.   Timothy Estes is a 73 y.o. male who presents for a complete physical, Medicare annual wellness visit, and follow-up on chronic problems.    Hypertension follow-up:  He is compliant with metoprolol  25mg  BID and denies side effects. Denies headaches, dizziness, chest pain, edema, dyspnea with exertion. Blood pressure at home is running 128/80.   BP Readings from Last 3 Encounters:  03/08/23 132/82  01/11/23 128/71  12/28/22 (!) 194/95    H/o gout--he stopped taking allopurinol  after his physical in 2024 (uric acid level was 3.7, on 100mg  allopurinol  daily).  Repeat off the medication remained good at 5.9.  He hasn't had any gout flares since stopping the allopurinol , and continues to not drink any alcohol.   Hyperlipidemia: He continues on rosuvastatin , zetia , Niaspan  500 mg, and fish oil.  We decreased his niaspan  dose from 1000 mg to 500 mg after his physical in 2024 due to his labs being at goal, improved diet (with no further alcohol), and some flushing side effects.  His lipids remained good, and his flushing side effect had resolved on the lower dose. We had discussed trial of stopping the niaspan , but he preferred to stay on the lower dose, since tolerating it fine. Due for recheck now.  Denies changes to his diet-- His diet includes: 1% milk, has 2-3 eggs/week, cut back on cheese.  Red meat 1x/week.  Mostly lots of chicken, fried foods 0-2x/week, when out. He states he is now eating fried foods about once every 2 weeks.   Lab Results  Component Value Date   CHOL 140 10/25/2022   HDL 52 10/25/2022   LDLCALC 67 10/25/2022   TRIG 118 10/25/2022   CHOLHDL 2.7 10/25/2022    Elevated LFT's, probably NASH:  Ultrasound in the past showed fatty liver changes, last done in 2012. NASH FibroSURE testing indicated some fibrosis of the liver, with probable NASH (non-alcoholic steatohepatitis).  Repeat LFT's had improved somewhat. He no longer drinks any alcohol.     Lab Results  Component Value Date   ALT 30 09/01/2022   AST 51 (H) 09/01/2022   ALKPHOS 71 09/01/2022   BILITOT 0.7 09/01/2022    H/o Vitamin D  deficiency--level was low at 28 at his physical 02/2015. When taking 1000 IU daily, f/u level was normal in October 2017 (41).  He is currently taking 1000 IU daily.   Paroxysmal atrial fibrillation and coronary artery disease, status post non-ST elevation MI, with preserved left ventricular systolic function: He is on Xarelto  and ASA 81mg , and is on metoprolol . He has been maintaining sinus rhythm, as far as he can tell.   History of iron deficiency anemia.  EGD done 01/2018 showed H.pylori gastritis on gastric biopsies, reflux changes on esophageal biopsies.  He was treated by GI with Pylera and iron. He denies any further problems, denies abdominal pain, heartburn, melena or hematochezia. Anemia has resolved.  He continues on xarelto  and aspirin, and denies any bleeding.   Component Ref Range & Units (hover) 8 mo ago (01/11/23) 9 mo ago (12/28/22) 1 yr ago (09/01/22) 1 yr ago (02/17/22) 2 yr ago (08/12/21) 2 yr ago (02/03/21) 3 yr ago (08/03/20)  WBC 12.2 High  7.2 9.1 R 7.6 R 8.2 R 9.5 R 9.2 R  RBC 4.13 Low  5.05 5.21 R 5.05 R 5.31 R 5.04 R 5.15 R  Hemoglobin 12.3 Low  15.2 15.1 R 14.7 R 15.7 R 15.4 R 15.3 R  HCT 36.7 Low  44.7 45.9 R 44.8 R 46.1 R 44.5 R 44.8 R  MCV 88.9 88.5 88 R 89 R 87 R 88 R 87 R  MCH 29.8 30.1 29.0 R 29.1 R 29.6 R 30.6 R 29.7 R  MCHC 33.5 34.0 32.9 R 32.8 R 34.1 R 34.6 R 34.2 R  RDW 13.9 13.7 13.5 R 13.3 R 13.8 R 13.3 R 13.0 R  Platelets 145 Low  176 226 R 161 R 182 R 165 R 213 R     Immunization History  Administered Date(s) Administered   Fluad Quad(high Dose 65+) 02/18/2019, 02/03/2021, 02/17/2022   Fluad Trivalent(High Dose 65+) 03/08/2023   Influenza Split 03/15/2012   Influenza, High Dose Seasonal PF 04/03/2017, 03/30/2018   Influenza,inj,Quad PF,6+ Mos 01/23/2013, 01/22/2014, 02/24/2015, 03/10/2016    Influenza,inj,quad, With Preservative 02/20/2017   Moderna Covid-19 Fall Seasonal Vaccine 37yrs & older 04/11/2022   Moderna Covid-19 Vaccine Bivalent Booster 70yrs & up 04/26/2021   Moderna Sars-Covid-2 Vaccination 07/04/2019, 08/02/2019, 04/14/2020   PNEUMOCOCCAL CONJUGATE-20 11/02/2022   Pneumococcal Conjugate-13 09/22/2016   Pneumococcal Polysaccharide-23 10/30/2017   Respiratory Syncytial Virus Vaccine,Recomb Aduvanted(Arexvy) 04/25/2022   Tdap 07/10/2012, 11/17/2022   Zoster Recombinant(Shingrix) 12/09/2016, 04/04/2017   Zoster, Live 07/10/2012   Last colonoscopy: 10/2015 Dr. Sandrea Cruel (benign polyps, repeat 10 years) Last PSA:   Lab Results  Component Value Date   PSA1 3.1 09/01/2022   PSA1 1.8 08/12/2021   PSA1 0.8 11/04/2019   PSA 0.5 09/22/2016   PSA 0.54 09/03/2015   PSA 0.44 07/24/2014   Dentist: going regularly Ophtho: yearly Exercise:   Walking 30 minutes every day.  Has small hand weights.at home, hasn't been using them. Reports some lifting in his yardwork.   Patient Care Team: Roosvelt Colla, MD as PCP - General (Family Medicine) Tammie Fall, MD as PCP - Cardiology (Cardiology) Asencion Blacksmith, MD (Inactive) as Consulting Physician (Gastroenterology) Alexia Idler, OD (Optometry) Best, Landon Pinion, DMD (Oral Surgery) Claiborne Crew, MD as Consulting Physician (Orthopedic Surgery) Dentist: Dr. [facility]) Cataract Surgeon  Depression Screening: Flowsheet Row Office Visit from 09/01/2022 in [facility]  PHQ-2 Total Score 0        Falls screen:     09/01/2022    8:43 AM 02/17/2022   10:27 AM 08/12/2021    9:41 AM 02/03/2021    9:32 AM 08/03/2020    9:32 AM  Fall Risk   Falls in the past year? 0 0 0 0 1  Number falls in past yr: 0 0 0 0 0  Injury with Fall? 0 0 0 0 0  Comment     fell in house, hit the couch-no injury (more like a stumble)  Risk for fall due to : No Fall Risks No Fall Risks No Fall Risks No Fall Risks    Follow up Falls evaluation completed Falls evaluation completed Falls evaluation completed Falls evaluation completed      Functional Status Survey:         End of Life Discussion:  Patient does have a living will and medical power of attorney, scanned into his chart.    PMH, PSH, SH and FH reviewed and updated   ROS: The patient denies anorexia, fever, headaches, weight changes, decreased hearing, ear pain, hoarseness, chest pain, palpitations, dizziness, syncope, dyspnea on exertion, cough, swelling, nausea, vomiting, diarrhea, constipation, abdominal pain, melena, hematochezia, indigestion/heartburn, hematuria, incontinence, erectile dysfunction, nocturia, weakened urine stream, dysuria, genital lesions, numbness, tingling, weakness, tremor, suspicious skin lesions, depression, anxiety, abnormal bleeding/bruising (  just some bruising on forearms), or enlarged lymph nodes.  Denies memory issues. Denies fatigue. +snoring, but refreshed in mornings, no daytime somnolence. Denies any joint pains Decreased vision R eye (known cataract).--plans to have surgery soon. ***UPDATE   PHYSICAL EXAM:  There were no vitals taken for this visit.    Wt Readings from Last 3 Encounters:  03/08/23 221 lb 12.8 oz (100.6 kg)  01/10/23 212 lb 1.3 oz (96.2 kg)  12/28/22 212 lb (96.2 kg)     General Appearance:    Alert, cooperative, no distress, appears stated age.  Head:    Normocephalic, without obvious abnormality, atraumatic     Eyes:    PERRL, conjunctiva/corneas clear, EOM's intact, fundi normal on the left, not visualized on the R due to cataract   ***  Ears:    Normal TM's and external ear canals     Nose:    No drainage or sinus tenderness   Throat:    Normal mucosa. Poor dentition, some missing teeth  Neck:    Supple, no lymphadenopathy; thyroid: no enlargement/ tenderness/nodules; no carotid bruit or JVD     Back:    Spine nontender, no curvature, ROM normal, no CVA tenderness    Lungs:    Clear to auscultation bilaterally without wheezes, rales or ronchi; respirations unlabored.     Chest Wall:    No tenderness or deformity     Heart:    Regular rate and rhythm, S1 and S2 normal, 2-3/6 SE murmur, no rub or gallop.  Breast Exam:    No chest wall tenderness, masses or gynecomastia     Abdomen:    Soft, non-tender, nondistended, normoactive bowel sounds,   no masses, no hepatosplenomegaly     Genitalia:    normal external genitalia, circumsized, no lesions. Testicles are normal, no masses, no hernias   Rectal:    Normal sphincter tone, no masses.  No stool in vault for heme testing. ***  Non-inflamed tags present. Prostate is normal, not enlarged, no nodule.  Extremities:    No clubbing, cyanosis or edema.    Pulses:    2+ and symmetric all extremities     Skin:    Skin color, texture, turgor normal, no rashes or lesions. Purpura at left forearm  Lymph nodes:    Cervical, supraclavicular, and inguinal nodes normal     Neurologic:    Normal strength, sensation; eflexes 2+ and symmetric throughout                         Psych:   Normal mood, affect, hygiene and grooming  ***UPDATE R cataract, stool in vault?  Pupura/sin exam  ASSESSMENT/PLAN:  PSA, c-met, CBC, uric acid, lipid, iron panel with ferritin  Update surgical history if he had R cataract surgery done.  COVID booster--can get from pharmacy now if hasn't had, vs waiting until [facility].  Discussed PSA screening (risks/benefits), recommended at least 30 minutes of aerobic activity at least 5 days/week, weight-bearing exercise at least 2x/week; proper sunscreen use reviewed; healthy diet and alcohol recommendations (less than or equal to 2 drinks/day) reviewed; regular seatbelt use; changing batteries in smoke detectors. Immunization recommendations discussed--continue yearly flu shots.  COVID booster *** Colonoscopy recommendations reviewed--UTD.    MOST form reviewed, updated. Full Code, Full Care    Paulett Boros, MD   Medicare Attestation I have personally reviewed: The patient's medical and social history Their use of alcohol, tobacco or illicit drugs  Their current medications and supplements The patient's functional ability including ADLs,fall risks, home safety risks, cognitive, and hearing and visual impairment Diet and physical activities Evidence for depression or mood disorders   The patient's weight, height and BMI have been recorded in the chart.  I have made referrals, counseling, and provided education to the patient based on review of the above and I have provided the patient with a written personalized care plan for preventive services.

## 2023-10-04 ENCOUNTER — Encounter: Payer: Self-pay | Admitting: Family Medicine

## 2023-10-04 ENCOUNTER — Ambulatory Visit: Payer: Medicare PPO | Admitting: Family Medicine

## 2023-10-04 VITALS — BP 144/88 | HR 80 | Ht 69.0 in | Wt 235.0 lb

## 2023-10-04 DIAGNOSIS — R635 Abnormal weight gain: Secondary | ICD-10-CM

## 2023-10-04 DIAGNOSIS — E782 Mixed hyperlipidemia: Secondary | ICD-10-CM | POA: Diagnosis not present

## 2023-10-04 DIAGNOSIS — I480 Paroxysmal atrial fibrillation: Secondary | ICD-10-CM | POA: Diagnosis not present

## 2023-10-04 DIAGNOSIS — E6609 Other obesity due to excess calories: Secondary | ICD-10-CM

## 2023-10-04 DIAGNOSIS — Z125 Encounter for screening for malignant neoplasm of prostate: Secondary | ICD-10-CM | POA: Diagnosis not present

## 2023-10-04 DIAGNOSIS — I2510 Atherosclerotic heart disease of native coronary artery without angina pectoris: Secondary | ICD-10-CM | POA: Diagnosis not present

## 2023-10-04 DIAGNOSIS — K760 Fatty (change of) liver, not elsewhere classified: Secondary | ICD-10-CM | POA: Diagnosis not present

## 2023-10-04 DIAGNOSIS — M109 Gout, unspecified: Secondary | ICD-10-CM | POA: Diagnosis not present

## 2023-10-04 DIAGNOSIS — Z5181 Encounter for therapeutic drug level monitoring: Secondary | ICD-10-CM | POA: Diagnosis not present

## 2023-10-04 DIAGNOSIS — Z7901 Long term (current) use of anticoagulants: Secondary | ICD-10-CM | POA: Diagnosis not present

## 2023-10-04 DIAGNOSIS — E66811 Obesity, class 1: Secondary | ICD-10-CM

## 2023-10-04 DIAGNOSIS — I10 Essential (primary) hypertension: Secondary | ICD-10-CM | POA: Diagnosis not present

## 2023-10-04 DIAGNOSIS — Z6834 Body mass index (BMI) 34.0-34.9, adult: Secondary | ICD-10-CM

## 2023-10-04 DIAGNOSIS — Z0000 Encounter for general adult medical examination without abnormal findings: Secondary | ICD-10-CM | POA: Diagnosis not present

## 2023-10-04 LAB — LIPID PANEL

## 2023-10-04 MED ORDER — ROSUVASTATIN CALCIUM 40 MG PO TABS: 40.0000 mg | ORAL_TABLET | Freq: Every day | ORAL | 3 refills | Status: AC

## 2023-10-04 MED ORDER — EZETIMIBE 10 MG PO TABS
10.0000 mg | ORAL_TABLET | Freq: Every day | ORAL | 3 refills | Status: AC
Start: 2023-10-04 — End: ?

## 2023-10-04 MED ORDER — METOPROLOL TARTRATE 25 MG PO TABS
25.0000 mg | ORAL_TABLET | Freq: Two times a day (BID) | ORAL | 1 refills | Status: DC
Start: 2023-10-04 — End: 2024-04-02

## 2023-10-05 ENCOUNTER — Ambulatory Visit: Payer: Self-pay | Admitting: Family Medicine

## 2023-10-05 ENCOUNTER — Other Ambulatory Visit: Payer: Self-pay | Admitting: Internal Medicine

## 2023-10-05 DIAGNOSIS — R972 Elevated prostate specific antigen [PSA]: Secondary | ICD-10-CM

## 2023-10-05 LAB — IRON,TIBC AND FERRITIN PANEL
Ferritin: 72 ng/mL (ref 30–400)
Iron Saturation: 21 % (ref 15–55)
Iron: 98 ug/dL (ref 38–169)
Total Iron Binding Capacity: 469 ug/dL — ABNORMAL HIGH (ref 250–450)
UIBC: 371 ug/dL — ABNORMAL HIGH (ref 111–343)

## 2023-10-05 LAB — LIPID PANEL
Cholesterol, Total: 151 mg/dL (ref 100–199)
HDL: 44 mg/dL (ref >39)
LDL CALC COMMENT:: 3.4 ratio (ref 0.0–5.0)
LDL Chol Calc (NIH): 71 mg/dL (ref 0–99)
Triglycerides: 220 mg/dL — ABNORMAL HIGH (ref 0–149)
VLDL Cholesterol Cal: 36 mg/dL (ref 5–40)

## 2023-10-05 LAB — COMPREHENSIVE METABOLIC PANEL WITH GFR
ALT: 41 IU/L (ref 0–44)
AST: 46 IU/L — ABNORMAL HIGH (ref 0–40)
Albumin: 4.8 g/dL (ref 3.8–4.8)
Alkaline Phosphatase: 71 IU/L (ref 44–121)
BUN/Creatinine Ratio: 19 (ref 10–24)
BUN: 22 mg/dL (ref 8–27)
Bilirubin Total: 0.8 mg/dL (ref 0.0–1.2)
CO2: 18 mmol/L — ABNORMAL LOW (ref 20–29)
Calcium: 9.5 mg/dL (ref 8.6–10.2)
Chloride: 103 mmol/L (ref 96–106)
Creatinine, Ser: 1.18 mg/dL (ref 0.76–1.27)
Globulin, Total: 3.2 g/dL (ref 1.5–4.5)
Glucose: 93 mg/dL (ref 70–99)
Potassium: 4.4 mmol/L (ref 3.5–5.2)
Sodium: 140 mmol/L (ref 134–144)
Total Protein: 8 g/dL (ref 6.0–8.5)
eGFR: 66 mL/min/{1.73_m2} (ref >59)

## 2023-10-05 LAB — CBC WITH DIFFERENTIAL/PLATELET
Basophils Absolute: 0.1 10*3/uL (ref 0.0–0.2)
Basos: 1 %
EOS (ABSOLUTE): 0.5 10*3/uL — ABNORMAL HIGH (ref 0.0–0.4)
Eos: 5 %
Hematocrit: 44.8 % (ref 37.5–51.0)
Hemoglobin: 14.8 g/dL (ref 13.0–17.7)
Immature Grans (Abs): 0 10*3/uL (ref 0.0–0.1)
Immature Granulocytes: 0 %
Lymphocytes Absolute: 2.6 10*3/uL (ref 0.7–3.1)
Lymphs: 28 %
MCH: 29.6 pg (ref 26.6–33.0)
MCHC: 33 g/dL (ref 31.5–35.7)
MCV: 90 fL (ref 79–97)
Monocytes Absolute: 1 10*3/uL — ABNORMAL HIGH (ref 0.1–0.9)
Monocytes: 11 %
Neutrophils Absolute: 5 10*3/uL (ref 1.4–7.0)
Neutrophils: 55 %
Platelets: 217 10*3/uL (ref 150–450)
RBC: 5 x10E6/uL (ref 4.14–5.80)
RDW: 13.6 % (ref 11.6–15.4)
WBC: 9.1 10*3/uL (ref 3.4–10.8)

## 2023-10-05 LAB — PSA: Prostate Specific Ag, Serum: 4 ng/mL (ref 0.0–4.0)

## 2023-10-05 LAB — TSH: TSH: 3.57 u[IU]/mL (ref 0.450–4.500)

## 2023-10-05 LAB — URIC ACID: Uric Acid: 6.4 mg/dL (ref 3.8–8.4)

## 2023-10-05 NOTE — Progress Notes (Signed)
 Please advise pt of results-- Chem panel--normal sugar, electrolytes and kidney function.  One liver test remains elevated.  It is important to work on losing the weight he has gained, and avoid all alcohol. Blood counts are normal. Cholesterol panel--LDL remains very good.  The triglycerides are back up, probably a combination of his poor diet (sweet tea, fried foods--the things that made him gain the weight) and stopping the niacin .  He should resume the niacin  (stated he still had a bottle at home), and work on the diet as we discussed. The PSA is now 4--this is borderline high, but because it has increased significantly over the last 2-3 years, it is time for a consult with the urologist. Please put in referral to urologist (he might prefer the [facility] one in [place]) for evaluation of elevated PSA. Uric acid level is also higher than last check.  If not careful with his diet, he is at risk for having recurrent gout.  He needs to stop having any beer (that's what kept it down), or else he may need to restart the allopurinol .  I'll plan to recheck this in 6 months, but he should follow-up with us  if he develops any gout flares before then. Thyroid function is normal.

## 2023-10-09 ENCOUNTER — Other Ambulatory Visit: Payer: Self-pay | Admitting: Internal Medicine

## 2023-10-09 DIAGNOSIS — I480 Paroxysmal atrial fibrillation: Secondary | ICD-10-CM

## 2023-10-24 ENCOUNTER — Other Ambulatory Visit: Payer: Self-pay | Admitting: Family Medicine

## 2023-10-24 DIAGNOSIS — E782 Mixed hyperlipidemia: Secondary | ICD-10-CM

## 2023-11-27 ENCOUNTER — Ambulatory Visit: Admitting: Urology

## 2023-11-27 ENCOUNTER — Ambulatory Visit: Payer: Self-pay

## 2023-11-27 VITALS — BP 194/92 | HR 72

## 2023-11-27 DIAGNOSIS — R972 Elevated prostate specific antigen [PSA]: Secondary | ICD-10-CM | POA: Diagnosis not present

## 2023-11-27 DIAGNOSIS — R3129 Other microscopic hematuria: Secondary | ICD-10-CM | POA: Diagnosis not present

## 2023-11-27 LAB — URINALYSIS, ROUTINE W REFLEX MICROSCOPIC
Bilirubin, UA: NEGATIVE
Glucose, UA: NEGATIVE
Ketones, UA: NEGATIVE
Leukocytes,UA: NEGATIVE
Nitrite, UA: NEGATIVE
Specific Gravity, UA: 1.03 (ref 1.005–1.030)
Urobilinogen, Ur: 4 mg/dL — ABNORMAL HIGH (ref 0.2–1.0)
pH, UA: 6 (ref 5.0–7.5)

## 2023-11-27 LAB — MICROSCOPIC EXAMINATION: Bacteria, UA: NONE SEEN

## 2023-11-27 NOTE — Progress Notes (Addendum)
 11/27/2023 1:24 PM   Timothy Estes [birth year] [id number]  Referring provider: Randol Dawes, MD [address] [place],  [place] [address]  No chief complaint on file.   HPI:  New pt -   1) PSA elevation - PSA in 2023 was 1.8, then 3.1 in 73 and 4.0 in May 2025. No prior PSA elevation or bx. No FH. No h/o BPH. Older brother had PCa at age 72. IPSS 2.   Today, seen for the above. DRE was about 30 g. No dysuria or gross hematuria.   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   On xarelto  - a fib.  Dr. Danelle Birmingham    PMH: Past Medical History:  Diagnosis Date   Anemia    Arthritis    Atrial fibrillation with RVR (HCC)    converted to NSR with IV dilt 10/12 - no coumadin due to need for Plavix  and ASA   Cataract    Clotting disorder (HCC)    PE 2012   Colon polyps 10/2015   (non-adenomatous)   Coronary artery disease    LHC 03/17/11: Dx 30%, OM1 30%, OM2 30%, pRCA 30%, EF 40-45% with anterolat and apical HK   Diverticulosis 10/2015   noted on colonoscopy   Elevated LFTs    Fatty liver    Hematochezia    LIKEY FROM HEMORRHOIDAL BLEEDING; PT HAS H/O   Hyperlipidemia    Hypertension    Internal hemorrhoids 10/2015   noted on colonoscopy   NSTEMI (non-ST elevated myocardial infarction) (HCC)    2012-FLET THAT NON-ST-ELEVATION MI IS POSSIBLY SECONDARY TO CORONARY EMBOLUS FROM HIS AFIB   Syncope    in setting of AFib with RVR    Surgical History: Past Surgical History:  Procedure Laterality Date   CARDIAC CATHETERIZATION  03/18/11   cardiac event monitor  04/01/11   CATARACT EXTRACTION W/PHACO Left 09/18/2014   Procedure: CATARACT EXTRACTION PHACO AND INTRAOCULAR LENS PLACEMENT LEFT EYE;  Surgeon: Cherene Mania, MD;  Location: [facility];  Service: Ophthalmology;  Laterality: Left;  CDE 13.32   COLONOSCOPY     ESOPHAGOGASTRODUODENOSCOPY ENDOSCOPY     MOUTH SURGERY     TOTAL HIP ARTHROPLASTY Left 07/10/2018   Procedure: TOTAL HIP ARTHROPLASTY ANTERIOR APPROACH;  Surgeon: Ernie Cough,  MD;  Location: [facility];  Service: Orthopedics;  Laterality: Left;  70 mins   TOTAL KNEE ARTHROPLASTY Left 01/10/2023   Procedure: TOTAL KNEE ARTHROPLASTY;  Surgeon: Ernie Cough, MD;  Location: [facility];  Service: Orthopedics;  Laterality: Left;   TRANSTHORACIC ECHOCARDIOGRAM  03/19/11   WISDOM TOOTH EXTRACTION      Home Medications:  Allergies as of 11/27/2023   No Known Allergies      Medication List        Accurate as of November 27, 2023  1:24 PM. If you have any questions, ask your nurse or doctor.          aspirin EC 81 MG tablet Take 81 mg by mouth at bedtime. Swallow whole.   Coenzyme Q-10 100 MG capsule Take 100 mg by mouth in the morning.   ezetimibe  10 MG tablet Commonly known as: ZETIA  Take 1 tablet (10 mg total) by mouth daily.   metoprolol  tartrate 25 MG tablet Commonly known as: LOPRESSOR  Take 1 tablet (25 mg total) by mouth 2 (two) times daily.   niacin  500 MG ER tablet Commonly known as: VITAMIN B3 TAKE 1 TABLET BY MOUTH EVERYDAY AT BEDTIME   nitroGLYCERIN  0.4 MG SL tablet Commonly known  as: NITROSTAT  Place 1 tablet (0.4 mg total) under the tongue every 5 (five) minutes as needed for chest pain.   OMEGA-3 FATTY ACIDS PO Take 2,400 mg by mouth 2 (two) times daily.   rosuvastatin  40 MG tablet Commonly known as: CRESTOR  Take 1 tablet (40 mg total) by mouth daily.   VITAMIN D  PO Take 1,000 Units by mouth in the morning.   Xarelto  20 MG Tabs tablet Generic drug: rivaroxaban  TAKE 1 TABLET BY MOUTH DAILY WITH SUPPER        Allergies: No Known Allergies  Family History: Family History  Problem Relation Age of Onset   Hyperlipidemia Brother    Hypertension Brother    Arthritis Mother    Heart disease Neg Hx    Cancer Neg Hx    Diabetes Neg Hx    Colon cancer Neg Hx    Esophageal cancer Neg Hx    Rectal cancer Neg Hx    Stomach cancer Neg Hx     Social History:  reports that he quit smoking about 49 years ago. His smoking use included  cigarettes. He started smoking about 52 years ago. He has a 1.5 pack-year smoking history. He has quit using smokeless tobacco. He reports that he does not currently use alcohol. He reports that he does not use drugs.   Physical Exam: BP (!) 194/92   Pulse 72  , showed me on his phone 132/79 at his house earlier. No CP or HA.  Constitutional:  Alert and oriented, No acute distress. HEENT: [place] AT, moist mucus membranes.  Trachea midline, no masses. Cardiovascular: No clubbing, cyanosis, or edema. Respiratory: Normal respiratory effort, no increased work of breathing. GI: Abdomen is soft, nontender, nondistended, no abdominal masses GU: No CVA tenderness Lymph: No cervical or inguinal lymphadenopathy. Skin: No rashes, bruises or suspicious lesions. Neurologic: Grossly intact, no focal deficits, moving all 4 extremities. Psychiatric: Normal mood and affect. DRE: 30 g prostate and benign   Laboratory Data: Lab Results  Component Value Date   WBC 9.1 10/04/2023   HGB 14.8 10/04/2023   HCT 44.8 10/04/2023   MCV 90 10/04/2023   PLT 217 10/04/2023    Lab Results  Component Value Date   CREATININE 1.18 10/04/2023    Lab Results  Component Value Date   PSA 0.5 09/22/2016   PSA 0.54 09/03/2015   PSA 0.44 07/24/2014    No results found for: TESTOSTERONE  Lab Results  Component Value Date   HGBA1C 5.3 02/03/2021    Urinalysis    Component Value Date/Time   LABSPEC 1.020 10/30/2017 1416   BILIRUBINUR negative 10/30/2017 1416   BILIRUBINUR neg 09/03/2015 0946   KETONESUR negative 10/30/2017 1416   PROTEINUR negative 10/30/2017 1416   PROTEINUR neg 09/03/2015 0946   UROBILINOGEN negative 09/03/2015 0946   NITRITE Negative 10/30/2017 1416   NITRITE neg 09/03/2015 0946   LEUKOCYTESUR Negative 10/30/2017 1416    No results found for: LABMICR, WBCUA, RBCUA, LABEPIT, MUCUS, BACTERIA  Pertinent Imaging: N/a    Assessment & Plan:    PSA elevation - I had a  long discussion with the patient on the nature of elevated PSA - benign vs malignant causes. We discussed age specific levels and risk stratification. We discussed the nature risks and benefits of continued surveillance, other lab tests, imaging as well as prostate biopsy. We discussed the management of prostate cancer might include active surveillance or treatment depending on biopsy findings. All questions answered. Exam benign. Send PSA and  consider MRI vs biopsy. Prostate was ~ 30 g. Would need to stop xarelto .   MH - UA with 3-10 rbc. Recheck urine and consider MH eval which would also eval prostate.   ADD: repeat UA with 3-10 rbc. Ordered CT and will have pt f/u for cystoscopy.    No follow-ups on file.  Donnice Brooks, MD  [facility]  [address] [place], [place] [address] [phone]

## 2023-11-27 NOTE — Telephone Encounter (Signed)
-----   Message from Donnice Brooks sent at 11/27/2023  4:42 PM EDT ----- OK, let Darina know that after he left, his urinalysis returned and showed that he had some blood in his urine. This could be related to dehydration or an enlarged prostate but we need to recheck his  urine. A repeat UA will also help us  decide the best was to evaluate his PSA (CT or MRI). Have him come by the lab in the next 1-2 weeks and give another UA. Thanks!  ----- Message ----- From: Sammie Exie HERO, CMA Sent: 11/27/2023   3:53 PM EDT To: Donnice Brooks, MD  Not sure if you seen this morning  ----- Message ----- From: Interface, [facility] Lab Results In Sent: 11/27/2023   3:35 PM EDT To: [facility] [place] [facility]

## 2023-11-27 NOTE — Telephone Encounter (Signed)
 Called pt and given results from MD [place] pt voiced his understanding and was scheduled for an appt 07/17

## 2023-12-07 ENCOUNTER — Ambulatory Visit: Payer: Self-pay

## 2023-12-07 ENCOUNTER — Other Ambulatory Visit

## 2023-12-07 DIAGNOSIS — R3129 Other microscopic hematuria: Secondary | ICD-10-CM | POA: Diagnosis not present

## 2023-12-07 LAB — URINALYSIS, ROUTINE W REFLEX MICROSCOPIC
Bilirubin, UA: NEGATIVE
Glucose, UA: NEGATIVE
Ketones, UA: NEGATIVE
Leukocytes,UA: NEGATIVE
Nitrite, UA: NEGATIVE
Specific Gravity, UA: 1.025 (ref 1.005–1.030)
Urobilinogen, Ur: 1 mg/dL (ref 0.2–1.0)
pH, UA: 6.5 (ref 5.0–7.5)

## 2023-12-07 LAB — MICROSCOPIC EXAMINATION: Bacteria, UA: NONE SEEN

## 2023-12-11 NOTE — Addendum Note (Signed)
 Addended by: NIEVES COUGH R on: 12/11/2023 11:59 AM   Modules accepted: Orders

## 2023-12-29 ENCOUNTER — Ambulatory Visit (HOSPITAL_COMMUNITY)
Admission: RE | Admit: 2023-12-29 | Discharge: 2023-12-29 | Disposition: A | Discharge status: Home / Self Care | Source: Ambulatory Visit | Attending: Urology | Admitting: Urology

## 2023-12-29 DIAGNOSIS — R3129 Other microscopic hematuria: Secondary | ICD-10-CM | POA: Insufficient documentation

## 2023-12-29 DIAGNOSIS — K5730 Diverticulosis of large intestine without perforation or abscess without bleeding: Secondary | ICD-10-CM | POA: Diagnosis not present

## 2023-12-29 LAB — POCT I-STAT CREATININE: Creatinine, Ser: 1.1 mg/dL (ref 0.61–1.24)

## 2023-12-29 MED ORDER — IOHEXOL 300 MG/ML  SOLN
125.0000 mL | Freq: Once | INTRAMUSCULAR | Status: AC | PRN
  Administered 2023-12-29: 125 mL via INTRAVENOUS

## 2024-02-08 ENCOUNTER — Other Ambulatory Visit

## 2024-02-08 DIAGNOSIS — R972 Elevated prostate specific antigen [PSA]: Secondary | ICD-10-CM | POA: Diagnosis not present

## 2024-02-09 LAB — PSA: Prostate Specific Ag, Serum: 4.7 ng/mL — ABNORMAL HIGH (ref 0.0–4.0)

## 2024-02-09 NOTE — Telephone Encounter (Signed)
 Called and lvm for pt from MD [facility]

## 2024-02-09 NOTE — Telephone Encounter (Signed)
-----   Message from Donnice Brooks sent at 02/09/2024 12:00 PM EDT ----- Got it - he'll need cystoscopy 9/29. Thanks!  ----- Message ----- From: Sammie Exie HERO, CMA Sent: 02/09/2024   8:11 AM EDT To: Donnice Brooks, MD  Please review. Appt 09/29 ----- Message ----- From: Rebecka Memos Lab Results In Sent: 02/09/2024   5:38 AM EDT To: [facility] [place] [facility]

## 2024-02-19 ENCOUNTER — Ambulatory Visit: Admitting: Urology

## 2024-02-19 VITALS — BP 195/97 | HR 78

## 2024-02-19 DIAGNOSIS — R972 Elevated prostate specific antigen [PSA]: Secondary | ICD-10-CM | POA: Diagnosis not present

## 2024-02-19 DIAGNOSIS — R3129 Other microscopic hematuria: Secondary | ICD-10-CM | POA: Diagnosis not present

## 2024-02-19 LAB — URINALYSIS, ROUTINE W REFLEX MICROSCOPIC
Bilirubin, UA: NEGATIVE
Glucose, UA: NEGATIVE
Ketones, UA: NEGATIVE
Leukocytes,UA: NEGATIVE
Nitrite, UA: NEGATIVE
Specific Gravity, UA: 1.025 (ref 1.005–1.030)
Urobilinogen, Ur: 1 mg/dL (ref 0.2–1.0)
pH, UA: 7 (ref 5.0–7.5)

## 2024-02-19 LAB — MICROSCOPIC EXAMINATION: Bacteria, UA: NONE SEEN

## 2024-02-19 MED ORDER — CIPROFLOXACIN HCL 500 MG PO TABS
500.0000 mg | ORAL_TABLET | Freq: Once | ORAL | Status: AC
Start: 2024-02-19 — End: 2024-02-19
  Administered 2024-02-19: 500 mg via ORAL

## 2024-02-19 NOTE — Progress Notes (Signed)
  Timothy Estes  02/19/24  CC: No chief complaint on file.   HPI: F/u -   1) PSA elevation - PSA in 2023 was 1.8, then 3.1 in 2024 and 4.0 in May 2025. No prior PSA elevation or bx. No FH. No h/o BPH. Older brother had PCa at age 72. IPSS 2. Nl DRE JUL 2025, 30 g.   2) MH - UA 3-10 rbc noted on UA x 2 in JUL 2025.    Today, seen for the above - for cysto in eval MH and management of PSA elevation. His Aug 2025 CT a/p benign. Prostate small.  No LAD or bone lesions. PSA increased Sep 2025 PSA 4.7.  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    On xarelto  - a fib.  Dr. Danelle Birmingham   Blood pressure (!) 195/97, pulse 78. NED. A&Ox3.   No respiratory distress   Abd soft, NT, ND Normal phallus with bilateral descended testicles  Cystoscopy Procedure Note  Patient identification was confirmed, informed consent was obtained, and patient was prepped using Betadine  solution.  Lidocaine  jelly was administered per urethral meatus.     Pre-Procedure: - Inspection reveals a normal caliber ureteral meatus.  Procedure: The flexible cystoscope was introduced without difficulty - He had a mild bulbar stricture I dilated with a 14 French red rubber and then the scope passed.  Scope would initially not pass.  No other strictures or lesions. - prostate short and borderline obstructive - Normal bladder neck - Bilateral ureteral orifices identified - Bladder mucosa  reveals no ulcers, tumors, or lesions - No bladder stones - No trabeculation  Retroflexion shows normal bladder and bladder neck.   Post-Procedure: - Patient tolerated the procedure well  Reviewed CT images   Assessment/ Plan:  MH -benign evaluation.  PSA elevation - given increase and higher PSAD recommended TRUS bx. Discussed nature r/b/a to TRUS bx and he would like to continue surveillance.  We discussed that is reasonable, even if he had prostate cancer and a PSA of 4.7 that is low for restratification and we  discussed those ranges.  We also  discussed age-specific levels.  We will set him up for PSA in 4 months and then discussed biopsy at the start of the year if PSA higher.   No follow-ups on file.  Donnice Brooks, MD

## 2024-03-11 ENCOUNTER — Ambulatory Visit: Admitting: Urology

## 2024-03-22 ENCOUNTER — Encounter: Payer: Self-pay | Admitting: Internal Medicine

## 2024-03-22 ENCOUNTER — Ambulatory Visit: Attending: Internal Medicine | Admitting: Internal Medicine

## 2024-03-22 VITALS — BP 160/84 | HR 76 | Ht 70.0 in | Wt 234.2 lb

## 2024-03-22 DIAGNOSIS — I480 Paroxysmal atrial fibrillation: Secondary | ICD-10-CM

## 2024-03-22 DIAGNOSIS — I2510 Atherosclerotic heart disease of native coronary artery without angina pectoris: Secondary | ICD-10-CM | POA: Diagnosis not present

## 2024-03-22 MED ORDER — NITROGLYCERIN 0.4 MG SL SUBL: 0.4000 mg | SUBLINGUAL_TABLET | SUBLINGUAL | 6 refills | Status: AC | PRN

## 2024-03-22 NOTE — Progress Notes (Signed)
 HPI [name] returns today for followup. He is a pleasant 73 yo man with a h/o HTN, PAF, and CAD. He has undergone hip replacement without difficulty. He has not had chest pain. No edema. No sob. He checks his bp at home and it runs in the 120's on medical therapy.  He notes his SBP was 126 last night.  No Known Allergies   Current Outpatient Medications  Medication Sig Dispense Refill   aspirin 81 MG EC tablet Take 81 mg by mouth at bedtime. Swallow whole.     Coenzyme Q-10 100 MG capsule Take 100 mg by mouth in the morning.     ezetimibe  (ZETIA ) 10 MG tablet Take 1 tablet (10 mg total) by mouth daily. 90 tablet 3   metoprolol  tartrate (LOPRESSOR ) 25 MG tablet Take 1 tablet (25 mg total) by mouth 2 (two) times daily. 180 tablet 1   niacin  (VITAMIN B3) 500 MG ER tablet TAKE 1 TABLET BY MOUTH EVERYDAY AT BEDTIME 90 tablet 1   OMEGA-3 FATTY ACIDS PO Take 2,400 mg by mouth 2 (two) times daily.     rosuvastatin  (CRESTOR ) 40 MG tablet Take 1 tablet (40 mg total) by mouth daily. 90 tablet 3   VITAMIN D  PO Take 1,000 Units by mouth in the morning.     XARELTO  20 MG TABS tablet TAKE 1 TABLET BY MOUTH DAILY WITH SUPPER 90 tablet 1   nitroGLYCERIN  (NITROSTAT ) 0.4 MG SL tablet Place 1 tablet (0.4 mg total) under the tongue every 5 (five) minutes as needed for chest pain. 25 tablet 6   No current facility-administered medications for this visit.     Past Medical History:  Diagnosis Date   Anemia    Arthritis    Atrial fibrillation with RVR (HCC)    converted to NSR with IV dilt 10/12 - no coumadin due to need for Plavix  and ASA   Cataract    Clotting disorder    PE 2012   Colon polyps 10/2015   (non-adenomatous)   Coronary artery disease    LHC 03/17/11: Dx 30%, OM1 30%, OM2 30%, pRCA 30%, EF 40-45% with anterolat and apical HK   Diverticulosis 10/2015   noted on colonoscopy   Elevated LFTs    Fatty liver    Hematochezia    LIKEY FROM HEMORRHOIDAL BLEEDING; PT HAS H/O    Hyperlipidemia    Hypertension    Internal hemorrhoids 10/2015   noted on colonoscopy   NSTEMI (non-ST elevated myocardial infarction) (HCC)    2012-FLET THAT NON-ST-ELEVATION MI IS POSSIBLY SECONDARY TO CORONARY EMBOLUS FROM HIS AFIB   Syncope    in setting of AFib with RVR    ROS:   All systems reviewed and negative except as noted in the HPI.   Past Surgical History:  Procedure Laterality Date   CARDIAC CATHETERIZATION  03/18/11   cardiac event monitor  04/01/11   CATARACT EXTRACTION W/PHACO Left 09/18/2014   Procedure: CATARACT EXTRACTION PHACO AND INTRAOCULAR LENS PLACEMENT LEFT EYE;  Surgeon: Cherene Mania, MD;  Location: [facility];  Service: Ophthalmology;  Laterality: Left;  CDE 13.32   COLONOSCOPY     ESOPHAGOGASTRODUODENOSCOPY ENDOSCOPY     MOUTH SURGERY     TOTAL HIP ARTHROPLASTY Left 07/10/2018   Procedure: TOTAL HIP ARTHROPLASTY ANTERIOR APPROACH;  Surgeon: Ernie Cough, MD;  Location: [facility];  Service: Orthopedics;  Laterality: Left;  70 mins   TOTAL KNEE ARTHROPLASTY Left 01/10/2023   Procedure: TOTAL KNEE  ARTHROPLASTY;  Surgeon: Ernie Cough, MD;  Location: [facility];  Service: Orthopedics;  Laterality: Left;   TRANSTHORACIC ECHOCARDIOGRAM  03/19/11   WISDOM TOOTH EXTRACTION       Family History  Problem Relation Age of Onset   Hyperlipidemia Brother    Hypertension Brother    Arthritis Mother    Heart disease Neg Hx    Cancer Neg Hx    Diabetes Neg Hx    Colon cancer Neg Hx    Esophageal cancer Neg Hx    Rectal cancer Neg Hx    Stomach cancer Neg Hx      Social History   Socioeconomic History   Marital status: Married    Spouse name: Not on file   Number of children: 0   Years of education: Not on file   Highest education level: Not on file  Occupational History   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  He lost his job 02/2013;  Previously did survey work part-time (on the side)   Rescued a Daschund-Beagle mix      Updated 09/2023   Social Drivers of [employer]: Low Risk  (10/04/2023)   Overall Financial Resource Strain (CARDIA)    Difficulty of Paying Living Expenses: Not hard at all  Food Insecurity: No Food Insecurity (10/04/2023)   Hunger Vital Sign    Worried About Running Out of Food in the Last Year: Never true    Ran Out of Food in the Last Year: Never true  Transportation Needs: No Transportation Needs (10/04/2023)   PRAPARE - Administrator, Civil Service (Medical): No    Lack of Transportation (Non-Medical): No  Physical Activity: Sufficiently Active (10/04/2023)   Exercise Vital Sign    Days of Exercise per Week: 5 days    Minutes of Exercise per Session: 50 min  Stress: No Stress Concern Present (10/04/2023)   [employer] of Occupational Health - Occupational Stress Questionnaire    Feeling of Stress : Not at all  Social Connections: Moderately Integrated (10/04/2023)   Social Connection and Isolation Panel    Frequency of Communication with Friends and Family: More than three times a week    Frequency of Social Gatherings with Friends and Family: More than three times a week    Attends Religious Services: More than 4 times per year    Active Member of [employer] or Organizations: No    Attends Banker Meetings: Never    Marital Status: Married  Catering Manager Violence: Not At Risk (10/04/2023)    Humiliation, Afraid, Rape, and Kick questionnaire    Fear of Current or Ex-Partner: No    Emotionally Abused: No    Physically Abused: No    Sexually Abused:  No     BP (!) 170/82   Pulse 76   Ht 5' 10 (1.778 m)   Wt 234 lb 3.2 oz (106.2 kg)   SpO2 98%   BMI 33.60 kg/m   Physical Exam:  Well appearing NAD HEENT: Unremarkable Neck:  No JVD, no thyromegally Lymphatics:  No adenopathy Back:  No CVA tenderness Lungs:  Clear with no wheezes HEART:  Regular rate rhythm, no murmurs, no rubs, no clicks Abd:  soft, positive bowel sounds, no organomegally, no rebound, no guarding Ext:  2 plus pulses, no edema, no cyanosis, no clubbing Skin:  No rashes no nodules Neuro:  CN II through XII intact, motor grossly intact  EKG - nsr   Assess/Plan:  1.PAF - he has been asymptomatic since his last visit. He will continue Xarelto  and beta blocker. 2. CAD - he has been more sedentary. He is encouraged to increase his physical activity. 3. HTN - his bp is high today but at home it was 126. No change in meds. 4. Coags - he has had no bleedingg. Continue Xarelto .   Danelle Xzavian Semmel,MD

## 2024-03-22 NOTE — Patient Instructions (Signed)

## 2024-03-28 ENCOUNTER — Other Ambulatory Visit: Payer: Self-pay | Admitting: Family Medicine

## 2024-03-28 DIAGNOSIS — I2510 Atherosclerotic heart disease of native coronary artery without angina pectoris: Secondary | ICD-10-CM

## 2024-03-28 DIAGNOSIS — I10 Essential (primary) hypertension: Secondary | ICD-10-CM

## 2024-04-02 NOTE — Progress Notes (Unsigned)
 No chief complaint on file.  Patient presents for 6 month f/u on chronic problems.  02/19/24 he saw Dr. Nieves. He underwent cystoscopy to evaluate microscopic hematuria, and is being followed for elevated PSA. Cystoscopy was benign (he dilated a mild bulbar stricture).  They discussed his rising PSA's, declined biopsy, preferred continued surveillance. Plan was for a 4 month recheck (January), and poss biopsy if continues to increase.  Lab Results  Component Value Date   PSA1 4.7 (H) 02/08/2024   PSA1 4.0 10/04/2023   PSA1 3.1 09/01/2022   PSA 0.5 09/22/2016   PSA 0.54 09/03/2015   PSA 0.44 07/24/2014    Hypertension follow-up:  He is compliant with metoprolol  25mg  BID and denies side effects. Denies headaches, dizziness, chest pain, edema, dyspnea with exertion. Blood pressure at home is running *** Higher BP's have been noted at doctor visits. Home monitor was verified as accurate in 05/2019.   BP Readings from Last 3 Encounters:  03/22/24 (!) 160/84  02/19/24 (!) 195/97  11/27/23 (!) 194/92    H/o gout--he stopped taking allopurinol  after his physical in 2024 (uric acid level was 3.7, on 100mg  allopurinol  daily).   He hasn't had any gout flares since stopping the allopurinol , and continues to not drink any alcohol.  Lab Results  Component Value Date   LABURIC 6.4 10/04/2023    Hyperlipidemia: He continues on rosuvastatin , zetia , Niaspan  500 mg and fish oil.  Prior to his physical in 09/2023, we had done a trial of stopping his Niaspan  500 mg daily (had done well with decrease from 1000 mg, flushing had resolved). Unfortunately, his TG increased to 220 with the Niaspan , but he had also gained weight from eating related to poor diet (sweet tea, fried foods). He was advised to resume Niaspan  500 mg, along with his other medications. He is due for recheck today.   Denies changes to his diet-- His diet includes: 1% milk, has 2-3 eggs/week, cut back on cheese.  Red meat 1x/week.   Mostly lots of chicken.  He eats fried foods about 3-4 times/month (more when at the beach).  Sweet tea? ***  Lab Results  Component Value Date   CHOL 151 10/04/2023   HDL 44 10/04/2023   LDLCALC 71 10/04/2023   TRIG 220 (H) 10/04/2023   CHOLHDL 3.4 10/04/2023      Elevated LFT's, probably NASH:  Ultrasound in the past showed fatty liver changes, last done in 2012. NASH FibroSURE testing indicated some fibrosis of the liver, with probable NASH (non-alcoholic steatohepatitis).  Repeat LFT's had improved somewhat. He no longer drinks any alcohol.    Lab Results  Component Value Date   ALT 41 10/04/2023   AST 46 (H) 10/04/2023   ALKPHOS 71 10/04/2023   BILITOT 0.8 10/04/2023     Paroxysmal atrial fibrillation and coronary artery disease, status post non-ST elevation MI, with preserved left ventricular systolic function: He is on Xarelto  and is on metoprolol . He has been maintaining sinus rhythm, as far as he can tell.  He last saw Dr. Waddell on 03/22/24 and no changes were made.  He was encouraged to get more exercise.   History of iron deficiency anemia.  EGD done 01/2018 showed H.pylori gastritis on gastric biopsies, reflux changes on esophageal biopsies.  He was treated by GI with Pylera and iron. He denies any further problems, denies abdominal pain, heartburn, melena or hematochezia. Anemia has resolved.  He continues on xarelto .  Lab Results  Component Value Date  WBC 9.1 10/04/2023   HGB 14.8 10/04/2023   HCT 44.8 10/04/2023   MCV 90 10/04/2023   PLT 217 10/04/2023      PMH, PSH, SH reviewed   ROS:    PHYSICAL EXAM:  There were no vitals taken for this visit.  Wt Readings from Last 3 Encounters:  03/22/24 234 lb 3.2 oz (106.2 kg)  10/04/23 235 lb (106.6 kg)  03/08/23 221 lb 12.8 oz (100.6 kg)        ASSESSMENT/PLAN:  Covid booster  Have him bring list of BP's from home, including one from day of visit (running high at MD visits) Needs to be  FASTING Ensure that he is taking niaspan  500 mg  Lipids, cbc, c-met   F/u as scheduled in June 2026 for CPE, sooner prn.

## 2024-04-03 ENCOUNTER — Encounter: Payer: Self-pay | Admitting: Family Medicine

## 2024-04-03 ENCOUNTER — Ambulatory Visit: Payer: Self-pay | Admitting: Family Medicine

## 2024-04-03 VITALS — BP 130/72 | HR 72 | Ht 70.0 in | Wt 233.0 lb

## 2024-04-03 DIAGNOSIS — K760 Fatty (change of) liver, not elsewhere classified: Secondary | ICD-10-CM

## 2024-04-03 DIAGNOSIS — I10 Essential (primary) hypertension: Secondary | ICD-10-CM | POA: Diagnosis not present

## 2024-04-03 DIAGNOSIS — I480 Paroxysmal atrial fibrillation: Secondary | ICD-10-CM

## 2024-04-03 DIAGNOSIS — I2510 Atherosclerotic heart disease of native coronary artery without angina pectoris: Secondary | ICD-10-CM | POA: Diagnosis not present

## 2024-04-03 DIAGNOSIS — R7989 Other specified abnormal findings of blood chemistry: Secondary | ICD-10-CM | POA: Diagnosis not present

## 2024-04-03 DIAGNOSIS — E782 Mixed hyperlipidemia: Secondary | ICD-10-CM

## 2024-04-03 DIAGNOSIS — Z7901 Long term (current) use of anticoagulants: Secondary | ICD-10-CM | POA: Diagnosis not present

## 2024-04-03 DIAGNOSIS — Z5181 Encounter for therapeutic drug level monitoring: Secondary | ICD-10-CM | POA: Diagnosis not present

## 2024-04-03 DIAGNOSIS — E66811 Obesity, class 1: Secondary | ICD-10-CM | POA: Diagnosis not present

## 2024-04-03 DIAGNOSIS — Z6833 Body mass index (BMI) 33.0-33.9, adult: Secondary | ICD-10-CM

## 2024-04-03 DIAGNOSIS — E6609 Other obesity due to excess calories: Secondary | ICD-10-CM

## 2024-04-03 LAB — CBC WITH DIFFERENTIAL/PLATELET
Basophils Absolute: 0.1 x10E3/uL (ref 0.0–0.2)
Basos: 1 %
EOS (ABSOLUTE): 0.3 x10E3/uL (ref 0.0–0.4)
Eos: 3 %
Hematocrit: 43 % (ref 37.5–51.0)
Hemoglobin: 14.5 g/dL (ref 13.0–17.7)
Immature Grans (Abs): 0 x10E3/uL (ref 0.0–0.1)
Immature Granulocytes: 0 %
Lymphocytes Absolute: 2.2 x10E3/uL (ref 0.7–3.1)
Lymphs: 21 %
MCH: 30.2 pg (ref 26.6–33.0)
MCHC: 33.7 g/dL (ref 31.5–35.7)
MCV: 90 fL (ref 79–97)
Monocytes Absolute: 0.9 x10E3/uL (ref 0.1–0.9)
Monocytes: 9 %
Neutrophils Absolute: 6.8 x10E3/uL (ref 1.4–7.0)
Neutrophils: 66 %
Platelets: 211 x10E3/uL (ref 150–450)
RBC: 4.8 x10E6/uL (ref 4.14–5.80)
RDW: 13.6 % (ref 11.6–15.4)
WBC: 10.3 x10E3/uL (ref 3.4–10.8)

## 2024-04-03 LAB — COMPREHENSIVE METABOLIC PANEL WITH GFR
ALT: 29 IU/L (ref 0–44)
AST: 38 IU/L (ref 0–40)
Albumin: 4.6 g/dL (ref 3.8–4.8)
Alkaline Phosphatase: 60 IU/L (ref 47–123)
BUN/Creatinine Ratio: 17 (ref 10–24)
BUN: 22 mg/dL (ref 8–27)
Bilirubin Total: 0.9 mg/dL (ref 0.0–1.2)
CO2: 20 mmol/L (ref 20–29)
Calcium: 9.5 mg/dL (ref 8.6–10.2)
Chloride: 105 mmol/L (ref 96–106)
Creatinine, Ser: 1.33 mg/dL — ABNORMAL HIGH (ref 0.76–1.27)
Globulin, Total: 2.9 g/dL (ref 1.5–4.5)
Glucose: 93 mg/dL (ref 70–99)
Potassium: 4.1 mmol/L (ref 3.5–5.2)
Sodium: 140 mmol/L (ref 134–144)
Total Protein: 7.5 g/dL (ref 6.0–8.5)
eGFR: 57 mL/min/1.73 — ABNORMAL LOW (ref >59)

## 2024-04-03 LAB — LIPID PANEL
Chol/HDL Ratio: 3.5 ratio (ref 0.0–5.0)
Cholesterol, Total: 131 mg/dL (ref 100–199)
HDL: 37 mg/dL — ABNORMAL LOW (ref >39)
LDL Chol Calc (NIH): 62 mg/dL (ref 0–99)
Triglycerides: 192 mg/dL — ABNORMAL HIGH (ref 0–149)
VLDL Cholesterol Cal: 32 mg/dL (ref 5–40)

## 2024-04-03 MED ORDER — NIACIN ER (ANTIHYPERLIPIDEMIC) 500 MG PO TBCR: EXTENDED_RELEASE_TABLET | ORAL | 1 refills | Status: AC

## 2024-04-03 MED ORDER — METOPROLOL TARTRATE 25 MG PO TABS: 25.0000 mg | ORAL_TABLET | Freq: Two times a day (BID) | ORAL | 1 refills | Status: AC

## 2024-04-03 NOTE — Patient Instructions (Addendum)
 Be sure to write the pulses down on your list of blood pressures as well (since your medications slow the pulse).  Please cut back on fried foods in your diet. Eat more fruits and vegetables. Limit the breads/biscuits, rice, potatoes and pasta. Continue to avoid all sweetened beverages (tea, soda, juices). Drink mainly water .  We discussed limiting the portions in your meals (along with healthier food choices) in order to help get the weight back down. Continue to try and get at least 30 minutes of exercise daily.

## 2024-04-04 ENCOUNTER — Ambulatory Visit: Payer: Self-pay | Admitting: Family Medicine

## 2024-04-05 ENCOUNTER — Other Ambulatory Visit: Payer: Self-pay | Admitting: Pharmacist

## 2024-04-05 DIAGNOSIS — I480 Paroxysmal atrial fibrillation: Secondary | ICD-10-CM

## 2024-04-05 MED ORDER — RIVAROXABAN 20 MG PO TABS: 20.0000 mg | ORAL_TABLET | Freq: Every day | ORAL | 1 refills | Status: AC

## 2024-04-05 NOTE — Telephone Encounter (Signed)
 Prescription refill request for Xarelto  received.  Indication: a fib Last office visit: 03/22/24 Weight: 233# Age: 73 Scr: 1.33 epic 04/03/24 CrCl: 75 ml/min

## 2024-06-17 ENCOUNTER — Other Ambulatory Visit

## 2024-06-20 ENCOUNTER — Other Ambulatory Visit

## 2024-06-20 DIAGNOSIS — R972 Elevated prostate specific antigen [PSA]: Secondary | ICD-10-CM

## 2024-06-20 NOTE — Progress Notes (Signed)
 Timothy Estes                                          MRN: [id number]   06/20/2024   The [facility] Quality Team Specialist reviewed this patient medical record for the purposes of chart review for care gap closure. The following were reviewed: chart review for care gap closure-controlling blood pressure.    [facility] Quality Team

## 2024-06-21 ENCOUNTER — Ambulatory Visit: Payer: Self-pay

## 2024-06-21 LAB — PSA: Prostate Specific Ag, Serum: 6.4 ng/mL — ABNORMAL HIGH (ref 0.0–4.0)

## 2024-06-24 ENCOUNTER — Ambulatory Visit: Admitting: Urology

## 2024-06-28 ENCOUNTER — Telehealth: Payer: Self-pay

## 2024-06-28 DIAGNOSIS — R972 Elevated prostate specific antigen [PSA]: Secondary | ICD-10-CM

## 2024-06-28 MED ORDER — LEVOFLOXACIN 750 MG PO TABS: 750.0000 mg | ORAL_TABLET | Freq: Once | ORAL | 0 refills | Status: AC

## 2024-06-28 NOTE — Telephone Encounter (Signed)
 Attempted to call patient to get him scheduled for prostate biopsy lvm that I will send information via mychart and if he cannot make scheduled appt to return call to reschedule

## 2024-07-22 ENCOUNTER — Other Ambulatory Visit: Admitting: Urology

## 2024-07-22 ENCOUNTER — Ambulatory Visit (HOSPITAL_COMMUNITY)

## 2024-10-24 ENCOUNTER — Ambulatory Visit: Payer: Self-pay | Admitting: Family Medicine
